# Patient Record
Sex: Male | Born: 1965 | Marital: Single | Smoking: Never smoker
Health system: Southern US, Community
[De-identification: ages and names within clinical notes are randomized; demographics above are authoritative.]

## PROBLEM LIST (undated history)

## (undated) DIAGNOSIS — I10 Essential (primary) hypertension: Secondary | ICD-10-CM

## (undated) DIAGNOSIS — E119 Type 2 diabetes mellitus without complications: Secondary | ICD-10-CM

## (undated) DIAGNOSIS — E785 Hyperlipidemia, unspecified: Secondary | ICD-10-CM

## (undated) DIAGNOSIS — F79 Unspecified intellectual disabilities: Secondary | ICD-10-CM

---

## 2008-03-02 ENCOUNTER — Ambulatory Visit: Payer: Self-pay | Admitting: Family Medicine

## 2015-09-21 ENCOUNTER — Emergency Department: Payer: Medicaid Other

## 2015-09-21 ENCOUNTER — Inpatient Hospital Stay
Admission: EM | Admit: 2015-09-21 | Discharge: 2015-10-09 | DRG: 853 | Disposition: A | Discharge status: Home Health | Payer: Medicaid Other | Attending: Specialist | Admitting: Specialist

## 2015-09-21 ENCOUNTER — Encounter: Payer: Self-pay | Admitting: Emergency Medicine

## 2015-09-21 DIAGNOSIS — E872 Acidosis: Secondary | ICD-10-CM | POA: Diagnosis present

## 2015-09-21 DIAGNOSIS — E222 Syndrome of inappropriate secretion of antidiuretic hormone: Secondary | ICD-10-CM | POA: Diagnosis present

## 2015-09-21 DIAGNOSIS — Z794 Long term (current) use of insulin: Secondary | ICD-10-CM

## 2015-09-21 DIAGNOSIS — R739 Hyperglycemia, unspecified: Secondary | ICD-10-CM

## 2015-09-21 DIAGNOSIS — J90 Pleural effusion, not elsewhere classified: Secondary | ICD-10-CM

## 2015-09-21 DIAGNOSIS — Z09 Encounter for follow-up examination after completed treatment for conditions other than malignant neoplasm: Secondary | ICD-10-CM

## 2015-09-21 DIAGNOSIS — Z880 Allergy status to penicillin: Secondary | ICD-10-CM

## 2015-09-21 DIAGNOSIS — E860 Dehydration: Secondary | ICD-10-CM | POA: Diagnosis present

## 2015-09-21 DIAGNOSIS — Z8249 Family history of ischemic heart disease and other diseases of the circulatory system: Secondary | ICD-10-CM

## 2015-09-21 DIAGNOSIS — I10 Essential (primary) hypertension: Secondary | ICD-10-CM | POA: Diagnosis present

## 2015-09-21 DIAGNOSIS — E871 Hypo-osmolality and hyponatremia: Secondary | ICD-10-CM | POA: Insufficient documentation

## 2015-09-21 DIAGNOSIS — E1165 Type 2 diabetes mellitus with hyperglycemia: Secondary | ICD-10-CM | POA: Diagnosis present

## 2015-09-21 DIAGNOSIS — I248 Other forms of acute ischemic heart disease: Secondary | ICD-10-CM | POA: Diagnosis present

## 2015-09-21 DIAGNOSIS — Z79899 Other long term (current) drug therapy: Secondary | ICD-10-CM

## 2015-09-21 DIAGNOSIS — F79 Unspecified intellectual disabilities: Secondary | ICD-10-CM

## 2015-09-21 DIAGNOSIS — Z833 Family history of diabetes mellitus: Secondary | ICD-10-CM

## 2015-09-21 DIAGNOSIS — J918 Pleural effusion in other conditions classified elsewhere: Secondary | ICD-10-CM | POA: Diagnosis present

## 2015-09-21 DIAGNOSIS — Z9889 Other specified postprocedural states: Secondary | ICD-10-CM

## 2015-09-21 DIAGNOSIS — J189 Pneumonia, unspecified organism: Secondary | ICD-10-CM | POA: Insufficient documentation

## 2015-09-21 DIAGNOSIS — K5900 Constipation, unspecified: Secondary | ICD-10-CM | POA: Diagnosis not present

## 2015-09-21 DIAGNOSIS — J869 Pyothorax without fistula: Secondary | ICD-10-CM | POA: Insufficient documentation

## 2015-09-21 DIAGNOSIS — E876 Hypokalemia: Secondary | ICD-10-CM | POA: Diagnosis not present

## 2015-09-21 DIAGNOSIS — E11649 Type 2 diabetes mellitus with hypoglycemia without coma: Secondary | ICD-10-CM | POA: Diagnosis not present

## 2015-09-21 DIAGNOSIS — A419 Sepsis, unspecified organism: Secondary | ICD-10-CM | POA: Diagnosis present

## 2015-09-21 HISTORY — DX: Unspecified intellectual disabilities: F79

## 2015-09-21 HISTORY — DX: Type 2 diabetes mellitus without complications: E11.9

## 2015-09-21 HISTORY — DX: Essential (primary) hypertension: I10

## 2015-09-21 LAB — BASIC METABOLIC PANEL
Anion gap: 19 — ABNORMAL HIGH (ref 5–15)
BUN: 29 mg/dL — ABNORMAL HIGH (ref 6–20)
CHLORIDE: 90 mmol/L — AB (ref 101–111)
CO2: 18 mmol/L — ABNORMAL LOW (ref 22–32)
CREATININE: 1.13 mg/dL (ref 0.61–1.24)
Calcium: 9.1 mg/dL (ref 8.9–10.3)
Glucose, Bld: 340 mg/dL — ABNORMAL HIGH (ref 65–99)
POTASSIUM: 4.9 mmol/L (ref 3.5–5.1)
SODIUM: 127 mmol/L — AB (ref 135–145)

## 2015-09-21 LAB — URINALYSIS COMPLETE WITH MICROSCOPIC (ARMC ONLY)
Bilirubin Urine: NEGATIVE
Glucose, UA: >500 mg/dL — AB
LEUKOCYTES UA: NEGATIVE
NITRITE: NEGATIVE
PH: 6 (ref 5.0–8.0)
PROTEIN: 100 mg/dL — AB
SPECIFIC GRAVITY, URINE: 1.027 (ref 1.005–1.030)
Squamous Epithelial / LPF: NONE SEEN

## 2015-09-21 LAB — CBC
HEMATOCRIT: 40.3 % (ref 40.0–52.0)
Hemoglobin: 13.3 g/dL (ref 13.0–18.0)
MCH: 29.2 pg (ref 26.0–34.0)
MCHC: 32.9 g/dL (ref 32.0–36.0)
MCV: 88.8 fL (ref 80.0–100.0)
Platelets: 367 10*3/uL (ref 150–440)
RBC: 4.54 MIL/uL (ref 4.40–5.90)
RDW: 12.9 % (ref 11.5–14.5)
WBC: 14.9 10*3/uL — AB (ref 3.8–10.6)

## 2015-09-21 LAB — HEPATIC FUNCTION PANEL
ALBUMIN: 3.6 g/dL (ref 3.5–5.0)
ALT: 34 U/L (ref 17–63)
AST: 41 U/L (ref 15–41)
Alkaline Phosphatase: 69 U/L (ref 38–126)
BILIRUBIN DIRECT: 0.3 mg/dL (ref 0.1–0.5)
BILIRUBIN TOTAL: 1.1 mg/dL (ref 0.3–1.2)
Indirect Bilirubin: 0.8 mg/dL (ref 0.3–0.9)
Total Protein: 8.5 g/dL — ABNORMAL HIGH (ref 6.5–8.1)

## 2015-09-21 LAB — PROTIME-INR
INR: 1.18
Prothrombin Time: 15.2 seconds — ABNORMAL HIGH (ref 11.4–15.0)

## 2015-09-21 LAB — GLUCOSE, CAPILLARY: GLUCOSE-CAPILLARY: 321 mg/dL — AB (ref 65–99)

## 2015-09-21 LAB — LIPASE, BLOOD: Lipase: 12 U/L (ref 11–51)

## 2015-09-21 LAB — LACTIC ACID, PLASMA
Lactic Acid, Venous: 3.3 mmol/L (ref 0.5–2.0)
Lactic Acid, Venous: 2.6 mmol/L (ref 0.5–2.0)

## 2015-09-21 LAB — BRAIN NATRIURETIC PEPTIDE: B Natriuretic Peptide: 81 pg/mL (ref 0.0–100.0)

## 2015-09-21 LAB — RAPID INFLUENZA A&B ANTIGENS (ARMC ONLY): INFLUENZA A (ARMC): NEGATIVE

## 2015-09-21 LAB — RAPID INFLUENZA A&B ANTIGENS: Influenza B (ARMC): NEGATIVE

## 2015-09-21 LAB — TROPONIN I: Troponin I: 0.26 ng/mL — ABNORMAL HIGH (ref <0.031)

## 2015-09-21 MED ORDER — HYDROMORPHONE HCL 1 MG/ML IJ SOLN
0.5000 mg | Freq: Once | INTRAMUSCULAR | Status: AC
  Administered 2015-09-21: 0.5 mg via INTRAVENOUS

## 2015-09-21 MED ORDER — SODIUM CHLORIDE 0.9 % IV BOLUS (SEPSIS)
1000.0000 mL | Freq: Once | INTRAVENOUS | Status: AC
  Administered 2015-09-21: 1000 mL via INTRAVENOUS

## 2015-09-21 MED ORDER — ONDANSETRON HCL 4 MG/2ML IJ SOLN
4.0000 mg | Freq: Once | INTRAMUSCULAR | Status: AC
  Administered 2015-09-21: 4 mg via INTRAVENOUS
  Filled 2015-09-21: qty 2

## 2015-09-21 MED ORDER — ASPIRIN 81 MG PO CHEW
324.0000 mg | CHEWABLE_TABLET | Freq: Once | ORAL | Status: AC
  Administered 2015-09-21: 324 mg via ORAL
  Filled 2015-09-21: qty 4

## 2015-09-21 MED ORDER — MORPHINE SULFATE (PF) 4 MG/ML IV SOLN
4.0000 mg | Freq: Once | INTRAVENOUS | Status: AC
  Administered 2015-09-21: 4 mg via INTRAVENOUS
  Filled 2015-09-21: qty 1

## 2015-09-21 MED ORDER — LEVOFLOXACIN IN D5W 750 MG/150ML IV SOLN
750.0000 mg | Freq: Once | INTRAVENOUS | Status: AC
  Administered 2015-09-21: 750 mg via INTRAVENOUS
  Filled 2015-09-21: qty 150

## 2015-09-21 MED ORDER — ACETAMINOPHEN 500 MG PO TABS
500.0000 mg | ORAL_TABLET | Freq: Once | ORAL | Status: AC
  Administered 2015-09-21: 500 mg via ORAL
  Filled 2015-09-21: qty 1

## 2015-09-21 MED ORDER — IOPAMIDOL (ISOVUE-370) INJECTION 76%
100.0000 mL | Freq: Once | INTRAVENOUS | Status: AC | PRN
  Administered 2015-09-21: 100 mL via INTRAVENOUS

## 2015-09-21 MED ORDER — DIATRIZOATE MEGLUMINE & SODIUM 66-10 % PO SOLN
15.0000 mL | Freq: Once | ORAL | Status: AC
  Administered 2015-09-21: 15 mL via ORAL

## 2015-09-21 MED ORDER — HYDROMORPHONE HCL 1 MG/ML IJ SOLN
INTRAMUSCULAR | Status: AC
  Administered 2015-09-21: 0.5 mg via INTRAVENOUS
  Filled 2015-09-21: qty 1

## 2015-09-21 MED ORDER — VANCOMYCIN HCL IN DEXTROSE 1-5 GM/200ML-% IV SOLN
1000.0000 mg | Freq: Once | INTRAVENOUS | Status: AC
  Administered 2015-09-21: 1000 mg via INTRAVENOUS
  Filled 2015-09-21: qty 200

## 2015-09-21 NOTE — H&P (Addendum)
[facility] [place] [facility]   PATIENT NAME: Sean [name]    MR#:  [id number]  DATE OF BIRTH:  April 02, 1966  DATE OF ADMISSION:  09/21/2015  PRIMARY CARE PHYSICIAN: Emogene MorganAYCOCK, NGWE A, MD   REQUESTING/REFERRING PHYSICIAN: Governor Rooksebecca Lord, MD  CHIEF COMPLAINT:   Chief Complaint  Patient presents with  . Abdominal Pain    HISTORY OF PRESENT ILLNESS:  Sean [name]  is a 50 y.o. male with a known history of Hypertension and diabetes and mental retardation. The patient cannot provide any information. According to his brother, who was living with him, the patient got a flu from her sister 2 days ago. He started to have a fever, chills, cough with sputum, shortness of breath and chest pain. The patient is originally from [place], has been living here for 9 years. No recent history of traveling abroad. He was found fever with temperature 101-102, tachycardia at about 120 and leukocytosis 14.9. Chest x-ray show left sided pneumonia with cavity. He is treated with antibiotics in the ED. Since troponin is elevated at 0.26, he was treated with aspirin 1 dose in ED.  PAST MEDICAL HISTORY:   Past Medical History  Diagnosis Date  . Diabetes mellitus without complication (HCC)   . Hypertension   . Mentally disabled     PAST SURGICAL HISTORY:  History reviewed. No pertinent past surgical history. No surgical history.  SOCIAL HISTORY:   Social History  Substance Use Topics  . Smoking status: Never Smoker   . Smokeless tobacco: Not on file  . Alcohol Use: No    FAMILY HISTORY:   Family History  Problem Relation Age of Onset  . Hypertension Mother   . Diabetes Mother   . Hypertension Father   . Diabetes Father     DRUG ALLERGIES:   Allergies  Allergen Reactions  . Penicillins Other (See Comments)    Reaction:  Unknown     REVIEW OF SYSTEMS:  Unable to get ROS due to MR.  MEDICATIONS AT HOME:   Prior to Admission medications   Medication Sig Start  Date End Date Taking? Authorizing Provider  atorvastatin (LIPITOR) 20 MG tablet Take 20 mg by mouth every evening.   Yes Historical Provider, MD  insulin glargine (LANTUS) 100 UNIT/ML injection Inject 36 Units into the skin at bedtime.   Yes Historical Provider, MD  lisinopril (PRINIVIL,ZESTRIL) 10 MG tablet Take 10 mg by mouth daily.   Yes Historical Provider, MD  metFORMIN (GLUCOPHAGE) 1000 MG tablet Take 1,000 mg by mouth 2 (two) times daily with a meal.   Yes Historical Provider, MD      VITAL SIGNS:  Blood pressure 151/106, pulse 120, temperature 98.3 F (36.8 C), temperature source Oral, resp. rate 16, height 5\' 6"  (1.676 m), weight 57 kg (125 lb 10.6 oz), SpO2 91 %.  PHYSICAL EXAMINATION:  GENERAL:  50 y.o.-year-old patient lying in the bed with no acute distress. Lethargic and thin. EYES: Pupils equal, round, reactive to light and accommodation. No scleral icterus. Extraocular muscles intact.  HEENT: Head atraumatic, normocephalic. Oropharynx and nasopharynx clear. Dry oral mucosa. NECK:  Supple, no jugular venous distention. No thyroid enlargement, no tenderness.  LUNGS: Normal breath sounds bilaterally, no wheezing, but has bilateral crackles. No use of accessory muscles of respiration.  CARDIOVASCULAR: S1, S2 normal. No murmurs, rubs, or gallops.  ABDOMEN: Soft, nontender, nondistended. Bowel sounds present. No organomegaly or mass.  EXTREMITIES: No pedal edema, cyanosis, or clubbing.  NEUROLOGIC: Cranial nerves  II through XII are intact. Muscle strength 5/5 in all extremities. Sensation intact. Gait not checked.  PSYCHIATRIC: The patient is awake but has mental retardation.Marland Kitchen  SKIN: No obvious rash, lesion, or ulcer.   LABORATORY PANEL:   CBC  Recent Labs Lab 09/21/15 1319  WBC 14.9*  HGB 13.3  HCT 40.3  PLT 367   ------------------------------------------------------------------------------------------------------------------  Chemistries   Recent Labs Lab  09/21/15 1319  NA 127*  K 4.9  CL 90*  CO2 18*  GLUCOSE 340*  BUN 29*  CREATININE 1.13  CALCIUM 9.1  AST 41  ALT 34  ALKPHOS 69  BILITOT 1.1   ------------------------------------------------------------------------------------------------------------------  Cardiac Enzymes  Recent Labs Lab 09/21/15 1319  TROPONINI 0.26*   ------------------------------------------------------------------------------------------------------------------  RADIOLOGY:  Dg Chest 2 View  09/21/2015  CLINICAL DATA:  Left rib pain without reported injury. EXAM: CHEST  2 VIEW COMPARISON:  None. FINDINGS: The heart size and mediastinal contours are within normal limits. Right lung is clear. Mild to moderate left pleural effusion is noted with probable underlying atelectasis or infiltrate. The visualized skeletal structures are unremarkable. IMPRESSION: Mild to moderate left pleural effusion with probable underlying atelectasis or infiltrate. Electronically Signed   By: Lupita Raider, M.D.   On: 09/21/2015 15:03   Ct Angio Chest Pe W/cm &/or Wo Cm  09/21/2015  CLINICAL DATA:  Left-sided chest pain and shortness of breath. Left abdominal pain. EXAM: CT ANGIOGRAPHY CHEST CT ABDOMEN AND PELVIS WITH CONTRAST TECHNIQUE: Multidetector CT imaging of the chest was performed using the standard protocol during bolus administration of intravenous contrast. Multiplanar CT image reconstructions and MIPs were obtained to evaluate the vascular anatomy. Multidetector CT imaging of the abdomen and pelvis was performed using the standard protocol during bolus administration of intravenous contrast. CONTRAST:  100 cc Isovue 370 intravenous COMPARISON:  None. FINDINGS: CTA CHEST FINDINGS THORACIC INLET/BODY WALL: Subcutaneous reticulation in the left upper back is likely scarring. MEDIASTINUM: Normal heart size. No pericardial effusion. Atherosclerosis with notable proximal left subclavian artery stenosis due to low-density plaque  with positive remodeling. Stenosis measures 75% when narrowing as compared to downstream vessel lumen. 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 When accounting for intermittent motion and streak artifact there is no convincing pulmonary embolism. LUNG WINDOWS: Small left pleural effusion with loculation causing scalloping and fissural extension. There is opacification of the lingula and basilar left lower lobe with heterogeneous enhancement and the anterior basilar segment where there is air-filled cavities. No pleural nodularity or enhancement. OSSEOUS: No acute finding. CT ABDOMEN and PELVIS FINDINGS Abdominal wall:  No contributory findings. Hepatobiliary: No focal liver abnormality.No evidence of biliary obstruction or stone. Pancreas: Unremarkable. Spleen: Unremarkable. Adrenals/Urinary Tract: Prominent adrenal thickness enhancement without nodule. No hydronephrosis or stone. Distended bladder without wall thickening. Reproductive:Vas calcifications correlate with diabetes history. Stomach/Bowel:  No obstruction. No inflammation. Vascular/Lymphatic: Advanced for age aortic and iliac atherosclerosis. No acute vascular abnormality. No mass or adenopathy. Peritoneal: No ascites or pneumoperitoneum. Musculoskeletal: No acute abnormalities. Review of the MIP images confirms the above findings. IMPRESSION: 1. Cavitating left basilar pneumonia. Small to moderate parapneumonic effusion that is partially loculated. 2. Motion degraded CTA without evidence of pulmonary embolism. 3. Distended bladder, correlate for outlet obstruction. 4. Advanced for age atherosclerosis with high-grade proximal left subclavian artery stenosis. 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 Electronically Signed   By: Marnee Spring M.D.   On: 09/21/2015 16:58   Ct Abdomen Pelvis W Contrast  09/21/2015  CLINICAL DATA:  Left-sided chest  pain and shortness of breath. Left abdominal pain. EXAM: CT ANGIOGRAPHY CHEST CT ABDOMEN AND  PELVIS WITH CONTRAST TECHNIQUE: Multidetector CT imaging of the chest was performed using the standard protocol during bolus administration of intravenous contrast. Multiplanar CT image reconstructions and MIPs were obtained to evaluate the vascular anatomy. Multidetector CT imaging of the abdomen and pelvis was performed using the standard protocol during bolus administration of intravenous contrast. CONTRAST:  100 cc Isovue 370 intravenous COMPARISON:  None. FINDINGS: CTA CHEST FINDINGS THORACIC INLET/BODY WALL: Subcutaneous reticulation in the left upper back is likely scarring. MEDIASTINUM: Normal heart size. No pericardial effusion. Atherosclerosis with notable proximal left subclavian artery stenosis due to low-density plaque with positive remodeling. Stenosis measures 75% when narrowing as compared to downstream vessel lumen. 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 When accounting for intermittent motion and streak artifact there is no convincing pulmonary embolism. LUNG WINDOWS: Small left pleural effusion with loculation causing scalloping and fissural extension. There is opacification of the lingula and basilar left lower lobe with heterogeneous enhancement and the anterior basilar segment where there is air-filled cavities. No pleural nodularity or enhancement. OSSEOUS: No acute finding. CT ABDOMEN and PELVIS FINDINGS Abdominal wall:  No contributory findings. Hepatobiliary: No focal liver abnormality.No evidence of biliary obstruction or stone. Pancreas: Unremarkable. Spleen: Unremarkable. Adrenals/Urinary Tract: Prominent adrenal thickness enhancement without nodule. No hydronephrosis or stone. Distended bladder without wall thickening. Reproductive:Vas calcifications correlate with diabetes history. Stomach/Bowel:  No obstruction. No inflammation. Vascular/Lymphatic: Advanced for age aortic and iliac atherosclerosis. No acute vascular abnormality. No mass or adenopathy. Peritoneal: No ascites  or pneumoperitoneum. Musculoskeletal: No acute abnormalities. Review of the MIP images confirms the above findings. IMPRESSION: 1. Cavitating left basilar pneumonia. Small to moderate parapneumonic effusion that is partially loculated. 2. Motion degraded CTA without evidence of pulmonary embolism. 3. Distended bladder, correlate for outlet obstruction. 4. Advanced for age atherosclerosis with high-grade proximal left subclavian artery stenosis. 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 Electronically Signed   By: Marnee Spring M.D.   On: 09/21/2015 16:58    EKG:   Orders placed or performed during the hospital encounter of 09/21/15  . EKG 12-Lead  . EKG 12-Lead  . ED EKG within 10 minutes  . ED EKG within 10 minutes    IMPRESSION AND PLAN:   Sepsis with pneumonia (leukocytosis, tachycardia and fever) The patient will be admitted to medical floor. I will start sepsis protocol, continue vancomycin and Levaquin, pharmacy to dose. Follow-up blood culture and sputum culture, start nebulizer when necessary. Tinea oxygen and it cannula. Influenza PCR.  Lactic acidosis. Follow-up lactic acid level and supportive care. Elevated troponin. Possible due to demanding ischemia, follow-up troponin level, start aspirin and continue statin. Dehydration. Start normal saline IV and follow-up BMP. Hyponatremia. Continue normal saline IV and follow-up BMP. Diabetes. Start sliding scale and hold metformin. Hypertension. Continue lisinopril. Mental retardation.  All the records are reviewed and case discussed with ED provider. Management plans discussed with the patient's 2  brothers, next of kin, and he is in agreement. Patient has no POA.  CODE STATUS: Full code  TOTAL TIME TAKING CARE OF THIS PATIENT: 58 minutes.    Shaune Pollack M.D on 09/21/2015 at 5:58 PM  Between 7am to 6pm - Pager - [phone]  After 6pm go to [email or web address] - password [email or web address]  [facility] [place] [facility]  Office   [phone]  CC: Primary care physician; Emogene Morgan, MD

## 2015-09-21 NOTE — ED Notes (Signed)
Pt incontinent of urine. Bedding changed and pt cleaned

## 2015-09-21 NOTE — ED Provider Notes (Signed)
[facility] Emergency Department Provider Note   ____________________________________________  Time seen: Approximately 15 p.m. I have reviewed the triage vital signs and the triage nursing note.  HISTORY  Chief Complaint Abdominal Pain   Historian Limited history from the patient as he speaks URDI and has underlying mental retardation History is provided from the brother as well as a sister. Brother speaks excellent [place]. He states that he would be providing the history even if there was an interpreter, because the patient does not have the mental capacity to provide his own history.  HPI Sean [name] is a 50 y.o. male who takes insulin at home for hyperglycemia, who is been ill for approximately 3 days. The last 2 days he has been reportedly febrile to subjective touch and also temperature taken 101-102.  He has had a cough, but nonproductive. No significant chest pain or shortness of breath described. He's been complaining of left-sided chest/lower rib pain. He's had left-sided abdominal pain. No vomiting or diarrhea. Significant decreased by mouth intake for the past 2-3 days.  Brother took him to urgent care today and due to ketones in the urine was sent to the ED for further evaluation for possible ketoacidosis and workup for febrile illness.  Symptoms are moderate to severe.    Past Medical History  Diagnosis Date  . Diabetes mellitus without complication (HCC)   . Hypertension   . Mentally disabled     Patient Active Problem List   Diagnosis Date Noted  . Sepsis (HCC) 09/22/2015  . Pneumonia   . Hyponatremia     History reviewed. No pertinent past surgical history.  No current outpatient prescriptions on file. insulin   Allergies Penicillins Her brother, the patient had reportedly been penicillin allergic as a child. Family History  Problem Relation Age of Onset  . Hypertension Mother   . Diabetes Mother   . Hypertension Father   .  Diabetes Father     Social History Social History  Substance Use Topics  . Smoking status: Never Smoker   . Smokeless tobacco: None  . Alcohol Use: No    Review of Systems  Constitutional: Positive for fever. Eyes: Negative for visual changes. ENT: Negative for sore throat. Cardiovascular: Positive for chest pain, left rib.Marland Kitchen. Respiratory: Positive for shortness of breath. Gastrointestinal: Positive for abdominal pain, but negative for vomiting and diarrhea. Genitourinary: Negative for dysuria. Musculoskeletal: Positive for back pain in the left upper posterior area of the chest/back. Skin: Negative for rash. Neurological: Negative for headache. 10 point Review of Systems otherwise negative ____________________________________________   PHYSICAL EXAM:  VITAL SIGNS: ED Triage Vitals  Enc Vitals Group     BP 09/21/15 1306 173/62 mmHg     Pulse Rate 09/21/15 1306 109     Resp 09/21/15 1306 16     Temp 09/21/15 1306 98.3 F (36.8 C)     Temp Source 09/21/15 1306 Oral     SpO2 09/21/15 1306 98 %     Weight 09/21/15 1306 125 lb 10.6 oz (57 kg)     Height 09/21/15 1306 5\' 6"  (1.676 m)     Head Cir --      Peak Flow --      Pain Score 09/21/15 1309 8     Pain Loc --      Pain Edu? --      Excl. in GC? --      Constitutional: Alert and awake and verbal, but not speaking the language. He does  answer questions for his brother, and he can cooperate to follow commands. Somewhat yellow appearance, which may actually be more anemic than jaundice.  HEENT   Head: Microcephalic and atraumatic.      Eyes: Conjunctivae are normal. PERRL. Normal extraocular movements.      Ears:         Nose: No congestion/rhinnorhea.   Mouth/Throat: Mucous membranes are mildly dry.   Neck: No stridor. Cardiovascular/Chest: Tachycardic, regular rhythm.  No murmurs, rubs, or gallops. Respiratory: Normal respiratory effort without tachypnea nor retractions. Decreased breath sounds at  bilateral bases, mild rhonchi both bases. No wheezing. No rales.. Gastrointestinal: Soft. No distention, no guarding, no rebound. Mild to moderate tenderness left upper quadrant left-sided abdomen.  Genitourinary/rectal:Deferred Musculoskeletal: Nontender with normal range of motion in all extremities. No joint effusions.  No lower extremity tenderness.  No edema. Neurologic:  Alert and interactive. No gross or focal neurologic deficits are appreciated. Skin:  Skin is warm, dry and intact. No rash noted.   ____________________________________________   EKG I, Governor Rooks, MD, the attending physician have personally viewed and interpreted all ECGs.  110 bpm. Sinus tachycardia. Narrow QRS normal axis. Normal ST and T-wave ____________________________________________  LABS (pertinent positives/negatives)  Metabolic panel significant for sodium 127, chloride 90, CO2 18, glucose 348 BUN 29 with a creatinine of 1.13 White blood count 14.9, hemoglobin 13.3 and platelet count 367 Troponin 0.26   ____________________________________________  RADIOLOGY All Xrays were viewed by me. Imaging interpreted by Radiologist.  Chest 2 view:  IMPRESSION: Mild to moderate left pleural effusion with probable underlying atelectasis or infiltrate.  CT for PE and chest abdomen pelvis with contrast:  IMPRESSION: 1. Cavitating left basilar pneumonia. Small to moderate parapneumonic effusion that is partially loculated. 2. Motion degraded CTA without evidence of pulmonary embolism. 3. Distended bladder, correlate for outlet obstruction. 4. Advanced for age atherosclerosis with high-grade proximal left subclavian artery stenosis. 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 __________________________________________  PROCEDURES  Procedure(s) performed: None  Critical Care performed: CRITICAL CARE Performed by: Governor Rooks   Total critical care time: 30 minutes  Critical care time was  exclusive of separately billable procedures and treating other patients.  Critical care was necessary to treat or prevent imminent or life-threatening deterioration.  Critical care was time spent personally by me on the following activities: development of treatment plan with patient and/or surrogate as well as nursing, discussions with consultants, evaluation of patient's response to treatment, examination of patient, obtaining history from patient or surrogate, ordering and performing treatments and interventions, ordering and review of laboratory studies, ordering and review of radiographic studies, pulse oximetry and re-evaluation of patient's condition.   ____________________________________________   ED COURSE / ASSESSMENT AND PLAN  Pertinent labs & imaging results that were available during my care of the patient were reviewed by me and considered in my medical decision making (see chart for details).   Father provides a pretty excellent history as he is a caregiver for his mentally handicapped brother, the patient today.  Family he's had a febrile possibly pulmonary illness over the last several days, culminating with severe/10 out of 10 pain on his left side including the chest and abdomen.  They had problems with elevated blood sugars for the past 2 days as well as he takes insulin for diabetes.  He's not febrile here, but there has been reportedly fevers at home, and I am concerned about sepsis, especially with elevated white blood cell count and left pleural effusion versus  infiltrate on chest x-ray. Patient will be started on antibiotics for broad-spectrum coverage at this point in time.  I don't mentally antibiotics in waiting for additional workup including urine and chest/abdomen pelvis CT.  It's unclear to me at this point if his lungs really indicating DKA with his anion gap of 19, versus lactic acidosis. I am still awaiting lactate. His sodium is 127, but this could be  pseudohyponatremia with the elevated blood glucose.  I am to start with 2 L normal saline bolus.   His troponin came back 0.2, however I am most suspicious of demand ischemia, and until seeing the chest abdomen pelvis CT, I did hold off on aspirin just in case there is some sort of surgical emergency for which the patient does not need to be anticoagulated this point in time.  EKG does not show acute ischemic findings.  CT scan consistent with pneumonia, and patient given antibiotics for concern for sepsis.  CONSULTATIONS:   Hospitalist for admission   Patient / Family / Caregiver informed of clinical course, medical decision-making process, and agree with plan.      ___________________________________________   FINAL CLINICAL IMPRESSION(S) / ED DIAGNOSES   Final diagnoses:  Hyponatremia  Pneumonia involving left lung, unspecified part of lung  Hyperglycemia              Note: This dictation was prepared with Dragon dictation. Any transcriptional errors that result from this process are unintentional   Governor Rooks, MD 09/24/15 1816

## 2015-09-21 NOTE — ED Notes (Signed)
While helping Pt toilet, noted skin hot to toouch. Checked temp = 100.8

## 2015-09-21 NOTE — Consult Note (Signed)
Pharmacy Antibiotic Note  My Sean [name] is a 50 y.o. male admitted on 09/21/2015 with pneumonia.  Pharmacy has been consulted for levofloxacin and vancomycin dosing.  Plan:Pt received 1g of vancomycin in the ED at 1550 and 750mg  of levofloxacin in ED. Will start vancomycin 750mg  q 12 hours, starting 7 hours after initial dose for stacked dosing. Vancomycin 750 IV every 12 hours.  Goal trough 15-20 mcg/mL.  Height: 5\' 6"  (167.6 cm) Weight: 125 lb 10.6 oz (57 kg) IBW/kg (Calculated) : 63.8  Temp (24hrs), Avg:98.6 F (37 C), Min:98.3 F (36.8 C), Max:98.9 F (37.2 C)   Recent Labs Lab 09/21/15 1319 09/21/15 1534 09/21/15 1813  WBC 14.9*  --   --   CREATININE 1.13  --   --   LATICACIDVEN  --  3.3* 2.6*    Estimated Creatinine Clearance: 63.8 mL/min (by C-G formula based on Cr of 1.13).    Allergies  Allergen Reactions  . Penicillins Other (See Comments)    Reaction:  Unknown     Antimicrobials this admission: levofloxacin 4/4 >>  vancomycin 4/4 >>   Dose adjustments this admission:   Microbiology results:  4/4 UCx: pending  4/4 Sputum: needs to be collected Influenza PCR: pending PCT:   Thank you for allowing pharmacy to be a part of this patient's care.  Olene FlossMelissa D Alina Gilkey, Pharm.D Clinical Pharmacist   09/21/2015 7:06 PM

## 2015-09-21 NOTE — ED Notes (Signed)
Notified of elevated lactic acid 3.3. MD made aware

## 2015-09-21 NOTE — ED Notes (Signed)
After being brought back from CT, Oxygen was 79%. Placed on 4L

## 2015-09-21 NOTE — ED Notes (Signed)
[phone] (interpreter #)  Patient presents to the ED via EMS from [facility] with left rib pain, left shoulder pain and back pain.  Patient speaks Urdu but has some mental delays and patient gave family permission to interpret for him while I was on the line with the Urdu interpreter.  Family does not have gaurdianship of patient currently.  Patient had ketones in his urine at [facility].  Patient has a history of diabetes and hypertension.

## 2015-09-22 ENCOUNTER — Inpatient Hospital Stay: Payer: Medicaid Other

## 2015-09-22 DIAGNOSIS — E1165 Type 2 diabetes mellitus with hyperglycemia: Secondary | ICD-10-CM | POA: Diagnosis present

## 2015-09-22 DIAGNOSIS — J869 Pyothorax without fistula: Secondary | ICD-10-CM | POA: Diagnosis not present

## 2015-09-22 DIAGNOSIS — E876 Hypokalemia: Secondary | ICD-10-CM | POA: Diagnosis not present

## 2015-09-22 DIAGNOSIS — J90 Pleural effusion, not elsewhere classified: Secondary | ICD-10-CM | POA: Diagnosis not present

## 2015-09-22 DIAGNOSIS — I248 Other forms of acute ischemic heart disease: Secondary | ICD-10-CM | POA: Diagnosis present

## 2015-09-22 DIAGNOSIS — J189 Pneumonia, unspecified organism: Secondary | ICD-10-CM

## 2015-09-22 DIAGNOSIS — E871 Hypo-osmolality and hyponatremia: Secondary | ICD-10-CM | POA: Diagnosis present

## 2015-09-22 DIAGNOSIS — F79 Unspecified intellectual disabilities: Secondary | ICD-10-CM | POA: Diagnosis present

## 2015-09-22 DIAGNOSIS — Z833 Family history of diabetes mellitus: Secondary | ICD-10-CM | POA: Diagnosis not present

## 2015-09-22 DIAGNOSIS — Z79899 Other long term (current) drug therapy: Secondary | ICD-10-CM | POA: Diagnosis not present

## 2015-09-22 DIAGNOSIS — Z8249 Family history of ischemic heart disease and other diseases of the circulatory system: Secondary | ICD-10-CM | POA: Diagnosis not present

## 2015-09-22 DIAGNOSIS — A419 Sepsis, unspecified organism: Secondary | ICD-10-CM | POA: Diagnosis present

## 2015-09-22 DIAGNOSIS — E872 Acidosis: Secondary | ICD-10-CM | POA: Diagnosis present

## 2015-09-22 DIAGNOSIS — E860 Dehydration: Secondary | ICD-10-CM | POA: Diagnosis present

## 2015-09-22 DIAGNOSIS — E222 Syndrome of inappropriate secretion of antidiuretic hormone: Secondary | ICD-10-CM | POA: Diagnosis present

## 2015-09-22 DIAGNOSIS — E11649 Type 2 diabetes mellitus with hypoglycemia without coma: Secondary | ICD-10-CM | POA: Diagnosis not present

## 2015-09-22 DIAGNOSIS — J918 Pleural effusion in other conditions classified elsewhere: Secondary | ICD-10-CM | POA: Diagnosis present

## 2015-09-22 DIAGNOSIS — Z794 Long term (current) use of insulin: Secondary | ICD-10-CM | POA: Diagnosis not present

## 2015-09-22 DIAGNOSIS — Z880 Allergy status to penicillin: Secondary | ICD-10-CM | POA: Diagnosis not present

## 2015-09-22 DIAGNOSIS — K5900 Constipation, unspecified: Secondary | ICD-10-CM | POA: Diagnosis not present

## 2015-09-22 DIAGNOSIS — I10 Essential (primary) hypertension: Secondary | ICD-10-CM | POA: Diagnosis present

## 2015-09-22 LAB — INFLUENZA PANEL BY PCR (TYPE A & B)
H1N1 flu by pcr: NOT DETECTED
Influenza A By PCR: NEGATIVE
Influenza B By PCR: NEGATIVE

## 2015-09-22 LAB — BLOOD GAS, VENOUS
Acid-base deficit: 8.9 mmol/L — ABNORMAL HIGH (ref 0.0–2.0)
BICARBONATE: 16.8 meq/L — AB (ref 21.0–28.0)
FIO2: 0.21
PH VEN: 7.29 — AB (ref 7.320–7.430)
Patient temperature: 37
pCO2, Ven: 35 mmHg — ABNORMAL LOW (ref 44.0–60.0)

## 2015-09-22 LAB — CBC
HCT: 36 % — ABNORMAL LOW (ref 40.0–52.0)
HEMOGLOBIN: 12 g/dL — AB (ref 13.0–18.0)
MCH: 29.3 pg (ref 26.0–34.0)
MCHC: 33.3 g/dL (ref 32.0–36.0)
MCV: 87.8 fL (ref 80.0–100.0)
PLATELETS: 327 10*3/uL (ref 150–440)
RBC: 4.1 MIL/uL — AB (ref 4.40–5.90)
RDW: 12.9 % (ref 11.5–14.5)
WBC: 20.5 10*3/uL — ABNORMAL HIGH (ref 3.8–10.6)

## 2015-09-22 LAB — BASIC METABOLIC PANEL
Anion gap: 9 (ref 5–15)
BUN: 20 mg/dL (ref 6–20)
CHLORIDE: 99 mmol/L — AB (ref 101–111)
CO2: 19 mmol/L — AB (ref 22–32)
Calcium: 7.6 mg/dL — ABNORMAL LOW (ref 8.9–10.3)
Creatinine, Ser: 0.82 mg/dL (ref 0.61–1.24)
GFR calc Af Amer: >60 mL/min (ref >60)
GFR calc non Af Amer: >60 mL/min (ref >60)
GLUCOSE: 264 mg/dL — AB (ref 65–99)
POTASSIUM: 4.5 mmol/L (ref 3.5–5.1)
Sodium: 127 mmol/L — ABNORMAL LOW (ref 135–145)

## 2015-09-22 LAB — URINE CULTURE: CULTURE: NO GROWTH

## 2015-09-22 LAB — LACTIC ACID, PLASMA: Lactic Acid, Venous: 1.6 mmol/L (ref 0.5–2.0)

## 2015-09-22 LAB — GLUCOSE, CAPILLARY
GLUCOSE-CAPILLARY: 270 mg/dL — AB (ref 65–99)
GLUCOSE-CAPILLARY: 312 mg/dL — AB (ref 65–99)
GLUCOSE-CAPILLARY: 220 mg/dL — AB (ref 65–99)
GLUCOSE-CAPILLARY: 297 mg/dL — AB (ref 65–99)
Glucose-Capillary: 314 mg/dL — ABNORMAL HIGH (ref 65–99)
Glucose-Capillary: 349 mg/dL — ABNORMAL HIGH (ref 65–99)

## 2015-09-22 LAB — PROCALCITONIN: Procalcitonin: >154 ng/mL

## 2015-09-22 LAB — MRSA PCR SCREENING: MRSA BY PCR: NEGATIVE

## 2015-09-22 LAB — TROPONIN I: TROPONIN I: 0.08 ng/mL — AB (ref <0.031)

## 2015-09-22 MED ORDER — LEVOFLOXACIN IN D5W 750 MG/150ML IV SOLN
750.0000 mg | INTRAVENOUS | Status: AC
  Administered 2015-09-22 – 2015-10-04 (×13): 750 mg via INTRAVENOUS
  Filled 2015-09-22 (×13): qty 150

## 2015-09-22 MED ORDER — INSULIN ASPART 100 UNIT/ML ~~LOC~~ SOLN
0.0000 [IU] | Freq: Every day | SUBCUTANEOUS | Status: DC
  Administered 2015-09-22: 4 [IU] via SUBCUTANEOUS
  Administered 2015-09-23: 3 [IU] via SUBCUTANEOUS
  Administered 2015-09-26: 4 [IU] via SUBCUTANEOUS
  Administered 2015-09-28: 2 [IU] via SUBCUTANEOUS
  Administered 2015-09-29: 4 [IU] via SUBCUTANEOUS
  Administered 2015-09-30: 22:00:00 3 [IU] via SUBCUTANEOUS
  Administered 2015-10-01 – 2015-10-02 (×2): 2 [IU] via SUBCUTANEOUS
  Administered 2015-10-04: 23:00:00 3 [IU] via SUBCUTANEOUS
  Administered 2015-10-05: 2 [IU] via SUBCUTANEOUS
  Administered 2015-10-06: 23:00:00 3 [IU] via SUBCUTANEOUS
  Administered 2015-10-08: 4 [IU] via SUBCUTANEOUS
  Filled 2015-09-22: qty 2
  Filled 2015-09-22: qty 3
  Filled 2015-09-22: qty 2
  Filled 2015-09-22: qty 4
  Filled 2015-09-22: qty 3
  Filled 2015-09-22: qty 4
  Filled 2015-09-22: qty 3
  Filled 2015-09-22: qty 2
  Filled 2015-09-22: qty 3
  Filled 2015-09-22: qty 4
  Filled 2015-09-22: qty 2
  Filled 2015-09-22: qty 4
  Filled 2015-09-22: qty 2

## 2015-09-22 MED ORDER — ACETAMINOPHEN 500 MG PO TABS
1000.0000 mg | ORAL_TABLET | Freq: Four times a day (QID) | ORAL | Status: DC | PRN
  Administered 2015-09-22 – 2015-09-27 (×9): 1000 mg via ORAL
  Filled 2015-09-22 (×9): qty 2

## 2015-09-22 MED ORDER — INSULIN GLARGINE 100 UNIT/ML ~~LOC~~ SOLN
36.0000 [IU] | Freq: Every day | SUBCUTANEOUS | Status: DC
  Administered 2015-09-22: 36 [IU] via SUBCUTANEOUS
  Filled 2015-09-22 (×3): qty 0.36

## 2015-09-22 MED ORDER — METOPROLOL TARTRATE 1 MG/ML IV SOLN
5.0000 mg | Freq: Four times a day (QID) | INTRAVENOUS | Status: DC | PRN
  Administered 2015-09-22 – 2015-09-23 (×2): 5 mg via INTRAVENOUS
  Filled 2015-09-22 (×4): qty 5

## 2015-09-22 MED ORDER — OXYCODONE-ACETAMINOPHEN 5-325 MG PO TABS
1.0000 | ORAL_TABLET | Freq: Four times a day (QID) | ORAL | Status: DC | PRN
  Administered 2015-09-23 – 2015-10-07 (×23): 1 via ORAL
  Filled 2015-09-22 (×23): qty 1

## 2015-09-22 MED ORDER — LISINOPRIL 10 MG PO TABS
10.0000 mg | ORAL_TABLET | Freq: Every day | ORAL | Status: DC
  Administered 2015-09-22: 10 mg via ORAL
  Filled 2015-09-22: qty 1

## 2015-09-22 MED ORDER — ENOXAPARIN SODIUM 40 MG/0.4ML ~~LOC~~ SOLN
40.0000 mg | SUBCUTANEOUS | Status: DC
  Administered 2015-09-22: 40 mg via SUBCUTANEOUS
  Filled 2015-09-22 (×2): qty 0.4

## 2015-09-22 MED ORDER — INSULIN ASPART 100 UNIT/ML ~~LOC~~ SOLN
0.0000 [IU] | Freq: Three times a day (TID) | SUBCUTANEOUS | Status: DC
  Administered 2015-09-22: 8 [IU] via SUBCUTANEOUS
  Administered 2015-09-22: 11 [IU] via SUBCUTANEOUS
  Administered 2015-09-22: 5 [IU] via SUBCUTANEOUS
  Administered 2015-09-23: 3 [IU] via SUBCUTANEOUS
  Administered 2015-09-24: 2 [IU] via SUBCUTANEOUS
  Administered 2015-09-25 (×2): 5 [IU] via SUBCUTANEOUS
  Administered 2015-09-26 (×2): 3 [IU] via SUBCUTANEOUS
  Administered 2015-09-26: 8 [IU] via SUBCUTANEOUS
  Administered 2015-09-27: 11 [IU] via SUBCUTANEOUS
  Administered 2015-09-27: 5 [IU] via SUBCUTANEOUS
  Administered 2015-09-27 – 2015-09-29 (×4): 8 [IU] via SUBCUTANEOUS
  Administered 2015-09-30: 17:00:00 5 [IU] via SUBCUTANEOUS
  Administered 2015-09-30: 2 [IU] via SUBCUTANEOUS
  Administered 2015-09-30: 3 [IU] via SUBCUTANEOUS
  Administered 2015-10-01 (×2): 2 [IU] via SUBCUTANEOUS
  Administered 2015-10-01: 08:00:00 3 [IU] via SUBCUTANEOUS
  Administered 2015-10-02: 09:00:00 2 [IU] via SUBCUTANEOUS
  Administered 2015-10-02: 12:00:00 5 [IU] via SUBCUTANEOUS
  Administered 2015-10-02 – 2015-10-04 (×6): 3 [IU] via SUBCUTANEOUS
  Administered 2015-10-05: 11 [IU] via SUBCUTANEOUS
  Administered 2015-10-05: 12:00:00 3 [IU] via SUBCUTANEOUS
  Administered 2015-10-06: 5 [IU] via SUBCUTANEOUS
  Administered 2015-10-07: 18:00:00 2 [IU] via SUBCUTANEOUS
  Administered 2015-10-07: 12:00:00 5 [IU] via SUBCUTANEOUS
  Administered 2015-10-08 (×3): 3 [IU] via SUBCUTANEOUS
  Administered 2015-10-09: 12:00:00 2 [IU] via SUBCUTANEOUS
  Filled 2015-09-22: qty 2
  Filled 2015-09-22: qty 8
  Filled 2015-09-22: qty 3
  Filled 2015-09-22 (×2): qty 8
  Filled 2015-09-22: qty 3
  Filled 2015-09-22: qty 5
  Filled 2015-09-22: qty 11
  Filled 2015-09-22: qty 5
  Filled 2015-09-22 (×3): qty 3
  Filled 2015-09-22: qty 2
  Filled 2015-09-22: qty 3
  Filled 2015-09-22: qty 2
  Filled 2015-09-22: qty 11
  Filled 2015-09-22: qty 5
  Filled 2015-09-22: qty 2
  Filled 2015-09-22 (×2): qty 3
  Filled 2015-09-22: qty 5
  Filled 2015-09-22: qty 8
  Filled 2015-09-22: qty 11
  Filled 2015-09-22: qty 5
  Filled 2015-09-22 (×3): qty 3
  Filled 2015-09-22: qty 2
  Filled 2015-09-22: qty 8
  Filled 2015-09-22 (×2): qty 5
  Filled 2015-09-22: qty 3
  Filled 2015-09-22: qty 8
  Filled 2015-09-22: qty 2
  Filled 2015-09-22: qty 3
  Filled 2015-09-22: qty 8
  Filled 2015-09-22: qty 2
  Filled 2015-09-22 (×2): qty 3
  Filled 2015-09-22: qty 5

## 2015-09-22 MED ORDER — SODIUM CHLORIDE 0.9 % IV SOLN
INTRAVENOUS | Status: AC
  Administered 2015-09-22 (×2): via INTRAVENOUS

## 2015-09-22 MED ORDER — ATORVASTATIN CALCIUM 20 MG PO TABS
20.0000 mg | ORAL_TABLET | Freq: Every evening | ORAL | Status: DC
  Administered 2015-09-22 – 2015-09-27 (×6): 20 mg via ORAL
  Filled 2015-09-22 (×6): qty 1

## 2015-09-22 MED ORDER — VANCOMYCIN HCL IN DEXTROSE 750-5 MG/150ML-% IV SOLN
750.0000 mg | Freq: Two times a day (BID) | INTRAVENOUS | Status: DC
  Filled 2015-09-22 (×3): qty 150

## 2015-09-22 MED ORDER — METRONIDAZOLE IN NACL 5-0.79 MG/ML-% IV SOLN
500.0000 mg | Freq: Three times a day (TID) | INTRAVENOUS | Status: DC
  Administered 2015-09-22 – 2015-09-30 (×24): 500 mg via INTRAVENOUS
  Filled 2015-09-22 (×29): qty 100

## 2015-09-22 NOTE — Progress Notes (Signed)
  September 22, 2015  Patient: Sean [name]  Date of Birth: May 05, 1966  Date of Visit: 09/21/2015    To Whom It May Concern:  Lin Jamison was seen and treated in our emergency department on 09/21/2015 until present. Please excuse Boyce MediciSaif Weesner from work for 09/21/2015 until 09/23/2015.  Sincerely,  Audria NineAmanda Shawndell Schillaci RN

## 2015-09-22 NOTE — Progress Notes (Signed)
Notified Enedina FinnerSona Patel, MD of patient's HR. New order for metoprolol IV. Order placed for transfer to 2A, [facility]. Orderly called

## 2015-09-22 NOTE — Progress Notes (Signed)
Telemetry box removed and changed, called central spoke with stephanie with new box

## 2015-09-22 NOTE — Progress Notes (Signed)
To Whom It May Concern:  Savon Glahn was seen and treated in our emergency department on 09/20/2015 until present. Please excuse Boyce MediciSaif Chichester from work for 09/20/15 to 09/23/15.  Lucendia HerrlichJennifer Mayan Dolney, RN 09/22/15

## 2015-09-22 NOTE — Progress Notes (Signed)
Patient ID: Sean [name], male   DOB: [birth year], 50 y.o.   MRN: [id number]  Chief Complaint  Patient presents with  . Abdominal Pain    Referred By Dr. Tim Lair Reason for Referral Left pleural effusion  HPI Location, Quality, Duration, Severity, Timing, Context, Modifying Factors, Associated Signs and Symptoms.  Sean [name] is a 50 y.o. male.  The patient does not speak English and was unable to provide any history. He did appear to understand my questions but could not answer and in a manner I could interpret. Therefore this history is obtained from the chart. Apparently the patient was admitted to the emergency department yesterday after he presented with a history of chest pain fevers chills or shortness of breath. A chest x-ray showed the presence of a left lower lobe pneumonia with possible pleural effusion. A CT scan confirmed that and also showed some vascular disease as well. The patient was admitted to the hospital and placed on intravenous antibiotics. Dr. Jeralene Huff was asked to see the patient and he subsequently asked me to discuss my findings with him. I have independently reviewed the patient's CT scans and chest x-rays. Of also discussed these with Dr. Clydie Braun and Dr. Bethann Humble. I believe that the most appropriate course of therapy would be to pursue a percutaneous pigtail catheter drainage for both sampling and for intrapleural thrombolytics. According to the nursing staff the patient's mother is in the hospital with similar findings. There was no family members available for my interview.   Past Medical History  Diagnosis Date  . Diabetes mellitus without complication (HCC)   . Hypertension   . Mentally disabled     History reviewed. No pertinent past surgical history.  Family History  Problem Relation Age of Onset  . Hypertension Mother   . Diabetes Mother   . Hypertension Father   . Diabetes Father     Social History Social History  Substance Use Topics  .  Smoking status: Never Smoker   . Smokeless tobacco: None  . Alcohol Use: No    Allergies  Allergen Reactions  . Penicillins Other (See Comments)    Reaction:  Unknown     Current Facility-Administered Medications  Medication Dose Route Frequency Provider Last Rate Last Dose  . 0.9 %  sodium chloride infusion   Intravenous Continuous Enedina Finner, MD 125 mL/hr at 09/22/15 0948    . acetaminophen (TYLENOL) tablet 1,000 mg  1,000 mg Oral Q6H PRN Enedina Finner, MD   1,000 mg at 09/22/15 0832  . atorvastatin (LIPITOR) tablet 20 mg  20 mg Oral QPM Shaune Pollack, MD      . enoxaparin (LOVENOX) injection 40 mg  40 mg Subcutaneous Q24H Enedina Finner, MD   40 mg at 09/22/15 1145  . insulin aspart (novoLOG) injection 0-15 Units  0-15 Units Subcutaneous TID WC Shaune Pollack, MD   8 Units at 09/22/15 1146  . insulin aspart (novoLOG) injection 0-5 Units  0-5 Units Subcutaneous QHS Shaune Pollack, MD      . insulin glargine (LANTUS) injection 36 Units  36 Units Subcutaneous QHS Shaune Pollack, MD      . levofloxacin (LEVAQUIN) IVPB 750 mg  750 mg Intravenous Q24H Shaune Pollack, MD      . lisinopril (PRINIVIL,ZESTRIL) tablet 10 mg  10 mg Oral Daily Shaune Pollack, MD   10 mg at 09/22/15 4696  . metroNIDAZOLE (FLAGYL) IVPB 500 mg  500 mg Intravenous Q8H Enedina Finner, MD   500 mg at 09/22/15 1143  .  oxyCODONE-acetaminophen (PERCOCET/ROXICET) 5-325 MG per tablet 1 tablet  1 tablet Oral Q6H PRN Enedina FinnerSona Patel, MD          Review of Systems A complete review of systems was asked and was negative except for the following positive findingsCould not be obtained because the patient did not speak English  Blood pressure 93/53, pulse 110, temperature 98.6 F (37 C), temperature source Oral, resp. rate 20, height 5\' 6"  (1.676 m), weight 125 lb 10.6 oz (57 kg), SpO2 95 %.  Physical Exam CONSTITUTIONAL:  Pleasant, well-developed, well-nourished, and in no acute distress.  Appeared to sleep throughout most of the exam. However he was easily arousable  and did appear to understand my questions although he could not communicate with me. EYES: Pupils equal and reactive to light, Sclera non-icteric EARS, NOSE, MOUTH AND THROAT:  The oropharynx was clear.  Dentition is poor repair.  Oral mucosa pink and moist. LYMPH NODES:  Lymph nodes in the neck and axillae were normal RESPIRATORY:  Lungs were decreased throughout with poor inspiratory effort.  Normal respiratory effort without pathologic use of accessory muscles of respiration CARDIOVASCULAR: Heart was regular without murmurs.  There were no carotid bruits. GI: The abdomen was soft, nontender, and nondistended. There were no palpable masses. There was no hepatosplenomegaly. There were normal bowel sounds in all quadrants. GU:  Rectal deferred.   MUSCULOSKELETAL:  Normal muscle strength and tone.  No clubbing or cyanosis.   SKIN:  There were no pathologic skin lesions.  There were no nodules on palpation.   Data Reviewed   I have personally reviewed the patient's imaging, laboratory findings and medical records.    Assessment    I believe this patient has a community-acquired pneumonia with a left pleural effusion.    Plan    I would like to have a percutaneous pigtail catheter placed in interventional radiology. The pleural effusion should be sampled and sent for appropriate analysis. If this returns an empyema or an exudative effusion we can use intrapleural thrombolytics. It would be helpful to have a family meeting with an interpreter so that all who are  involved in his care can speak to the family through an interpreter at the same time.      Hulda Marinimothy Kyria Bumgardner, MD 09/22/2015, 2:22 PM

## 2015-09-22 NOTE — Consult Note (Signed)
[facility] [place] Pulmonary Medicine Consultation      Date: 09/22/2015,   MRN# [id number] Adonys Bartelson [birth year] Code Status:  Code Status History    This patient does not have a recorded code status. Please follow your organizational policy for patients in this situation.     Hosp day:@LENGTHOFSTAYDAYS @ Referring MD: @     PCP:      AdmissionWeight: 125 lb 10.6 oz (57 kg)                 CurrentWeight: 125 lb 10.6 oz (57 kg) Venson Couzens is a 50 y.o. old male seen in consultation for left sided pneumonia at the request of Dr. Allena Katz     CHIEF COMPLAINT:   Acute pneumonia   HISTORY OF PRESENT ILLNESS  50 yo [place] male with mental disabilityThe patient does not speak English and was unable to provide any history. He did appear to understand my questions but could not answer and in a manner I could interpret. history is obtained from the chart.  -Apparently the patient was admitted to the emergency department yesterday after he presented with a history of chest pain fevers chills or shortness of breath. Fever was 102.8 this AM,  -CXR x-ray showed left lower lobe pneumonia with possible pleural effusion -CT scan confirmed cxr findings. The patient was admitted to the hospital and placed on intravenous antibiotics.  -I have discussed case with Dr. Thelma Barge and plans for  percutaneous pigtail catheter drainage for both sampling and for intrapleural thrombolytics.     PAST MEDICAL HISTORY   Past Medical History  Diagnosis Date  . Diabetes mellitus without complication (HCC)   . Hypertension   . Mentally disabled      SURGICAL HISTORY   History reviewed. No pertinent past surgical history.   FAMILY HISTORY   Family History  Problem Relation Age of Onset  . Hypertension Mother   . Diabetes Mother   . Hypertension Father   . Diabetes Father      SOCIAL HISTORY   Social History  Substance Use Topics  . Smoking status: Never Smoker   . Smokeless tobacco: None   . Alcohol Use: No  mental retardation   MEDICATIONS    Home Medication:  No current outpatient prescriptions on file.  Current Medication:  Current facility-administered medications:  .  0.9 %  sodium chloride infusion, , Intravenous, Continuous, Enedina Finner, MD, Last Rate: 125 mL/hr at 09/22/15 0948 .  acetaminophen (TYLENOL) tablet 1,000 mg, 1,000 mg, Oral, Q6H PRN, Enedina Finner, MD, 1,000 mg at 09/22/15 0832 .  atorvastatin (LIPITOR) tablet 20 mg, 20 mg, Oral, QPM, Shaune Pollack, MD .  enoxaparin (LOVENOX) injection 40 mg, 40 mg, Subcutaneous, Q24H, Enedina Finner, MD, 40 mg at 09/22/15 1145 .  insulin aspart (novoLOG) injection 0-15 Units, 0-15 Units, Subcutaneous, TID WC, Shaune Pollack, MD, 8 Units at 09/22/15 1146 .  insulin aspart (novoLOG) injection 0-5 Units, 0-5 Units, Subcutaneous, QHS, Shaune Pollack, MD .  insulin glargine (LANTUS) injection 36 Units, 36 Units, Subcutaneous, QHS, Shaune Pollack, MD .  levofloxacin (LEVAQUIN) IVPB 750 mg, 750 mg, Intravenous, Q24H, Shaune Pollack, MD .  metroNIDAZOLE (FLAGYL) IVPB 500 mg, 500 mg, Intravenous, Q8H, Enedina Finner, MD, 500 mg at 09/22/15 1143 .  oxyCODONE-acetaminophen (PERCOCET/ROXICET) 5-325 MG per tablet 1 tablet, 1 tablet, Oral, Q6H PRN, Enedina Finner, MD    ALLERGIES   Penicillins     REVIEW OF SYSTEMS   Review of Systems  Unable to perform ROS: medical  condition     VS: BP 93/53 mmHg  Pulse 110  Temp(Src) 98.6 F (37 C) (Oral)  Resp 20  Ht  (1.676 m)  Wt 125 lb 10.6 oz (57 kg)  BMI 20.29 kg/m2  SpO2 95%     PHYSICAL EXAM  Physical Exam  Constitutional: He appears well-developed and well-nourished. No distress.  HENT:  Head: Normocephalic and atraumatic.  Mouth/Throat: No oropharyngeal exudate.  Eyes: EOM are normal. Pupils are equal, round, and reactive to light. No scleral icterus.  Neck: Normal range of motion. Neck supple.  Cardiovascular: Normal rate, regular rhythm and normal heart sounds.   No murmur  heard. Pulmonary/Chest: No stridor. No respiratory distress. He has no wheezes. He has rales.  Abdominal: Soft. Bowel sounds are normal.  Musculoskeletal: Normal range of motion. He exhibits no edema.  Neurological: He is alert.  Skin: Skin is warm. He is diaphoretic.  Psychiatric: He has a normal mood and affect.        LABS    Recent Labs     09/21/15  1319  09/22/15  0400  HGB  13.3  12.0*  HCT  40.3  36.0*  MCV  88.8  87.8  WBC  14.9*  20.5*  BUN  29*  20  CREATININE  1.13  0.82  GLUCOSE  340*  264*  CALCIUM  9.1  7.6*  INR  1.18   --   ,       CULTURE RESULTS   Recent Results (from the past 240 hour(s))  Urine culture     Status: None   Collection Time: 09/21/15  1:19 PM  Result Value Ref Range Status   Specimen Description URINE, RANDOM  Final   Special Requests NONE  Final   Culture NO GROWTH 1 DAY  Final   Report Status 09/22/2015 FINAL  Final  Blood Culture (routine x 2)     Status: None (Preliminary result)   Collection Time: 09/21/15  3:34 PM  Result Value Ref Range Status   Specimen Description BLOOD LEFT ASSIST CONTROL  Final   Special Requests BOTTLES DRAWN AEROBIC AND ANAEROBIC  1CC  Final   Culture NO GROWTH < 12 HOURS  Final   Report Status PENDING  Incomplete  Rapid Influenza A&B Antigens ([facility] only)     Status: None   Collection Time: 09/21/15  3:34 PM  Result Value Ref Range Status   Influenza A ([facility]) NEGATIVE NEGATIVE Final   Influenza B ([facility]) NEGATIVE NEGATIVE Final  Blood Culture (routine x 2)     Status: None (Preliminary result)   Collection Time: 09/21/15  3:40 PM  Result Value Ref Range Status   Specimen Description BLOOD RIGHT ASSIST CONTROL  Final   Special Requests BOTTLES DRAWN AEROBIC AND ANAEROBIC  1CC  Final   Culture NO GROWTH < 12 HOURS  Final   Report Status PENDING  Incomplete          IMAGING    Dg Chest 2 View  09/21/2015  CLINICAL DATA:  Left rib pain without reported injury. EXAM: CHEST  2 VIEW  COMPARISON:  None. FINDINGS: The heart size and mediastinal contours are within normal limits. Right lung is clear. Mild to moderate left pleural effusion is noted with probable underlying atelectasis or infiltrate. The visualized skeletal structures are unremarkable. IMPRESSION: Mild to moderate left pleural effusion with probable underlying atelectasis or infiltrate. Electronically Signed   By: Lupita Raider, M.D.   On: 09/21/2015 15:03  Ct Angio Chest Pe W/cm &/or Wo Cm  09/21/2015  CLINICAL DATA:  Left-sided chest pain and shortness of breath. Left abdominal pain. EXAM: CT ANGIOGRAPHY CHEST CT ABDOMEN AND PELVIS WITH CONTRAST TECHNIQUE: Multidetector CT imaging of the chest was performed using the standard protocol during bolus administration of intravenous contrast. Multiplanar CT image reconstructions and MIPs were obtained to evaluate the vascular anatomy. Multidetector CT imaging of the abdomen and pelvis was performed using the standard protocol during bolus administration of intravenous contrast. CONTRAST:  100 cc Isovue 370 intravenous COMPARISON:  None. FINDINGS: CTA CHEST FINDINGS THORACIC INLET/BODY WALL: Subcutaneous reticulation in the left upper back is likely scarring. MEDIASTINUM: Normal heart size. No pericardial effusion. Atherosclerosis with notable proximal left subclavian artery stenosis due to low-density plaque with positive remodeling. Stenosis measures 75% when narrowing as compared to downstream vessel lumen. 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 When accounting for intermittent motion and streak artifact there is no convincing pulmonary embolism. LUNG WINDOWS: Small left pleural effusion with loculation causing scalloping and fissural extension. There is opacification of the lingula and basilar left lower lobe with heterogeneous enhancement and the anterior basilar segment where there is air-filled cavities. No pleural nodularity or enhancement. OSSEOUS: No acute  finding. CT ABDOMEN and PELVIS FINDINGS Abdominal wall:  No contributory findings. Hepatobiliary: No focal liver abnormality.No evidence of biliary obstruction or stone. Pancreas: Unremarkable. Spleen: Unremarkable. Adrenals/Urinary Tract: Prominent adrenal thickness enhancement without nodule. No hydronephrosis or stone. Distended bladder without wall thickening. Reproductive:Vas calcifications correlate with diabetes history. Stomach/Bowel:  No obstruction. No inflammation. Vascular/Lymphatic: Advanced for age aortic and iliac atherosclerosis. No acute vascular abnormality. No mass or adenopathy. Peritoneal: No ascites or pneumoperitoneum. Musculoskeletal: No acute abnormalities. Review of the MIP images confirms the above findings. IMPRESSION: 1. Cavitating left basilar pneumonia. Small to moderate parapneumonic effusion that is partially loculated. 2. Motion degraded CTA without evidence of pulmonary embolism. 3. Distended bladder, correlate for outlet obstruction. 4. Advanced for age atherosclerosis with high-grade proximal left subclavian artery stenosis. 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 Electronically Signed   By: Marnee SpringJonathon  Watts M.D.   On: 09/21/2015 16:58   Ct Abdomen Pelvis W Contrast  09/21/2015  CLINICAL DATA:  Left-sided chest pain and shortness of breath. Left abdominal pain. EXAM: CT ANGIOGRAPHY CHEST CT ABDOMEN AND PELVIS WITH CONTRAST TECHNIQUE: Multidetector CT imaging of the chest was performed using the standard protocol during bolus administration of intravenous contrast. Multiplanar CT image reconstructions and MIPs were obtained to evaluate the vascular anatomy. Multidetector CT imaging of the abdomen and pelvis was performed using the standard protocol during bolus administration of intravenous contrast. CONTRAST:  100 cc Isovue 370 intravenous COMPARISON:  None. FINDINGS: CTA CHEST FINDINGS THORACIC INLET/BODY WALL: Subcutaneous reticulation in the left upper back is likely  scarring. MEDIASTINUM: Normal heart size. No pericardial effusion. Atherosclerosis with notable proximal left subclavian artery stenosis due to low-density plaque with positive remodeling. Stenosis measures 75% when narrowing as compared to downstream vessel lumen. 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 When accounting for intermittent motion and streak artifact there is no convincing pulmonary embolism. LUNG WINDOWS: Small left pleural effusion with loculation causing scalloping and fissural extension. There is opacification of the lingula and basilar left lower lobe with heterogeneous enhancement and the anterior basilar segment where there is air-filled cavities. No pleural nodularity or enhancement. OSSEOUS: No acute finding. CT ABDOMEN and PELVIS FINDINGS Abdominal wall:  No contributory findings. Hepatobiliary: No focal liver abnormality.No evidence of biliary obstruction  or stone. Pancreas: Unremarkable. Spleen: Unremarkable. Adrenals/Urinary Tract: Prominent adrenal thickness enhancement without nodule. No hydronephrosis or stone. Distended bladder without wall thickening. Reproductive:Vas calcifications correlate with diabetes history. Stomach/Bowel:  No obstruction. No inflammation. Vascular/Lymphatic: Advanced for age aortic and iliac atherosclerosis. No acute vascular abnormality. No mass or adenopathy. Peritoneal: No ascites or pneumoperitoneum. Musculoskeletal: No acute abnormalities. Review of the MIP images confirms the above findings. IMPRESSION: 1. Cavitating left basilar pneumonia. Small to moderate parapneumonic effusion that is partially loculated. 2. Motion degraded CTA without evidence of pulmonary embolism. 3. Distended bladder, correlate for outlet obstruction. 4. Advanced for age atherosclerosis with high-grade proximal left subclavian artery stenosis. 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 Electronically Signed   By: Marnee Spring M.D.   On: 09/21/2015 16:58   Dg  Chest Port 1 View  09/22/2015  CLINICAL DATA:  Sepsis. EXAM: PORTABLE CHEST 1 VIEW COMPARISON:  Radiographs and chest CT yesterday. FINDINGS: Increasing left pleural effusion with associated basilar consolidation. Effusion appears partially loculated laterally. Question of of pan subpulmonic right pleural effusion. Cardiomediastinal contours are unchanged. No pulmonary edema. IMPRESSION: Increasing partially loculated left pleural effusion with associated left basilar consolidation. Question developing small right pleural effusion. Electronically Signed   By: Rubye Oaks M.D.   On: 09/22/2015 02:49     cxr adn CT Images reveiwed 09/22/2015    ASSESSMENT/PLAN   50 yo [place] male with acute fevers and chills sepsis with acute left sided pneumonia with effusion, this may progress to necrotizing pneumonia  1.continue oxygen as needed 2.continue abx as prescribed  3.Dr. Virgel Paling recs     I have personally obtained a history, examined the patient, evaluated laboratory and independently reviewed imaging results, formulated the assessment and plan and placed orders.  The Patient requires high complexity decision making for assessment and support, frequent evaluation and titration of therapies, application of advanced monitoring technologies and extensive interpretation of multiple databases.    Lucie Leather, M.D.  [facility]  Medical Director [facility] Medical Director [facility] Cardio-Pulmonary Department

## 2015-09-22 NOTE — Progress Notes (Signed)
  September 22, 2015  Patient: Sean [name]  Date of Birth: [birth year]  Date of Visit: 09/21/2015    To Whom It May Concern:  Sean [name] was seen and treated in our emergency department on 09/21/2015 until present. Please excuse Jabier Muttonqbal Rullo from work for 09/21/2015 and 09/22/2015.   Sincerely,  Audria NineAmanda Deliah Strehlow RN

## 2015-09-22 NOTE — Progress Notes (Addendum)
Patient ID: Sean [name], male   DOB: [birth year], 50 y.o.   MRN: [id number] [facility] [place] [facility]   PATIENT NAME: Sean Pilotsif Mika    MR#:  [id number]  DATE OF BIRTH:  [birth year]  SUBJECTIVE:  Came in with fever, chills and found to have pneumonia. C/o left sided pleuritic pain  REVIEW OF SYSTEMS:   Review of Systems  Constitutional: Positive for fever. Negative for chills and weight loss.  HENT: Negative for ear discharge, ear pain and nosebleeds.   Eyes: Negative for blurred vision, pain and discharge.  Respiratory: Positive for cough and shortness of breath. Negative for sputum production, wheezing and stridor.   Cardiovascular: Negative for chest pain, palpitations, orthopnea and PND.  Gastrointestinal: Negative for nausea, vomiting, abdominal pain and diarrhea.  Genitourinary: Negative for urgency and frequency.  Musculoskeletal: Negative for back pain and joint pain.  Neurological: Positive for weakness. Negative for sensory change, speech change and focal weakness.  Psychiatric/Behavioral: Negative for depression and hallucinations. The patient is not nervous/anxious.   All other systems reviewed and are negative.  Tolerating Diet:yes Tolerating PT: not needed  DRUG ALLERGIES:   Allergies  Allergen Reactions  . Penicillins Other (See Comments)    Reaction:  Unknown     VITALS:  Blood pressure 155/73, pulse 125, temperature 98.7 F (37.1 C), temperature source Oral, resp. rate 18, height 5\' 6"  (1.676 m), weight 57 kg (125 lb 10.6 oz), SpO2 95 %.  PHYSICAL EXAMINATION:   Physical Exam  GENERAL:  50 y.o.-year-old patient lying in the bed with no acute distress.  EYES: Pupils equal, round, reactive to light and accommodation. No scleral icterus. Extraocular muscles intact.  HEENT: Head atraumatic, normocephalic. Oropharynx and nasopharynx clear.  NECK:  Supple, no jugular venous distention. No thyroid enlargement, no tenderness.   LUNGS: decreased breath sounds left LL, no wheezing, rales, rhonchi. No use of accessory muscles of respiration.  CARDIOVASCULAR: S1, S2 normal. No murmurs, rubs, or gallops.  ABDOMEN: Soft, nontender, nondistended. Bowel sounds present. No organomegaly or mass.  EXTREMITIES: No cyanosis, clubbing or edema b/l.    NEUROLOGIC: Cranial nerves II through XII are intact. No focal Motor or sensory deficits b/l.   PSYCHIATRIC:  patient is alert and oriented x 3.  SKIN: No obvious rash, lesion, or ulcer.   LABORATORY PANEL:  CBC  Recent Labs Lab 09/22/15 0400  WBC 20.5*  HGB 12.0*  HCT 36.0*  PLT 327    Chemistries   Recent Labs Lab 09/21/15 1319 09/22/15 0400  NA 127* 127*  K 4.9 4.5  CL 90* 99*  CO2 18* 19*  GLUCOSE 340* 264*  BUN 29* 20  CREATININE 1.13 0.82  CALCIUM 9.1 7.6*  AST 41  --   ALT 34  --   ALKPHOS 69  --   BILITOT 1.1  --    Cardiac Enzymes  Recent Labs Lab 09/21/15 2346  TROPONINI 0.08*   RADIOLOGY:  Dg Chest 2 View  09/21/2015  CLINICAL DATA:  Left rib pain without reported injury. EXAM: CHEST  2 VIEW COMPARISON:  None. FINDINGS: The heart size and mediastinal contours are within normal limits. Right lung is clear. Mild to moderate left pleural effusion is noted with probable underlying atelectasis or infiltrate. The visualized skeletal structures are unremarkable. IMPRESSION: Mild to moderate left pleural effusion with probable underlying atelectasis or infiltrate. Electronically Signed   By: Lupita RaiderJames  Green Jr, M.D.   On: 09/21/2015 15:03   Ct Angio Chest  Pe W/cm &/or Wo Cm  09/21/2015  CLINICAL DATA:  Left-sided chest pain and shortness of breath. Left abdominal pain. EXAM: CT ANGIOGRAPHY CHEST CT ABDOMEN AND PELVIS WITH CONTRAST TECHNIQUE: Multidetector CT imaging of the chest was performed using the standard protocol during bolus administration of intravenous contrast. Multiplanar CT image reconstructions and MIPs were obtained to evaluate the vascular  anatomy. Multidetector CT imaging of the abdomen and pelvis was performed using the standard protocol during bolus administration of intravenous contrast. CONTRAST:  100 cc Isovue 370 intravenous COMPARISON:  None. FINDINGS: CTA CHEST FINDINGS THORACIC INLET/BODY WALL: Subcutaneous reticulation in the left upper back is likely scarring. MEDIASTINUM: Normal heart size. No pericardial effusion. Atherosclerosis with notable proximal left subclavian artery stenosis due to low-density plaque with positive remodeling. Stenosis measures 75% when narrowing as compared to downstream vessel lumen. 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 When accounting for intermittent motion and streak artifact there is no convincing pulmonary embolism. LUNG WINDOWS: Small left pleural effusion with loculation causing scalloping and fissural extension. There is opacification of the lingula and basilar left lower lobe with heterogeneous enhancement and the anterior basilar segment where there is air-filled cavities. No pleural nodularity or enhancement. OSSEOUS: No acute finding. CT ABDOMEN and PELVIS FINDINGS Abdominal wall:  No contributory findings. Hepatobiliary: No focal liver abnormality.No evidence of biliary obstruction or stone. Pancreas: Unremarkable. Spleen: Unremarkable. Adrenals/Urinary Tract: Prominent adrenal thickness enhancement without nodule. No hydronephrosis or stone. Distended bladder without wall thickening. Reproductive:Vas calcifications correlate with diabetes history. Stomach/Bowel:  No obstruction. No inflammation. Vascular/Lymphatic: Advanced for age aortic and iliac atherosclerosis. No acute vascular abnormality. No mass or adenopathy. Peritoneal: No ascites or pneumoperitoneum. Musculoskeletal: No acute abnormalities. Review of the MIP images confirms the above findings. IMPRESSION: 1. Cavitating left basilar pneumonia. Small to moderate parapneumonic effusion that is partially loculated. 2. Motion  degraded CTA without evidence of pulmonary embolism. 3. Distended bladder, correlate for outlet obstruction. 4. Advanced for age atherosclerosis with high-grade proximal left subclavian artery stenosis. 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 Electronically Signed   By: Marnee Spring M.D.   On: 09/21/2015 16:58   Ct Abdomen Pelvis W Contrast  09/21/2015  CLINICAL DATA:  Left-sided chest pain and shortness of breath. Left abdominal pain. EXAM: CT ANGIOGRAPHY CHEST CT ABDOMEN AND PELVIS WITH CONTRAST TECHNIQUE: Multidetector CT imaging of the chest was performed using the standard protocol during bolus administration of intravenous contrast. Multiplanar CT image reconstructions and MIPs were obtained to evaluate the vascular anatomy. Multidetector CT imaging of the abdomen and pelvis was performed using the standard protocol during bolus administration of intravenous contrast. CONTRAST:  100 cc Isovue 370 intravenous COMPARISON:  None. FINDINGS: CTA CHEST FINDINGS THORACIC INLET/BODY WALL: Subcutaneous reticulation in the left upper back is likely scarring. MEDIASTINUM: Normal heart size. No pericardial effusion. Atherosclerosis with notable proximal left subclavian artery stenosis due to low-density plaque with positive remodeling. Stenosis measures 75% when narrowing as compared to downstream vessel lumen. 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 When accounting for intermittent motion and streak artifact there is no convincing pulmonary embolism. LUNG WINDOWS: Small left pleural effusion with loculation causing scalloping and fissural extension. There is opacification of the lingula and basilar left lower lobe with heterogeneous enhancement and the anterior basilar segment where there is air-filled cavities. No pleural nodularity or enhancement. OSSEOUS: No acute finding. CT ABDOMEN and PELVIS FINDINGS Abdominal wall:  No contributory findings. Hepatobiliary: No focal liver abnormality.No  evidence of biliary obstruction or stone.  Pancreas: Unremarkable. Spleen: Unremarkable. Adrenals/Urinary Tract: Prominent adrenal thickness enhancement without nodule. No hydronephrosis or stone. Distended bladder without wall thickening. Reproductive:Vas calcifications correlate with diabetes history. Stomach/Bowel:  No obstruction. No inflammation. Vascular/Lymphatic: Advanced for age aortic and iliac atherosclerosis. No acute vascular abnormality. No mass or adenopathy. Peritoneal: No ascites or pneumoperitoneum. Musculoskeletal: No acute abnormalities. Review of the MIP images confirms the above findings. IMPRESSION: 1. Cavitating left basilar pneumonia. Small to moderate parapneumonic effusion that is partially loculated. 2. Motion degraded CTA without evidence of pulmonary embolism. 3. Distended bladder, correlate for outlet obstruction. 4. Advanced for age atherosclerosis with high-grade proximal left subclavian artery stenosis. 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 Electronically Signed   By: Marnee Spring M.D.   On: 09/21/2015 16:58   Dg Chest Port 1 View  09/22/2015  CLINICAL DATA:  Sepsis. EXAM: PORTABLE CHEST 1 VIEW COMPARISON:  Radiographs and chest CT yesterday. FINDINGS: Increasing left pleural effusion with associated basilar consolidation. Effusion appears partially loculated laterally. Question of of pan subpulmonic right pleural effusion. Cardiomediastinal contours are unchanged. No pulmonary edema. IMPRESSION: Increasing partially loculated left pleural effusion with associated left basilar consolidation. Question developing small right pleural effusion. Electronically Signed   By: Rubye Oaks M.D.   On: 09/22/2015 02:49   ASSESSMENT AND PLAN:  Abdulai Stanco is a 50 y.o. male with a known history of Hypertension and diabetes and mental retardation. The patient cannot provide any information. According to his brother, who was living with him, the patient got a flu from her  sister 2 days ago. He started to have a fever, chills, cough with sputum, shortness of breath and chest pain  1.Sepsis with  left LL pneumonia and pleural effusion (leukocytosis, tachycardia and fever) -cont levaquin and flagyl -d/ced vanc -MRSA PCR pending -BC negative so far -CT chest shows loculated fluid collection -appreciated dr Thelma Barge input. Will place orders for IR to place pigtail catheter tomorrow for possible empyema and sent fluid for C/s  2.Lactic acidosis. Follow-up lactic acid level and supportive care. -trending down -supportive care -IVF  3.Elevated troponin. Possible due to demanding ischemia, follow-up troponin level, start aspirin and continue statin.  4.Hyponatremia. Continue normal saline IV and follow-up BMP.  5.Diabetes. Start sliding scale and hold metformin.continue LAntus  6. Relative hypotension in pt with H/o Hypertension. Hold lisinopril.     Case discussed with Care Management/Social Worker. Management plans discussed with the patient, family and they are in agreement.  CODE STATUS: full  DVT Prophylaxis: lovenox  TOTAL TIME TAKING CARE OF THIS PATIENT:35 minutes.  >50% time spent on counselling and coordination of care  POSSIBLE D/C IN 1-2 DAYS, DEPENDING ON CLINICAL CONDITION.  Note: This dictation was prepared with Dragon dictation along with smaller phrase technology. Any transcriptional errors that result from this process are unintentional.  Ethridge Sollenberger M.D on 09/22/2015 at 5:07 PM  Between 7am to 6pm - Pager - [phone]  After 6pm go to [email or web address] - password [email or web address] [facility]  [place] [facility]  Office  [phone]  CC: Primary care physician; Emogene Morgan, MD

## 2015-09-22 NOTE — Consult Note (Addendum)
[facility] Infectious Disease     Reason for Consult: Cavitary PNA    Referring Physician: Nicholes Mango Date of Admission:  09/21/2015   Active Problems:   Sepsis ([place])   Pneumonia   HPI: Sean [name] is a 50 y.o. male With a history of mental retardation admitted with a week of cough and fevers.  He is seen today with his brother.  His brother reports his sister was ill with flulike illness and so were most of the family members.  However Sean [name] does continue to be sick and developed high fevers.  Is also been having pleuritic chest pain.  Prior to this weeklong episode he was doing fine.  He emigrated here from [place] several years ago.  He has no history of TB or known TB contacts.  His brother is unsure if he has ever been worked up for TB before.  He was not having weight loss night sweats fevers or chills prior to this recent episode.  Since admission he has been found to have cavitary lower lobe pneumonia with pleural effusion.  Is been seen by pulmonary and by cardiothoracic surgeon.  Past Medical History  Diagnosis Date  . Diabetes mellitus without complication ([place])   . Hypertension   . Mentally disabled    History reviewed. No pertinent past surgical history. Social History  Substance Use Topics  . Smoking status: Never Smoker   . Smokeless tobacco: None  . Alcohol Use: No   Family History  Problem Relation Age of Onset  . Hypertension Mother   . Diabetes Mother   . Hypertension Father   . Diabetes Father     Allergies:  Allergies  Allergen Reactions  . Penicillins Other (See Comments)    Reaction:  Unknown     Current antibiotics: Antibiotics Given (last 72 hours)    Date/Time Action Medication Dose Rate   09/22/15 1143 Given   metroNIDAZOLE (FLAGYL) IVPB 500 mg 500 mg 100 mL/hr      MEDICATIONS: . atorvastatin  20 mg Oral QPM  . enoxaparin (LOVENOX) injection  40 mg Subcutaneous Q24H  . insulin aspart  0-15 Units Subcutaneous TID WC  . insulin  aspart  0-5 Units Subcutaneous QHS  . insulin glargine  36 Units Subcutaneous QHS  . levofloxacin (LEVAQUIN) IV  750 mg Intravenous Q24H  . lisinopril  10 mg Oral Daily  . metronidazole  500 mg Intravenous Q8H    Review of Systems -  Unable to obtain   OBJECTIVE: Temp:  [98.6 F (37 C)-102.8 F (39.3 C)] 98.6 F (37 C) (04/05 1104) Pulse Rate:  [107-131] 110 (04/05 1104) Resp:  [16-27] 20 (04/05 1104) BP: (93-180)/(53-139) 93/53 mmHg (04/05 1104) SpO2:  [77 %-99 %] 95 % (04/05 1104) Physical Exam  Constitutional: thin, nad HENT: perrla Mouth/Throat: Oropharynx is clear and moist. No oropharyngeal exudate.  Cardiovascular: Normal rate, regular rhythm and normal heart sounds. Exam reveals no gallop and no friction rub.  No murmur heard.  Pulmonary/Chest: bibasilar crackles and rhonchi Abdominal: Soft. Bowel sounds are normal. He exhibits no distension. There is no tenderness.  Lymphadenopathy: He has no cervical adenopathy.  Neurological: He is alert and interactive Skin: Skin is warm and dry. No rash noted. No erythema.  Psychiatric: He has a normal mood and affect. His behavior is normal.     LABS: Results for orders placed or performed during the hospital encounter of 09/21/15 (from the past 48 hour(s))  Glucose, capillary     Status:  Abnormal   Collection Time: 09/21/15  1:17 PM  Result Value Ref Range   Glucose-Capillary 321 (H) 65 - 99 mg/dL   Comment 1 Notify RN   Basic metabolic panel     Status: Abnormal   Collection Time: 09/21/15  1:19 PM  Result Value Ref Range   Sodium 127 (L) 135 - 145 mmol/L   Potassium 4.9 3.5 - 5.1 mmol/L   Chloride 90 (L) 101 - 111 mmol/L   CO2 18 (L) 22 - 32 mmol/L   Glucose, Bld 340 (H) 65 - 99 mg/dL   BUN 29 (H) 6 - 20 mg/dL   Creatinine, Ser 1.13 0.61 - 1.24 mg/dL   Calcium 9.1 8.9 - 10.3 mg/dL   GFR calc non Af Amer >60 >60 mL/min   GFR calc Af Amer >60 >60 mL/min    Comment: (NOTE) The eGFR has been calculated using the CKD  EPI equation. This calculation has not been validated in all clinical situations. eGFR's persistently <60 mL/min signify possible Chronic Kidney Disease.    Anion gap 19 (H) 5 - 15  CBC     Status: Abnormal   Collection Time: 09/21/15  1:19 PM  Result Value Ref Range   WBC 14.9 (H) 3.8 - 10.6 K/uL   RBC 4.54 4.40 - 5.90 MIL/uL   Hemoglobin 13.3 13.0 - 18.0 g/dL   HCT 40.3 40.0 - 52.0 %   MCV 88.8 80.0 - 100.0 fL   MCH 29.2 26.0 - 34.0 pg   MCHC 32.9 32.0 - 36.0 g/dL   RDW 12.9 11.5 - 14.5 %   Platelets 367 150 - 440 K/uL  Troponin I     Status: Abnormal   Collection Time: 09/21/15  1:19 PM  Result Value Ref Range   Troponin I 0.26 (H) <0.031 ng/mL    Comment: READ BACK AND VERIFIED WITH TINA  CARR AT 9450 09/21/15 SDR        PERSISTENTLY INCREASED TROPONIN VALUES IN THE RANGE OF 0.04-0.49 ng/mL CAN BE SEEN IN:       -UNSTABLE ANGINA       -CONGESTIVE HEART FAILURE       -MYOCARDITIS       -CHEST TRAUMA       -ARRYHTHMIAS       -LATE PRESENTING MYOCARDIAL INFARCTION       -COPD   CLINICAL FOLLOW-UP RECOMMENDED.   Urine culture     Status: None   Collection Time: 09/21/15  1:19 PM  Result Value Ref Range   Specimen Description URINE, RANDOM    Special Requests NONE    Culture NO GROWTH 1 DAY    Report Status 09/22/2015 FINAL   Urinalysis complete, with microscopic     Status: Abnormal   Collection Time: 09/21/15  1:19 PM  Result Value Ref Range   Color, Urine YELLOW (A) YELLOW   APPearance CLEAR (A) CLEAR   Glucose, UA >500 (A) NEGATIVE mg/dL   Bilirubin Urine NEGATIVE NEGATIVE   Ketones, ur 2+ (A) NEGATIVE mg/dL   Specific Gravity, Urine 1.027 1.005 - 1.030   Hgb urine dipstick 2+ (A) NEGATIVE   pH 6.0 5.0 - 8.0   Protein, ur 100 (A) NEGATIVE mg/dL   Nitrite NEGATIVE NEGATIVE   Leukocytes, UA NEGATIVE NEGATIVE   RBC / HPF 0-5 0 - 5 RBC/hpf   WBC, UA 0-5 0 - 5 WBC/hpf   Bacteria, UA RARE (A) NONE SEEN   Squamous Epithelial / LPF NONE SEEN NONE SEEN  Mucous  PRESENT    Amorphous Crystal PRESENT   Lipase, blood     Status: None   Collection Time: 09/21/15  1:19 PM  Result Value Ref Range   Lipase 12 11 - 51 U/L  Protime-INR     Status: Abnormal   Collection Time: 09/21/15  1:19 PM  Result Value Ref Range   Prothrombin Time 15.2 (H) 11.4 - 15.0 seconds   INR 1.18   Hepatic function panel     Status: Abnormal   Collection Time: 09/21/15  1:19 PM  Result Value Ref Range   Total Protein 8.5 (H) 6.5 - 8.1 g/dL   Albumin 3.6 3.5 - 5.0 g/dL   AST 41 15 - 41 U/L   ALT 34 17 - 63 U/L   Alkaline Phosphatase 69 38 - 126 U/L   Total Bilirubin 1.1 0.3 - 1.2 mg/dL   Bilirubin, Direct 0.3 0.1 - 0.5 mg/dL   Indirect Bilirubin 0.8 0.3 - 0.9 mg/dL  Brain natriuretic peptide     Status: None   Collection Time: 09/21/15  1:19 PM  Result Value Ref Range   B Natriuretic Peptide 81.0 0.0 - 100.0 pg/mL  Blood Culture (routine x 2)     Status: None (Preliminary result)   Collection Time: 09/21/15  3:34 PM  Result Value Ref Range   Specimen Description BLOOD LEFT ASSIST CONTROL    Special Requests BOTTLES DRAWN AEROBIC AND ANAEROBIC  1CC    Culture NO GROWTH < 12 HOURS    Report Status PENDING   Lactic acid, plasma     Status: Abnormal   Collection Time: 09/21/15  3:34 PM  Result Value Ref Range   Lactic Acid, Venous 3.3 (HH) 0.5 - 2.0 mmol/L    Comment: CRITICAL RESULT CALLED TO, READ BACK BY AND VERIFIED WITH  GRACE CINDRIC AT 1646 09/21/15 SDR   Blood gas, venous     Status: Abnormal   Collection Time: 09/21/15  3:34 PM  Result Value Ref Range   FIO2 0.21    pH, Ven 7.29 (L) 7.320 - 7.430   pCO2, Ven 35 (L) 44.0 - 60.0 mmHg   pO2, Ven <31.0 (L) 31.0 - 45.0 mmHg   Bicarbonate 16.8 (L) 21.0 - 28.0 mEq/L   Acid-base deficit 8.9 (H) 0.0 - 2.0 mmol/L   Patient temperature 37.0    Collection site VEIN    Sample type VENOUS   Rapid Influenza A&B Antigens ([facility] only)     Status: None   Collection Time: 09/21/15  3:34 PM  Result Value Ref Range    Influenza A ([facility]) NEGATIVE NEGATIVE   Influenza B ([facility]) NEGATIVE NEGATIVE  Blood Culture (routine x 2)     Status: None (Preliminary result)   Collection Time: 09/21/15  3:40 PM  Result Value Ref Range   Specimen Description BLOOD RIGHT ASSIST CONTROL    Special Requests BOTTLES DRAWN AEROBIC AND ANAEROBIC  1CC    Culture NO GROWTH < 12 HOURS    Report Status PENDING   Lactic acid, plasma     Status: Abnormal   Collection Time: 09/21/15  6:13 PM  Result Value Ref Range   Lactic Acid, Venous 2.6 (HH) 0.5 - 2.0 mmol/L    Comment: CRITICAL RESULT CALLED TO, READ BACK BY AND VERIFIED WITH  GRACE CINDRIC AT 1902 09/21/15 SDR   Troponin I     Status: Abnormal   Collection Time: 09/21/15 11:46 PM  Result Value Ref Range   Troponin I  0.08 (H) <0.031 ng/mL    Comment: PREVIOUS RESULT CALLED TO TINA CARR AT 5670 09/21/15 BY SDR. KLK        PERSISTENTLY INCREASED TROPONIN VALUES IN THE RANGE OF 0.04-0.49 ng/mL CAN BE SEEN IN:       -UNSTABLE ANGINA       -CONGESTIVE HEART FAILURE       -MYOCARDITIS       -CHEST TRAUMA       -ARRYHTHMIAS       -LATE PRESENTING MYOCARDIAL INFARCTION       -COPD   CLINICAL FOLLOW-UP RECOMMENDED.   Lactic acid, plasma     Status: None   Collection Time: 09/21/15 11:46 PM  Result Value Ref Range   Lactic Acid, Venous 1.6 0.5 - 2.0 mmol/L  Glucose, capillary     Status: Abnormal   Collection Time: 09/22/15  2:19 AM  Result Value Ref Range   Glucose-Capillary 312 (H) 65 - 99 mg/dL  Glucose, capillary     Status: Abnormal   Collection Time: 09/22/15  2:21 AM  Result Value Ref Range   Glucose-Capillary 270 (H) 65 - 99 mg/dL  Influenza panel by PCR (type A & B, H1N1)     Status: None   Collection Time: 09/22/15  3:16 AM  Result Value Ref Range   Influenza A By PCR NEGATIVE NEGATIVE   Influenza B By PCR NEGATIVE NEGATIVE   H1N1 flu by pcr NOT DETECTED NOT DETECTED    Comment:        The Xpert Flu assay (FDA approved for nasal aspirates or washes  and nasopharyngeal swab specimens), is intended as an aid in the diagnosis of influenza and should not be used as a sole basis for treatment.   Procalcitonin     Status: None   Collection Time: 09/22/15  4:00 AM  Result Value Ref Range   Procalcitonin >154.00 ng/mL    Comment:        Interpretation: PCT >= 10 ng/mL: Important systemic inflammatory response, almost exclusively due to severe bacterial sepsis or septic shock. (NOTE)         ICU PCT Algorithm               Non ICU PCT Algorithm    ----------------------------     ------------------------------         PCT < 0.25 ng/mL                 PCT < 0.1 ng/mL     Stopping of antibiotics            Stopping of antibiotics       strongly encouraged.               strongly encouraged.    ----------------------------     ------------------------------       PCT level decrease by               PCT < 0.25 ng/mL       >= 80% from peak PCT       OR PCT 0.25 - 0.5 ng/mL          Stopping of antibiotics                                             encouraged.     Stopping of antibiotics  encouraged.    ----------------------------     ------------------------------       PCT level decrease by              PCT >= 0.25 ng/mL       < 80% from peak PCT        AND PCT >= 0.5 ng/mL             Continuing antibiotics                                              encouraged.       Continuing antibiotics            encouraged.    ----------------------------     ------------------------------     PCT level increase compared          PCT > 0.5 ng/mL         with peak PCT AND          PCT >= 0.5 ng/mL             Escalation of antibiotics                                          strongly encouraged.      Escalation of antibiotics        strongly encouraged.   CBC     Status: Abnormal   Collection Time: 09/22/15  4:00 AM  Result Value Ref Range   WBC 20.5 (H) 3.8 - 10.6 K/uL   RBC 4.10 (L) 4.40 - 5.90 MIL/uL   Hemoglobin 12.0  (L) 13.0 - 18.0 g/dL   HCT 36.0 (L) 40.0 - 52.0 %   MCV 87.8 80.0 - 100.0 fL   MCH 29.3 26.0 - 34.0 pg   MCHC 33.3 32.0 - 36.0 g/dL   RDW 12.9 11.5 - 14.5 %   Platelets 327 150 - 440 K/uL  Basic metabolic panel     Status: Abnormal   Collection Time: 09/22/15  4:00 AM  Result Value Ref Range   Sodium 127 (L) 135 - 145 mmol/L   Potassium 4.5 3.5 - 5.1 mmol/L   Chloride 99 (L) 101 - 111 mmol/L   CO2 19 (L) 22 - 32 mmol/L   Glucose, Bld 264 (H) 65 - 99 mg/dL   BUN 20 6 - 20 mg/dL   Creatinine, Ser 0.82 0.61 - 1.24 mg/dL   Calcium 7.6 (L) 8.9 - 10.3 mg/dL   GFR calc non Af Amer >60 >60 mL/min   GFR calc Af Amer >60 >60 mL/min    Comment: (NOTE) The eGFR has been calculated using the CKD EPI equation. This calculation has not been validated in all clinical situations. eGFR's persistently <60 mL/min signify possible Chronic Kidney Disease.    Anion gap 9 5 - 15  Glucose, capillary     Status: Abnormal   Collection Time: 09/22/15  7:52 AM  Result Value Ref Range   Glucose-Capillary 220 (H) 65 - 99 mg/dL  Glucose, capillary     Status: Abnormal   Collection Time: 09/22/15 11:02 AM  Result Value Ref Range   Glucose-Capillary 297 (H) 65 - 99 mg/dL   No components found for: ESR, C REACTIVE PROTEIN MICRO: Recent Results (from  the past 720 hour(s))  Urine culture     Status: None   Collection Time: 09/21/15  1:19 PM  Result Value Ref Range Status   Specimen Description URINE, RANDOM  Final   Special Requests NONE  Final   Culture NO GROWTH 1 DAY  Final   Report Status 09/22/2015 FINAL  Final  Blood Culture (routine x 2)     Status: None (Preliminary result)   Collection Time: 09/21/15  3:34 PM  Result Value Ref Range Status   Specimen Description BLOOD LEFT ASSIST CONTROL  Final   Special Requests BOTTLES DRAWN AEROBIC AND ANAEROBIC  1CC  Final   Culture NO GROWTH < 12 HOURS  Final   Report Status PENDING  Incomplete  Rapid Influenza A&B Antigens ([place] only)     Status: None    Collection Time: 09/21/15  3:34 PM  Result Value Ref Range Status   Influenza A ([place]) NEGATIVE NEGATIVE Final   Influenza B ([facility]) NEGATIVE NEGATIVE Final  Blood Culture (routine x 2)     Status: None (Preliminary result)   Collection Time: 09/21/15  3:40 PM  Result Value Ref Range Status   Specimen Description BLOOD RIGHT ASSIST CONTROL  Final   Special Requests BOTTLES DRAWN AEROBIC AND ANAEROBIC  1CC  Final   Culture NO GROWTH < 12 HOURS  Final   Report Status PENDING  Incomplete    IMAGING: Dg Chest 2 View  09/21/2015  CLINICAL DATA:  Left rib pain without reported injury. EXAM: CHEST  2 VIEW COMPARISON:  None. FINDINGS: The heart size and mediastinal contours are within normal limits. Right lung is clear. Mild to moderate left pleural effusion is noted with probable underlying atelectasis or infiltrate. The visualized skeletal structures are unremarkable. IMPRESSION: Mild to moderate left pleural effusion with probable underlying atelectasis or infiltrate. Electronically Signed   By: Marijo Conception, M.D.   On: 09/21/2015 15:03   Ct Angio Chest Pe W/cm &/or Wo Cm  09/21/2015  CLINICAL DATA:  Left-sided chest pain and shortness of breath. Left abdominal pain. EXAM: CT ANGIOGRAPHY CHEST CT ABDOMEN AND PELVIS WITH CONTRAST TECHNIQUE: Multidetector CT imaging of the chest was performed using the standard protocol during bolus administration of intravenous contrast. Multiplanar CT image reconstructions and MIPs were obtained to evaluate the vascular anatomy. Multidetector CT imaging of the abdomen and pelvis was performed using the standard protocol during bolus administration of intravenous contrast. CONTRAST:  100 cc Isovue 370 intravenous COMPARISON:  None. FINDINGS: CTA CHEST FINDINGS THORACIC INLET/BODY WALL: Subcutaneous reticulation in the left upper back is likely scarring. MEDIASTINUM: Normal heart size. No pericardial effusion. Atherosclerosis with notable proximal left subclavian  artery stenosis due to low-density plaque with positive remodeling. Stenosis measures 75% when narrowing as compared to downstream vessel lumen. 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 When accounting for intermittent motion and streak artifact there is no convincing pulmonary embolism. LUNG WINDOWS: Small left pleural effusion with loculation causing scalloping and fissural extension. There is opacification of the lingula and basilar left lower lobe with heterogeneous enhancement and the anterior basilar segment where there is air-filled cavities. No pleural nodularity or enhancement. OSSEOUS: No acute finding. CT ABDOMEN and PELVIS FINDINGS Abdominal wall:  No contributory findings. Hepatobiliary: No focal liver abnormality.No evidence of biliary obstruction or stone. Pancreas: Unremarkable. Spleen: Unremarkable. Adrenals/Urinary Tract: Prominent adrenal thickness enhancement without nodule. No hydronephrosis or stone. Distended bladder without wall thickening. Reproductive:Vas calcifications correlate with diabetes history. Stomach/Bowel:  No obstruction. No  inflammation. Vascular/Lymphatic: Advanced for age aortic and iliac atherosclerosis. No acute vascular abnormality. No mass or adenopathy. Peritoneal: No ascites or pneumoperitoneum. Musculoskeletal: No acute abnormalities. Review of the MIP images confirms the above findings. IMPRESSION: 1. Cavitating left basilar pneumonia. Small to moderate parapneumonic effusion that is partially loculated. 2. Motion degraded CTA without evidence of pulmonary embolism. 3. Distended bladder, correlate for outlet obstruction. 4. Advanced for age atherosclerosis with high-grade proximal left subclavian artery stenosis. 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 Electronically Signed   By: Monte Fantasia M.D.   On: 09/21/2015 16:58   Ct Abdomen Pelvis W Contrast  09/21/2015  CLINICAL DATA:  Left-sided chest pain and shortness of breath. Left abdominal pain.  EXAM: CT ANGIOGRAPHY CHEST CT ABDOMEN AND PELVIS WITH CONTRAST TECHNIQUE: Multidetector CT imaging of the chest was performed using the standard protocol during bolus administration of intravenous contrast. Multiplanar CT image reconstructions and MIPs were obtained to evaluate the vascular anatomy. Multidetector CT imaging of the abdomen and pelvis was performed using the standard protocol during bolus administration of intravenous contrast. CONTRAST:  100 cc Isovue 370 intravenous COMPARISON:  None. FINDINGS: CTA CHEST FINDINGS THORACIC INLET/BODY WALL: Subcutaneous reticulation in the left upper back is likely scarring. MEDIASTINUM: Normal heart size. No pericardial effusion. Atherosclerosis with notable proximal left subclavian artery stenosis due to low-density plaque with positive remodeling. Stenosis measures 75% when narrowing as compared to downstream vessel lumen. 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 When accounting for intermittent motion and streak artifact there is no convincing pulmonary embolism. LUNG WINDOWS: Small left pleural effusion with loculation causing scalloping and fissural extension. There is opacification of the lingula and basilar left lower lobe with heterogeneous enhancement and the anterior basilar segment where there is air-filled cavities. No pleural nodularity or enhancement. OSSEOUS: No acute finding. CT ABDOMEN and PELVIS FINDINGS Abdominal wall:  No contributory findings. Hepatobiliary: No focal liver abnormality.No evidence of biliary obstruction or stone. Pancreas: Unremarkable. Spleen: Unremarkable. Adrenals/Urinary Tract: Prominent adrenal thickness enhancement without nodule. No hydronephrosis or stone. Distended bladder without wall thickening. Reproductive:Vas calcifications correlate with diabetes history. Stomach/Bowel:  No obstruction. No inflammation. Vascular/Lymphatic: Advanced for age aortic and iliac atherosclerosis. No acute vascular abnormality. No  mass or adenopathy. Peritoneal: No ascites or pneumoperitoneum. Musculoskeletal: No acute abnormalities. Review of the MIP images confirms the above findings. IMPRESSION: 1. Cavitating left basilar pneumonia. Small to moderate parapneumonic effusion that is partially loculated. 2. Motion degraded CTA without evidence of pulmonary embolism. 3. Distended bladder, correlate for outlet obstruction. 4. Advanced for age atherosclerosis with high-grade proximal left subclavian artery stenosis. 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 Electronically Signed   By: Monte Fantasia M.D.   On: 09/21/2015 16:58   Dg Chest Port 1 View  09/22/2015  CLINICAL DATA:  Sepsis. EXAM: PORTABLE CHEST 1 VIEW COMPARISON:  Radiographs and chest CT yesterday. FINDINGS: Increasing left pleural effusion with associated basilar consolidation. Effusion appears partially loculated laterally. Question of of pan subpulmonic right pleural effusion. Cardiomediastinal contours are unchanged. No pulmonary edema. IMPRESSION: Increasing partially loculated left pleural effusion with associated left basilar consolidation. Question developing small right pleural effusion. Electronically Signed   By: Jeb Levering M.D.   On: 09/22/2015 02:49    Assessment:   Duval Kaeser is a 50 y.o. male With a history of mental retardation admitted with a weeklong illness with fevers chills cough and pleuritic chest pain.  CT scan does show left lower lobe pneumonia with some effusion.  His white  count is elevated.  He has no known TB contacts.  No prior prodrome of illness.  Suspect this is a post influenza or post viral bacterial pneumonia.  He may have an empyema as well  Recommendations Check an MRSA PCR - If positive would start vancomycin Continue levofloxacin for now Attempt to obtain sputum culture Agree with attempted pleural drainage per Dr. Faith Rogue recommendation. Thank you very much for allowing me to participate in the care of this  patient. Please call with questions.   Cheral Marker. Ola Spurr, MD

## 2015-09-22 NOTE — ED Notes (Signed)
Attempted to call report, receiving nurse is busy at this time. Left name and number and awaiting call back to give report.

## 2015-09-23 ENCOUNTER — Encounter: Payer: Self-pay | Admitting: Radiology

## 2015-09-23 ENCOUNTER — Inpatient Hospital Stay: Payer: Medicaid Other

## 2015-09-23 LAB — BODY FLUID CELL COUNT WITH DIFFERENTIAL
Eos, Fluid: 0 %
LYMPHS FL: 5 %
MONOCYTE-MACROPHAGE-SEROUS FLUID: 17 %
NEUTROPHIL FLUID: 50 %
OTHER CELLS FL: 28 %
Total Nucleated Cell Count, Fluid: 64938 cu mm

## 2015-09-23 LAB — BASIC METABOLIC PANEL
ANION GAP: 8 (ref 5–15)
BUN: 24 mg/dL — ABNORMAL HIGH (ref 6–20)
CALCIUM: 7.4 mg/dL — AB (ref 8.9–10.3)
CO2: 19 mmol/L — AB (ref 22–32)
CREATININE: 0.75 mg/dL (ref 0.61–1.24)
Chloride: 105 mmol/L (ref 101–111)
Glucose, Bld: 186 mg/dL — ABNORMAL HIGH (ref 65–99)
Potassium: 4 mmol/L (ref 3.5–5.1)
SODIUM: 132 mmol/L — AB (ref 135–145)

## 2015-09-23 LAB — CBC
HEMATOCRIT: 37.2 % — AB (ref 40.0–52.0)
Hemoglobin: 12.5 g/dL — ABNORMAL LOW (ref 13.0–18.0)
MCH: 29 pg (ref 26.0–34.0)
MCHC: 33.5 g/dL (ref 32.0–36.0)
MCV: 86.4 fL (ref 80.0–100.0)
PLATELETS: 329 10*3/uL (ref 150–440)
RBC: 4.3 MIL/uL — ABNORMAL LOW (ref 4.40–5.90)
RDW: 13.5 % (ref 11.5–14.5)
WBC: 26.8 10*3/uL — AB (ref 3.8–10.6)

## 2015-09-23 LAB — GLUCOSE, CAPILLARY
GLUCOSE-CAPILLARY: 183 mg/dL — AB (ref 65–99)
GLUCOSE-CAPILLARY: 146 mg/dL — AB (ref 65–99)
GLUCOSE-CAPILLARY: 255 mg/dL — AB (ref 65–99)
Glucose-Capillary: 119 mg/dL — ABNORMAL HIGH (ref 65–99)

## 2015-09-23 LAB — RAPID HIV SCREEN (HIV 1/2 AB+AG)
HIV 1/2 ANTIBODIES: NONREACTIVE
HIV-1 P24 Antigen - HIV24: NONREACTIVE

## 2015-09-23 LAB — VANCOMYCIN, TROUGH: Vancomycin Tr: <4 ug/mL — ABNORMAL LOW (ref 10–20)

## 2015-09-23 LAB — PROTEIN, BODY FLUID: TOTAL PROTEIN, FLUID: 4.1 g/dL

## 2015-09-23 LAB — LACTATE DEHYDROGENASE, PLEURAL OR PERITONEAL FLUID: LD, Fluid: 4322 U/L — ABNORMAL HIGH (ref 3–23)

## 2015-09-23 LAB — GLUCOSE, SEROUS FLUID: Glucose, Fluid: <20 mg/dL

## 2015-09-23 MED ORDER — ENOXAPARIN SODIUM 40 MG/0.4ML ~~LOC~~ SOLN
40.0000 mg | SUBCUTANEOUS | Status: DC
  Administered 2015-09-23 – 2015-09-26 (×4): 40 mg via SUBCUTANEOUS
  Filled 2015-09-23 (×4): qty 0.4

## 2015-09-23 MED ORDER — SODIUM CHLORIDE 0.9 % IV SOLN
INTRAVENOUS | Status: DC
  Administered 2015-09-23: 14:00:00 via INTRAVENOUS

## 2015-09-23 MED ORDER — INSULIN ASPART 100 UNIT/ML ~~LOC~~ SOLN: 5.0000 [IU] | Freq: Three times a day (TID) | SUBCUTANEOUS | Status: DC

## 2015-09-23 MED ORDER — METFORMIN HCL 500 MG PO TABS
1000.0000 mg | ORAL_TABLET | Freq: Two times a day (BID) | ORAL | Status: DC
  Administered 2015-09-23 – 2015-09-24 (×2): 1000 mg via ORAL
  Filled 2015-09-23 (×3): qty 2

## 2015-09-23 MED ORDER — MIDAZOLAM HCL 5 MG/5ML IJ SOLN
INTRAMUSCULAR | Status: AC
  Filled 2015-09-23: qty 5

## 2015-09-23 MED ORDER — FENTANYL CITRATE (PF) 100 MCG/2ML IJ SOLN
INTRAMUSCULAR | Status: AC | PRN
  Administered 2015-09-23: 50 ug via INTRAVENOUS

## 2015-09-23 MED ORDER — METOPROLOL TARTRATE 25 MG PO TABS
25.0000 mg | ORAL_TABLET | Freq: Two times a day (BID) | ORAL | Status: DC
  Administered 2015-09-23 – 2015-09-30 (×14): 25 mg via ORAL
  Filled 2015-09-23 (×15): qty 1

## 2015-09-23 MED ORDER — GLUCERNA PO LIQD
237.0000 mL | Freq: Three times a day (TID) | ORAL | Status: DC
  Administered 2015-09-24 – 2015-09-27 (×8): 237 mL via ORAL

## 2015-09-23 MED ORDER — MIDAZOLAM HCL 5 MG/5ML IJ SOLN
INTRAMUSCULAR | Status: AC | PRN
  Administered 2015-09-23: 1 mg via INTRAVENOUS
  Administered 2015-09-23: 0.5 mg via INTRAVENOUS
  Administered 2015-09-23: 1 mg via INTRAVENOUS

## 2015-09-23 MED ORDER — INSULIN GLARGINE 100 UNIT/ML ~~LOC~~ SOLN
40.0000 [IU] | Freq: Every day | SUBCUTANEOUS | Status: DC
  Administered 2015-09-23 – 2015-09-24 (×2): 40 [IU] via SUBCUTANEOUS
  Filled 2015-09-23 (×3): qty 0.4

## 2015-09-23 MED ORDER — FENTANYL CITRATE (PF) 100 MCG/2ML IJ SOLN
INTRAMUSCULAR | Status: AC
  Filled 2015-09-23: qty 4

## 2015-09-23 NOTE — Progress Notes (Signed)
Inpatient Diabetes Program Recommendations  AACE/ADA: New Consensus Statement on Inpatient Glycemic Control (2015)  Target Ranges:  Prepandial:   less than 140 mg/dL      Peak postprandial:   less than 180 mg/dL (1-2 hours)      Critically ill patients:  140 - 180 mg/dL   Review of Glycemic Control  Results for Aileen PilotMUMTAZ, Garth (MRN [id number]) as of 09/23/2015 09:35  Ref. Range 09/22/2015 07:52 09/22/2015 11:02 09/22/2015 16:38 09/22/2015 22:09 09/23/2015 07:38  Glucose-Capillary Latest Ref Range: 65-99 mg/dL 829220 (H) 562297 (H) 130314 (H) 349 (H) 183 (H)    Diabetes history: Type 2 Outpatient Diabetes medications: Lantus 36 units qhs, Metformin 1000mg  bid Current orders for Inpatient glycemic control: Lantus 40 units qhs, Novolog 0-15 units tid, Novolog 0-5 units qhs, Metformin 1000mg  bid  Inpatient Diabetes Program Recommendations: Consider checking an A1C to determine pre- admission A1C.  Consider starting Novolog 5 units tid with meals (post prandial blood sugars remain elevated despite current dosing) and continue Novolog correction as ordered.   Susette RacerJulie Domino Holten, RN, BA, MHA, CDE Diabetes Coordinator Inpatient Diabetes Program  [phone] (Team Pager) [phone] [facility] Office) 09/23/2015 9:41 AM

## 2015-09-23 NOTE — Progress Notes (Signed)
Interpreter services offered to work with patient this admission, but due to his history of mental disabilities he is not able to fully comprehend. Bilingual brother is at bedside communicating with family and care team members. They do not desire interpreter services at this time.

## 2015-09-23 NOTE — Progress Notes (Signed)
Spoke with Dr. Allena KatzPatel regarding no order for code status, but her note stated full code. MD said she would update order.

## 2015-09-23 NOTE — Progress Notes (Signed)
Per Dr. Allena KatzPatel, ok to resume diet when patient returns from procedure.

## 2015-09-23 NOTE — Progress Notes (Signed)
Patient back to room from procedure. Chest tube suction set up by Specials RN. Patient sleeping, no signs of pain or discomfort. Per Specials RN, family went to take a break but will be back in a little while. Bed alarm on. Will continue to monitor.

## 2015-09-23 NOTE — Progress Notes (Signed)
Per Dr Allena KatzPatel, ok to give morning meds, but hold metformin until after procedure.

## 2015-09-23 NOTE — Consult Note (Signed)
[facility] [place] Pulmonary Medicine Consultation      Date: 09/23/2015,   MRN# [id number] Sean [name] December 19, 1965 Code Status:  Code Status History    This patient does not have a recorded code status. Please follow your organizational policy for patients in this situation.     Hosp day:@LENGTHOFSTAYDAYS @ Referring MD: @ATDPROV @     PCP:      AdmissionWeight: 125 lb 10.6 oz (57 kg)                 CurrentWeight: 125 lb 10.6 oz (57 kg) Sean [name] is a 50 y.o. old male seen in consultation for left sided pneumonia at the request of Dr. Allena Katz     CHIEF COMPLAINT:   Acute pneumonia   HISTORY OF PRESENT ILLNESS  Remains febrile, plan for US thoracentesis and plan  for  percutaneous pigtail catheter drainage for both sampling and for intrapleural thrombolytics.   WBC increased to 26   reports of family refusing interpreter  REVIEW OF SYSTEMS   Review of Systems  Unable to perform ROS: medical condition     VS: BP 128/65 mmHg  Pulse 103  Temp(Src) 99.8 F (37.7 C) (Oral)  Resp 18  Ht 5\' 6"  (1.676 m)  Wt 125 lb 10.6 oz (57 kg)  BMI 20.29 kg/m2  SpO2 95%     PHYSICAL EXAM  Physical Exam  Constitutional: He appears well-developed and well-nourished. No distress.  HENT:  Head: Normocephalic and atraumatic.  Mouth/Throat: No oropharyngeal exudate.  Eyes: EOM are normal. Pupils are equal, round, and reactive to light. No scleral icterus.  Neck: Normal range of motion. Neck supple.  Cardiovascular: Normal rate, regular rhythm and normal heart sounds.   No murmur heard. Pulmonary/Chest: No stridor. No respiratory distress. He has no wheezes. He has rales.  Abdominal: Soft. Bowel sounds are normal.  Musculoskeletal: Normal range of motion. He exhibits no edema.  Neurological: He is alert.  Skin: Skin is warm. He is diaphoretic.  Psychiatric: He has a normal mood and affect.        LABS    Recent Labs     09/21/15  1319  09/22/15  0400  09/23/15  0445    HGB  13.3  12.0*  12.5*  HCT  40.3  36.0*  37.2*  MCV  88.8  87.8  86.4  WBC  14.9*  20.5*  26.8*  BUN  29*  20  24*  CREATININE  1.13  0.82  0.75  GLUCOSE  340*  264*  186*  CALCIUM  9.1  7.6*  7.4*  INR  1.18   --    --   ,       CULTURE RESULTS   Recent Results (from the past 240 hour(s))  Urine culture     Status: None   Collection Time: 09/21/15  1:19 PM  Result Value Ref Range Status   Specimen Description URINE, RANDOM  Final   Special Requests NONE  Final   Culture NO GROWTH 1 DAY  Final   Report Status 09/22/2015 FINAL  Final  Blood Culture (routine x 2)     Status: None (Preliminary result)   Collection Time: 09/21/15  3:34 PM  Result Value Ref Range Status   Specimen Description BLOOD LEFT ASSIST CONTROL  Final   Special Requests BOTTLES DRAWN AEROBIC AND ANAEROBIC  1CC  Final   Culture NO GROWTH 2 DAYS  Final   Report Status PENDING  Incomplete  Rapid Influenza A&B Antigens (  [facility] only)     Status: None   Collection Time: 09/21/15  3:34 PM  Result Value Ref Range Status   Influenza A ([facility]) NEGATIVE NEGATIVE Final   Influenza B ([facility]) NEGATIVE NEGATIVE Final  Blood Culture (routine x 2)     Status: None (Preliminary result)   Collection Time: 09/21/15  3:40 PM  Result Value Ref Range Status   Specimen Description BLOOD RIGHT ASSIST CONTROL  Final   Special Requests BOTTLES DRAWN AEROBIC AND ANAEROBIC  1CC  Final   Culture NO GROWTH 2 DAYS  Final   Report Status PENDING  Incomplete  MRSA PCR Screening     Status: None   Collection Time: 09/22/15  3:42 PM  Result Value Ref Range Status   MRSA by PCR NEGATIVE NEGATIVE Final    Comment:        The GeneXpert MRSA Assay (FDA approved for NASAL specimens only), is one component of a comprehensive MRSA colonization surveillance program. It is not intended to diagnose MRSA infection nor to guide or monitor treatment for MRSA infections.           IMAGING    Dg Chest 2 View  09/21/2015  CLINICAL  DATA:  Left rib pain without reported injury. EXAM: CHEST  2 VIEW COMPARISON:  None. FINDINGS: The heart size and mediastinal contours are within normal limits. Right lung is clear. Mild to moderate left pleural effusion is noted with probable underlying atelectasis or infiltrate. The visualized skeletal structures are unremarkable. IMPRESSION: Mild to moderate left pleural effusion with probable underlying atelectasis or infiltrate. Electronically Signed   By: Lupita RaiderJames  Green Jr, M.D.   On: 09/21/2015 15:03   Ct Angio Chest Pe W/cm &/or Wo Cm  09/21/2015  CLINICAL DATA:  Left-sided chest pain and shortness of breath. Left abdominal pain. EXAM: CT ANGIOGRAPHY CHEST CT ABDOMEN AND PELVIS WITH CONTRAST TECHNIQUE: Multidetector CT imaging of the chest was performed using the standard protocol during bolus administration of intravenous contrast. Multiplanar CT image reconstructions and MIPs were obtained to evaluate the vascular anatomy. Multidetector CT imaging of the abdomen and pelvis was performed using the standard protocol during bolus administration of intravenous contrast. CONTRAST:  100 cc Isovue 370 intravenous COMPARISON:  None. FINDINGS: CTA CHEST FINDINGS THORACIC INLET/BODY WALL: Subcutaneous reticulation in the left upper back is likely scarring. MEDIASTINUM: Normal heart size. No pericardial effusion. Atherosclerosis with notable proximal left subclavian artery stenosis due to low-density plaque with positive remodeling. Stenosis measures 75% when narrowing as compared to downstream vessel lumen. 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 When accounting for intermittent motion and streak artifact there is no convincing pulmonary embolism. LUNG WINDOWS: Small left pleural effusion with loculation causing scalloping and fissural extension. There is opacification of the lingula and basilar left lower lobe with heterogeneous enhancement and the anterior basilar segment where there is air-filled  cavities. No pleural nodularity or enhancement. OSSEOUS: No acute finding. CT ABDOMEN and PELVIS FINDINGS Abdominal wall:  No contributory findings. Hepatobiliary: No focal liver abnormality.No evidence of biliary obstruction or stone. Pancreas: Unremarkable. Spleen: Unremarkable. Adrenals/Urinary Tract: Prominent adrenal thickness enhancement without nodule. No hydronephrosis or stone. Distended bladder without wall thickening. Reproductive:Vas calcifications correlate with diabetes history. Stomach/Bowel:  No obstruction. No inflammation. Vascular/Lymphatic: Advanced for age aortic and iliac atherosclerosis. No acute vascular abnormality. No mass or adenopathy. Peritoneal: No ascites or pneumoperitoneum. Musculoskeletal: No acute abnormalities. Review of the MIP images confirms the above findings. IMPRESSION: 1. Cavitating left basilar pneumonia. Small to  moderate parapneumonic effusion that is partially loculated. 2. Motion degraded CTA without evidence of pulmonary embolism. 3. Distended bladder, correlate for outlet obstruction. 4. Advanced for age atherosclerosis with high-grade proximal left subclavian artery stenosis. 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 Electronically Signed   By: Marnee Spring M.D.   On: 09/21/2015 16:58   Ct Abdomen Pelvis W Contrast  09/21/2015  CLINICAL DATA:  Left-sided chest pain and shortness of breath. Left abdominal pain. EXAM: CT ANGIOGRAPHY CHEST CT ABDOMEN AND PELVIS WITH CONTRAST TECHNIQUE: Multidetector CT imaging of the chest was performed using the standard protocol during bolus administration of intravenous contrast. Multiplanar CT image reconstructions and MIPs were obtained to evaluate the vascular anatomy. Multidetector CT imaging of the abdomen and pelvis was performed using the standard protocol during bolus administration of intravenous contrast. CONTRAST:  100 cc Isovue 370 intravenous COMPARISON:  None. FINDINGS: CTA CHEST FINDINGS THORACIC  INLET/BODY WALL: Subcutaneous reticulation in the left upper back is likely scarring. MEDIASTINUM: Normal heart size. No pericardial effusion. Atherosclerosis with notable proximal left subclavian artery stenosis due to low-density plaque with positive remodeling. Stenosis measures 75% when narrowing as compared to downstream vessel lumen. 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 When accounting for intermittent motion and streak artifact there is no convincing pulmonary embolism. LUNG WINDOWS: Small left pleural effusion with loculation causing scalloping and fissural extension. There is opacification of the lingula and basilar left lower lobe with heterogeneous enhancement and the anterior basilar segment where there is air-filled cavities. No pleural nodularity or enhancement. OSSEOUS: No acute finding. CT ABDOMEN and PELVIS FINDINGS Abdominal wall:  No contributory findings. Hepatobiliary: No focal liver abnormality.No evidence of biliary obstruction or stone. Pancreas: Unremarkable. Spleen: Unremarkable. Adrenals/Urinary Tract: Prominent adrenal thickness enhancement without nodule. No hydronephrosis or stone. Distended bladder without wall thickening. Reproductive:Vas calcifications correlate with diabetes history. Stomach/Bowel:  No obstruction. No inflammation. Vascular/Lymphatic: Advanced for age aortic and iliac atherosclerosis. No acute vascular abnormality. No mass or adenopathy. Peritoneal: No ascites or pneumoperitoneum. Musculoskeletal: No acute abnormalities. Review of the MIP images confirms the above findings. IMPRESSION: 1. Cavitating left basilar pneumonia. Small to moderate parapneumonic effusion that is partially loculated. 2. Motion degraded CTA without evidence of pulmonary embolism. 3. Distended bladder, correlate for outlet obstruction. 4. Advanced for age atherosclerosis with high-grade proximal left subclavian artery stenosis. 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  Electronically Signed   By: Marnee Spring M.D.   On: 09/21/2015 16:58   Dg Chest Port 1 View  09/22/2015  CLINICAL DATA:  Sepsis. EXAM: PORTABLE CHEST 1 VIEW COMPARISON:  Radiographs and chest CT yesterday. FINDINGS: Increasing left pleural effusion with associated basilar consolidation. Effusion appears partially loculated laterally. Question of of pan subpulmonic right pleural effusion. Cardiomediastinal contours are unchanged. No pulmonary edema. IMPRESSION: Increasing partially loculated left pleural effusion with associated left basilar consolidation. Question developing small right pleural effusion. Electronically Signed   By: Rubye Oaks M.D.   On: 09/22/2015 02:49        ASSESSMENT/PLAN   50 yo [place] male with acute fevers and chills sepsis with acute left sided pneumonia with effusion, this may progress to necrotizing pneumonia/empyema  1.continue oxygen as needed 2.continue abx as prescribed  3.thoracentesis/pg tail catheter drainage planned and pending     I have personally obtained a history, examined the patient, evaluated laboratory and independently reviewed imaging results, formulated the assessment and plan and placed orders.  The Patient requires high complexity decision making for assessment and support,  frequent evaluation and titration of therapies, application of advanced monitoring technologies and extensive interpretation of multiple databases.    Lucie Leather, M.D.  [facility]  Medical Director [facility] Medical Director [facility] Cardio-Pulmonary Department

## 2015-09-23 NOTE — Progress Notes (Signed)
[facility] INFECTIOUS DISEASE PROGRESS NOTE Date of Admission:  09/21/2015     ID: Sean [name] is a 50 y.o. male with Pna, pleural effusion   Active Problems:   Sepsis (HCC)   Pneumonia   Hyponatremia   Subjective: Febrile to 100.8, still with CP  ROS  Eleven systems are reviewed and negative except per hpi  Medications:  Antibiotics Given (last 72 hours)    Date/Time Action Medication Dose Rate   09/22/15 1143 Given   metroNIDAZOLE (FLAGYL) IVPB 500 mg 500 mg 100 mL/hr   09/22/15 1722 Given   levofloxacin (LEVAQUIN) IVPB 750 mg 750 mg 100 mL/hr   09/22/15 2124 Given   metroNIDAZOLE (FLAGYL) IVPB 500 mg 500 mg 100 mL/hr   09/23/15 0405 Given  [PC]   metroNIDAZOLE (FLAGYL) IVPB 500 mg 500 mg 100 mL/hr   09/23/15 1253 Given   metroNIDAZOLE (FLAGYL) IVPB 500 mg 500 mg 100 mL/hr     . atorvastatin  20 mg Oral QPM  . fentaNYL      . insulin aspart  0-15 Units Subcutaneous TID WC  . insulin aspart  0-5 Units Subcutaneous QHS  . insulin glargine  40 Units Subcutaneous QHS  . levofloxacin (LEVAQUIN) IV  750 mg Intravenous Q24H  . metFORMIN  1,000 mg Oral BID WC  . metoprolol tartrate  25 mg Oral BID  . metronidazole  500 mg Intravenous Q8H  . midazolam        Objective: Vital signs in last 24 hours: Temp:  [98.6 F (37 C)-100.8 F (38.2 C)] 99.8 F (37.7 C) (04/06 1118) Pulse Rate:  [95-131] 103 (04/06 1118) Resp:  [18] 18 (04/06 1118) BP: (96-170)/(60-73) 128/65 mmHg (04/06 1118) SpO2:  [91 %-95 %] 95 % (04/06 1118) Constitutional: thin, nad HENT: perrla Mouth/Throat: Oropharynx is clear and moist. No oropharyngeal exudate.  Cardiovascular: Normal rate, regular rhythm and normal heart sounds. Exam reveals no gallop and no friction rub.  No murmur heard.  Pulmonary/Chest: bibasilar crackles and rhonchi Abdominal: Soft. Bowel sounds are normal. He exhibits no distension. There is no tenderness.  Lymphadenopathy: He has no cervical adenopathy.  Neurological:  He is alert and interactive Skin: Skin is warm and dry. No rash noted. No erythema.  Psychiatric: He has a normal mood and affect. His behavior is normal.   Lab Results  Recent Labs  09/22/15 0400 09/23/15 0445  WBC 20.5* 26.8*  HGB 12.0* 12.5*  HCT 36.0* 37.2*  NA 127* 132*  K 4.5 4.0  CL 99* 105  CO2 19* 19*  BUN 20 24*  CREATININE 0.82 0.75    Microbiology: Results for orders placed or performed during the hospital encounter of 09/21/15  Urine culture     Status: None   Collection Time: 09/21/15  1:19 PM  Result Value Ref Range Status   Specimen Description URINE, RANDOM  Final   Special Requests NONE  Final   Culture NO GROWTH 1 DAY  Final   Report Status 09/22/2015 FINAL  Final  Blood Culture (routine x 2)     Status: None (Preliminary result)   Collection Time: 09/21/15  3:34 PM  Result Value Ref Range Status   Specimen Description BLOOD LEFT ASSIST CONTROL  Final   Special Requests BOTTLES DRAWN AEROBIC AND ANAEROBIC  1CC  Final   Culture NO GROWTH 2 DAYS  Final   Report Status PENDING  Incomplete  Rapid Influenza A&B Antigens ([facility] only)     Status: None   Collection Time:  09/21/15  3:34 PM  Result Value Ref Range Status   Influenza A ([facility]) NEGATIVE NEGATIVE Final   Influenza B ([facility]) NEGATIVE NEGATIVE Final  Blood Culture (routine x 2)     Status: None (Preliminary result)   Collection Time: 09/21/15  3:40 PM  Result Value Ref Range Status   Specimen Description BLOOD RIGHT ASSIST CONTROL  Final   Special Requests BOTTLES DRAWN AEROBIC AND ANAEROBIC  1CC  Final   Culture NO GROWTH 2 DAYS  Final   Report Status PENDING  Incomplete  MRSA PCR Screening     Status: None   Collection Time: 09/22/15  3:42 PM  Result Value Ref Range Status   MRSA by PCR NEGATIVE NEGATIVE Final    Comment:        The GeneXpert MRSA Assay (FDA approved for NASAL specimens only), is one component of a comprehensive MRSA colonization surveillance program. It is not intended  to diagnose MRSA infection nor to guide or monitor treatment for MRSA infections.     Studies/Results: Dg Chest 2 View  09/21/2015  CLINICAL DATA:  Left rib pain without reported injury. EXAM: CHEST  2 VIEW COMPARISON:  None. FINDINGS: The heart size and mediastinal contours are within normal limits. Right lung is clear. Mild to moderate left pleural effusion is noted with probable underlying atelectasis or infiltrate. The visualized skeletal structures are unremarkable. IMPRESSION: Mild to moderate left pleural effusion with probable underlying atelectasis or infiltrate. Electronically Signed   By: Lupita RaiderJames  Green Jr, M.D.   On: 09/21/2015 15:03   Ct Angio Chest Pe W/cm &/or Wo Cm  09/21/2015  CLINICAL DATA:  Left-sided chest pain and shortness of breath. Left abdominal pain. EXAM: CT ANGIOGRAPHY CHEST CT ABDOMEN AND PELVIS WITH CONTRAST TECHNIQUE: Multidetector CT imaging of the chest was performed using the standard protocol during bolus administration of intravenous contrast. Multiplanar CT image reconstructions and MIPs were obtained to evaluate the vascular anatomy. Multidetector CT imaging of the abdomen and pelvis was performed using the standard protocol during bolus administration of intravenous contrast. CONTRAST:  100 cc Isovue 370 intravenous COMPARISON:  None. FINDINGS: CTA CHEST FINDINGS THORACIC INLET/BODY WALL: Subcutaneous reticulation in the left upper back is likely scarring. MEDIASTINUM: Normal heart size. No pericardial effusion. Atherosclerosis with notable proximal left subclavian artery stenosis due to low-density plaque with positive remodeling. Stenosis measures 75% when narrowing as compared to downstream vessel lumen. 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 When accounting for intermittent motion and streak artifact there is no convincing pulmonary embolism. LUNG WINDOWS: Small left pleural effusion with loculation causing scalloping and fissural extension. There is  opacification of the lingula and basilar left lower lobe with heterogeneous enhancement and the anterior basilar segment where there is air-filled cavities. No pleural nodularity or enhancement. OSSEOUS: No acute finding. CT ABDOMEN and PELVIS FINDINGS Abdominal wall:  No contributory findings. Hepatobiliary: No focal liver abnormality.No evidence of biliary obstruction or stone. Pancreas: Unremarkable. Spleen: Unremarkable. Adrenals/Urinary Tract: Prominent adrenal thickness enhancement without nodule. No hydronephrosis or stone. Distended bladder without wall thickening. Reproductive:Vas calcifications correlate with diabetes history. Stomach/Bowel:  No obstruction. No inflammation. Vascular/Lymphatic: Advanced for age aortic and iliac atherosclerosis. No acute vascular abnormality. No mass or adenopathy. Peritoneal: No ascites or pneumoperitoneum. Musculoskeletal: No acute abnormalities. Review of the MIP images confirms the above findings. IMPRESSION: 1. Cavitating left basilar pneumonia. Small to moderate parapneumonic effusion that is partially loculated. 2. Motion degraded CTA without evidence of pulmonary embolism. 3. Distended bladder, correlate for  outlet obstruction. 4. Advanced for age atherosclerosis with high-grade proximal left subclavian artery stenosis. 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 Electronically Signed   By: Marnee Spring M.D.   On: 09/21/2015 16:58   Ct Abdomen Pelvis W Contrast  09/21/2015  CLINICAL DATA:  Left-sided chest pain and shortness of breath. Left abdominal pain. EXAM: CT ANGIOGRAPHY CHEST CT ABDOMEN AND PELVIS WITH CONTRAST TECHNIQUE: Multidetector CT imaging of the chest was performed using the standard protocol during bolus administration of intravenous contrast. Multiplanar CT image reconstructions and MIPs were obtained to evaluate the vascular anatomy. Multidetector CT imaging of the abdomen and pelvis was performed using the standard protocol during bolus  administration of intravenous contrast. CONTRAST:  100 cc Isovue 370 intravenous COMPARISON:  None. FINDINGS: CTA CHEST FINDINGS THORACIC INLET/BODY WALL: Subcutaneous reticulation in the left upper back is likely scarring. MEDIASTINUM: Normal heart size. No pericardial effusion. Atherosclerosis with notable proximal left subclavian artery stenosis due to low-density plaque with positive remodeling. Stenosis measures 75% when narrowing as compared to downstream vessel lumen. 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 When accounting for intermittent motion and streak artifact there is no convincing pulmonary embolism. LUNG WINDOWS: Small left pleural effusion with loculation causing scalloping and fissural extension. There is opacification of the lingula and basilar left lower lobe with heterogeneous enhancement and the anterior basilar segment where there is air-filled cavities. No pleural nodularity or enhancement. OSSEOUS: No acute finding. CT ABDOMEN and PELVIS FINDINGS Abdominal wall:  No contributory findings. Hepatobiliary: No focal liver abnormality.No evidence of biliary obstruction or stone. Pancreas: Unremarkable. Spleen: Unremarkable. Adrenals/Urinary Tract: Prominent adrenal thickness enhancement without nodule. No hydronephrosis or stone. Distended bladder without wall thickening. Reproductive:Vas calcifications correlate with diabetes history. Stomach/Bowel:  No obstruction. No inflammation. Vascular/Lymphatic: Advanced for age aortic and iliac atherosclerosis. No acute vascular abnormality. No mass or adenopathy. Peritoneal: No ascites or pneumoperitoneum. Musculoskeletal: No acute abnormalities. Review of the MIP images confirms the above findings. IMPRESSION: 1. Cavitating left basilar pneumonia. Small to moderate parapneumonic effusion that is partially loculated. 2. Motion degraded CTA without evidence of pulmonary embolism. 3. Distended bladder, correlate for outlet obstruction. 4.  Advanced for age atherosclerosis with high-grade proximal left subclavian artery stenosis. 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 Electronically Signed   By: Marnee Spring M.D.   On: 09/21/2015 16:58   Dg Chest Port 1 View  09/22/2015  CLINICAL DATA:  Sepsis. EXAM: PORTABLE CHEST 1 VIEW COMPARISON:  Radiographs and chest CT yesterday. FINDINGS: Increasing left pleural effusion with associated basilar consolidation. Effusion appears partially loculated laterally. Question of of pan subpulmonic right pleural effusion. Cardiomediastinal contours are unchanged. No pulmonary edema. IMPRESSION: Increasing partially loculated left pleural effusion with associated left basilar consolidation. Question developing small right pleural effusion. Electronically Signed   By: Rubye Oaks M.D.   On: 09/22/2015 02:49    Assessment/Plan: Sargon Carmean is a 50 y.o. male With a history of mental retardation admitted with a weeklong illness with fevers chills cough and pleuritic chest pain. CT scan does show left lower lobe pneumonia with some effusion. His white count is elevated. He has no known TB contacts. No prior prodrome of illness. Suspect this is a post influenza or post viral bacterial pneumonia. He may have an empyema as well MRSA PCR neg, BCX neg. No sputum yet.  Recommendations Continue levofloxacin and flagyl for now Attempt to obtain sputum culture Agree with attempted pleural drainage per Dr. Inez Catalina recommendation. Thank you very much for the  consult. Will follow with you.  FITZGERALD, DAVID P   09/23/2015, 1:43 PM

## 2015-09-23 NOTE — Procedures (Signed)
CT guided placement of 10 French drain in left posterior pleural fluid.  Peach colored cloudy fluid removed, suggestive for empyema.  Unable to place larger drain due to patient discomfort and difficulty advancing catheter into pleural space.  No immediate complication.  Minimal blood loss.

## 2015-09-23 NOTE — Care Management (Signed)
CM consult for discharge planning. Patient with mental retardation who lives with his brother. He is from [place] and has lived here for 9 years. His brother interprets for him. They have declined an interpreter. PCP is Dr. Letta PateAycock. He has medicaid.  LLL cavitory PNA. IR to place a pigtail cath today due to possible empyema. Nursing reports patient is to have a Thoracentesis today due to a pleural effusion. Remains febrile. HR up to the 130's. Following. No family at bedside. Will attempt to reach brother.

## 2015-09-23 NOTE — Progress Notes (Signed)
Subjective: Radiology has been consulted for placement of pigtail chest drain.  Patient has left lung pneumonia and loculated left pleural effusion.  Patient does not speak [place] well.  Procedure was discussed with patient's brother.  Objective: Vital signs in last 24 hours: Temp:  [98.6 F (37 C)-100.8 F (38.2 C)] 99.8 F (37.7 C) (04/06 1118) Pulse Rate:  [95-131] 121 (04/06 1534) Resp:  [18-39] 28 (04/06 1535) BP: (96-170)/(60-77) 149/77 mmHg (04/06 1520) SpO2:  [88 %-100 %] 90 % (04/06 1534) Last BM Date: 09/22/15  Intake/Output from previous day: 04/05 0701 - 04/06 0700 In: 150 [P.O.:150] Out: 200 [Urine:200] Intake/Output this shift:   Heart: RRR Lungs:  Clear on right side.  Decreased breath sounds on left  Lab Results:   Recent Labs  09/22/15 0400 09/23/15 0445  WBC 20.5* 26.8*  HGB 12.0* 12.5*  HCT 36.0* 37.2*  PLT 327 329   BMET  Recent Labs  09/22/15 0400 09/23/15 0445  NA 127* 132*  K 4.5 4.0  CL 99* 105  CO2 19* 19*  GLUCOSE 264* 186*  BUN 20 24*  CREATININE 0.82 0.75  CALCIUM 7.6* 7.4*   PT/INR  Recent Labs  09/21/15 1319  LABPROT 15.2*  INR 1.18   ABG  Recent Labs  09/21/15 1534  HCO3 16.8*    Studies/Results: Ct Angio Chest Pe W/cm &/or Wo Cm  09/21/2015  CLINICAL DATA:  Left-sided chest pain and shortness of breath. Left abdominal pain. EXAM: CT ANGIOGRAPHY CHEST CT ABDOMEN AND PELVIS WITH CONTRAST TECHNIQUE: Multidetector CT imaging of the chest was performed using the standard protocol during bolus administration of intravenous contrast. Multiplanar CT image reconstructions and MIPs were obtained to evaluate the vascular anatomy. Multidetector CT imaging of the abdomen and pelvis was performed using the standard protocol during bolus administration of intravenous contrast. CONTRAST:  100 cc Isovue 370 intravenous COMPARISON:  None. FINDINGS: CTA CHEST FINDINGS THORACIC INLET/BODY WALL: Subcutaneous reticulation in the left  upper back is likely scarring. MEDIASTINUM: Normal heart size. No pericardial effusion. Atherosclerosis with notable proximal left subclavian artery stenosis due to low-density plaque with positive remodeling. Stenosis measures 75% when narrowing as compared to downstream vessel lumen. 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 When accounting for intermittent motion and streak artifact there is no convincing pulmonary embolism. LUNG WINDOWS: Small left pleural effusion with loculation causing scalloping and fissural extension. There is opacification of the lingula and basilar left lower lobe with heterogeneous enhancement and the anterior basilar segment where there is air-filled cavities. No pleural nodularity or enhancement. OSSEOUS: No acute finding. CT ABDOMEN and PELVIS FINDINGS Abdominal wall:  No contributory findings. Hepatobiliary: No focal liver abnormality.No evidence of biliary obstruction or stone. Pancreas: Unremarkable. Spleen: Unremarkable. Adrenals/Urinary Tract: Prominent adrenal thickness enhancement without nodule. No hydronephrosis or stone. Distended bladder without wall thickening. Reproductive:Vas calcifications correlate with diabetes history. Stomach/Bowel:  No obstruction. No inflammation. Vascular/Lymphatic: Advanced for age aortic and iliac atherosclerosis. No acute vascular abnormality. No mass or adenopathy. Peritoneal: No ascites or pneumoperitoneum. Musculoskeletal: No acute abnormalities. Review of the MIP images confirms the above findings. IMPRESSION: 1. Cavitating left basilar pneumonia. Small to moderate parapneumonic effusion that is partially loculated. 2. Motion degraded CTA without evidence of pulmonary embolism. 3. Distended bladder, correlate for outlet obstruction. 4. Advanced for age atherosclerosis with high-grade proximal left subclavian artery stenosis. 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 Electronically Signed   By: Marnee Spring M.D.   On:  09/21/2015 16:58   Ct Abdomen  Pelvis W Contrast  09/21/2015  CLINICAL DATA:  Left-sided chest pain and shortness of breath. Left abdominal pain. EXAM: CT ANGIOGRAPHY CHEST CT ABDOMEN AND PELVIS WITH CONTRAST TECHNIQUE: Multidetector CT imaging of the chest was performed using the standard protocol during bolus administration of intravenous contrast. Multiplanar CT image reconstructions and MIPs were obtained to evaluate the vascular anatomy. Multidetector CT imaging of the abdomen and pelvis was performed using the standard protocol during bolus administration of intravenous contrast. CONTRAST:  100 cc Isovue 370 intravenous COMPARISON:  None. FINDINGS: CTA CHEST FINDINGS THORACIC INLET/BODY WALL: Subcutaneous reticulation in the left upper back is likely scarring. MEDIASTINUM: Normal heart size. No pericardial effusion. Atherosclerosis with notable proximal left subclavian artery stenosis due to low-density plaque with positive remodeling. Stenosis measures 75% when narrowing as compared to downstream vessel lumen. 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 When accounting for intermittent motion and streak artifact there is no convincing pulmonary embolism. LUNG WINDOWS: Small left pleural effusion with loculation causing scalloping and fissural extension. There is opacification of the lingula and basilar left lower lobe with heterogeneous enhancement and the anterior basilar segment where there is air-filled cavities. No pleural nodularity or enhancement. OSSEOUS: No acute finding. CT ABDOMEN and PELVIS FINDINGS Abdominal wall:  No contributory findings. Hepatobiliary: No focal liver abnormality.No evidence of biliary obstruction or stone. Pancreas: Unremarkable. Spleen: Unremarkable. Adrenals/Urinary Tract: Prominent adrenal thickness enhancement without nodule. No hydronephrosis or stone. Distended bladder without wall thickening. Reproductive:Vas calcifications correlate with diabetes history.  Stomach/Bowel:  No obstruction. No inflammation. Vascular/Lymphatic: Advanced for age aortic and iliac atherosclerosis. No acute vascular abnormality. No mass or adenopathy. Peritoneal: No ascites or pneumoperitoneum. Musculoskeletal: No acute abnormalities. Review of the MIP images confirms the above findings. IMPRESSION: 1. Cavitating left basilar pneumonia. Small to moderate parapneumonic effusion that is partially loculated. 2. Motion degraded CTA without evidence of pulmonary embolism. 3. Distended bladder, correlate for outlet obstruction. 4. Advanced for age atherosclerosis with high-grade proximal left subclavian artery stenosis. 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 Electronically Signed   By: Marnee Spring M.D.   On: 09/21/2015 16:58   Dg Chest Port 1 View  09/22/2015  CLINICAL DATA:  Sepsis. EXAM: PORTABLE CHEST 1 VIEW COMPARISON:  Radiographs and chest CT yesterday. FINDINGS: Increasing left pleural effusion with associated basilar consolidation. Effusion appears partially loculated laterally. Question of of pan subpulmonic right pleural effusion. Cardiomediastinal contours are unchanged. No pulmonary edema. IMPRESSION: Increasing partially loculated left pleural effusion with associated left basilar consolidation. Question developing small right pleural effusion. Electronically Signed   By: Rubye Oaks M.D.   On: 09/22/2015 02:49    Anti-infectives: Anti-infectives    Start     Dose/Rate Route Frequency Ordered Stop   09/22/15 1800  levofloxacin (LEVAQUIN) IVPB 750 mg     750 mg 100 mL/hr over 90 Minutes Intravenous Every 24 hours 09/22/15 0203     09/22/15 1100  metroNIDAZOLE (FLAGYL) IVPB 500 mg     500 mg 100 mL/hr over 60 Minutes Intravenous Every 8 hours 09/22/15 1012     09/22/15 0203  vancomycin (VANCOCIN) IVPB 750 mg/150 ml premix  Status:  Discontinued     750 mg 150 mL/hr over 60 Minutes Intravenous Every 12 hours 09/22/15 0203 09/22/15 1012   09/21/15 1545   levofloxacin (LEVAQUIN) IVPB 750 mg     750 mg 100 mL/hr over 90 Minutes Intravenous  Once 09/21/15 1543 09/21/15 1720   09/21/15 1545  vancomycin (VANCOCIN) IVPB 1000 mg/200 mL  premix     1,000 mg 200 mL/hr over 60 Minutes Intravenous  Once 09/21/15 1543 09/21/15 1747      Assessment/Plan: 50 yo with left lung pneumonia and probable empyema.  Admission H & P has been reviewed.  Plan for CT guided left chest tube placement.  CT surgery is following patient and may want to infuse thrombolytics into pleural space after drainage stops.  Procedure discussed with brother and informed consent obtained.  Plan to use moderate sedation.      Gaelyn Tukes Alycia RossettiYAN 09/23/2015

## 2015-09-23 NOTE — Progress Notes (Signed)
Patient ID: Sean [name], male   DOB: 1966/02/07, 50 y.o.   MRN: [id number] [facility] [place] [facility]   PATIENT NAME: Sean [name]    MR#:  [id number]  DATE OF BIRTH:  [birth year]  SUBJECTIVE:  Came in with fever, chills and found to have pneumonia. C/o left sided pleuritic pain, low grade fever  REVIEW OF SYSTEMS:   Review of Systems  Constitutional: Positive for fever. Negative for chills and weight loss.  HENT: Negative for ear discharge, ear pain and nosebleeds.   Eyes: Negative for blurred vision, pain and discharge.  Respiratory: Positive for cough and shortness of breath. Negative for sputum production, wheezing and stridor.   Cardiovascular: Negative for chest pain, palpitations, orthopnea and PND.  Gastrointestinal: Negative for nausea, vomiting, abdominal pain and diarrhea.  Genitourinary: Negative for urgency and frequency.  Musculoskeletal: Negative for back pain and joint pain.  Neurological: Positive for weakness. Negative for sensory change, speech change and focal weakness.  Psychiatric/Behavioral: Negative for depression and hallucinations. The patient is not nervous/anxious.   All other systems reviewed and are negative.  Tolerating Diet:yes Tolerating PT: not needed  DRUG ALLERGIES:   Allergies  Allergen Reactions  . Penicillins Other (See Comments)    Reaction:  Unknown     VITALS:  Blood pressure 154/62, pulse 103, temperature 98.6 F (37 C), temperature source Oral, resp. rate 18, height  (1.676 m), weight 57 kg (125 lb 10.6 oz), SpO2 91 %.  PHYSICAL EXAMINATION:   Physical Exam  GENERAL:  51 y.o.-year-old patient lying in the bed with no acute distress.  EYES: Pupils equal, round, reactive to light and accommodation. No scleral icterus. Extraocular muscles intact.  HEENT: Head atraumatic, normocephalic. Oropharynx and nasopharynx clear.  NECK:  Supple, no jugular venous distention. No thyroid enlargement, no  tenderness.  LUNGS: decreased breath sounds left LL, no wheezing, rales, rhonchi. No use of accessory muscles of respiration.  CARDIOVASCULAR: S1, S2 normal. No murmurs, rubs, or gallops.  ABDOMEN: Soft, nontender, nondistended. Bowel sounds present. No organomegaly or mass.  EXTREMITIES: No cyanosis, clubbing or edema b/l.    NEUROLOGIC: Cranial nerves II through XII are intact. No focal Motor or sensory deficits b/l.   PSYCHIATRIC:  patient is alert and oriented x 3.  SKIN: No obvious rash, lesion, or ulcer.   LABORATORY PANEL:  CBC  Recent Labs Lab 09/23/15 0445  WBC 26.8*  HGB 12.5*  HCT 37.2*  PLT 329    Chemistries   Recent Labs Lab 09/21/15 1319  09/23/15 0445  NA 127*  < > 132*  K 4.9  < > 4.0  CL 90*  < > 105  CO2 18*  < > 19*  GLUCOSE 340*  < > 186*  BUN 29*  < > 24*  CREATININE 1.13  < > 0.75  CALCIUM 9.1  < > 7.4*  AST 41  --   --   ALT 34  --   --   ALKPHOS 69  --   --   BILITOT 1.1  --   --   < > = values in this interval not displayed. Cardiac Enzymes  Recent Labs Lab 09/21/15 2346  TROPONINI 0.08*   RADIOLOGY:  Dg Chest 2 View  09/21/2015  CLINICAL DATA:  Left rib pain without reported injury. EXAM: CHEST  2 VIEW COMPARISON:  None. FINDINGS: The heart size and mediastinal contours are within normal lAileen [place] lung is clear. Mild to moderate left pleural  effusion is noted with probable underlying atelectasis or infiltrate. The visualized skeletal structures are unremarkable. IMPRESSION: Mild to moderate left pleural effusion with probable underlying atelectasis or infiltrate. Electronically Signed   By: Lupita RaiderJames  Green Jr, M.D.   On: 09/21/2015 15:03   Ct Angio Chest Pe W/cm &/or Wo Cm  09/21/2015  CLINICAL DATA:  Left-sided chest pain and shortness of breath. Left abdominal pain. EXAM: CT ANGIOGRAPHY CHEST CT ABDOMEN AND PELVIS WITH CONTRAST TECHNIQUE: Multidetector CT imaging of the chest was performed using the standard protocol during bolus  administration of intravenous contrast. Multiplanar CT image reconstructions and MIPs were obtained to evaluate the vascular anatomy. Multidetector CT imaging of the abdomen and pelvis was performed using the standard protocol during bolus administration of intravenous contrast. CONTRAST:  100 cc Isovue 370 intravenous COMPARISON:  None. FINDINGS: CTA CHEST FINDINGS THORACIC INLET/BODY WALL: Subcutaneous reticulation in the left upper back is likely scarring. MEDIASTINUM: Normal heart size. No pericardial effusion. Atherosclerosis with notable proximal left subclavian artery stenosis due to low-density plaque with positive remodeling. Stenosis measures 75% when narrowing as compared to downstream vessel lumen. 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 When accounting for intermittent motion and streak artifact there is no convincing pulmonary embolism. LUNG WINDOWS: Small left pleural effusion with loculation causing scalloping and fissural extension. There is opacification of the lingula and basilar left lower lobe with heterogeneous enhancement and the anterior basilar segment where there is air-filled cavities. No pleural nodularity or enhancement. OSSEOUS: No acute finding. CT ABDOMEN and PELVIS FINDINGS Abdominal wall:  No contributory findings. Hepatobiliary: No focal liver abnormality.No evidence of biliary obstruction or stone. Pancreas: Unremarkable. Spleen: Unremarkable. Adrenals/Urinary Tract: Prominent adrenal thickness enhancement without nodule. No hydronephrosis or stone. Distended bladder without wall thickening. Reproductive:Vas calcifications correlate with diabetes history. Stomach/Bowel:  No obstruction. No inflammation. Vascular/Lymphatic: Advanced for age aortic and iliac atherosclerosis. No acute vascular abnormality. No mass or adenopathy. Peritoneal: No ascites or pneumoperitoneum. Musculoskeletal: No acute abnormalities. Review of the MIP images confirms the above findings.  IMPRESSION: 1. Cavitating left basilar pneumonia. Small to moderate parapneumonic effusion that is partially loculated. 2. Motion degraded CTA without evidence of pulmonary embolism. 3. Distended bladder, correlate for outlet obstruction. 4. Advanced for age atherosclerosis with high-grade proximal left subclavian artery stenosis. 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 Electronically Signed   By: Marnee SpringJonathon  Watts M.D.   On: 09/21/2015 16:58   Ct Abdomen Pelvis W Contrast  09/21/2015  CLINICAL DATA:  Left-sided chest pain and shortness of breath. Left abdominal pain. EXAM: CT ANGIOGRAPHY CHEST CT ABDOMEN AND PELVIS WITH CONTRAST TECHNIQUE: Multidetector CT imaging of the chest was performed using the standard protocol during bolus administration of intravenous contrast. Multiplanar CT image reconstructions and MIPs were obtained to evaluate the vascular anatomy. Multidetector CT imaging of the abdomen and pelvis was performed using the standard protocol during bolus administration of intravenous contrast. CONTRAST:  100 cc Isovue 370 intravenous COMPARISON:  None. FINDINGS: CTA CHEST FINDINGS THORACIC INLET/BODY WALL: Subcutaneous reticulation in the left upper back is likely scarring. MEDIASTINUM: Normal heart size. No pericardial effusion. Atherosclerosis with notable proximal left subclavian artery stenosis due to low-density plaque with positive remodeling. Stenosis measures 75% when narrowing as compared to downstream vessel lumen. 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 When accounting for intermittent motion and streak artifact there is no convincing pulmonary embolism. LUNG WINDOWS: Small left pleural effusion with loculation causing scalloping and fissural extension. There is opacification of the lingula and basilar  left lower lobe with heterogeneous enhancement and the anterior basilar segment where there is air-filled cavities. No pleural nodularity or enhancement. OSSEOUS: No acute  finding. CT ABDOMEN and PELVIS FINDINGS Abdominal wall:  No contributory findings. Hepatobiliary: No focal liver abnormality.No evidence of biliary obstruction or stone. Pancreas: Unremarkable. Spleen: Unremarkable. Adrenals/Urinary Tract: Prominent adrenal thickness enhancement without nodule. No hydronephrosis or stone. Distended bladder without wall thickening. Reproductive:Vas calcifications correlate with diabetes history. Stomach/Bowel:  No obstruction. No inflammation. Vascular/Lymphatic: Advanced for age aortic and iliac atherosclerosis. No acute vascular abnormality. No mass or adenopathy. Peritoneal: No ascites or pneumoperitoneum. Musculoskeletal: No acute abnormalities. Review of the MIP images confirms the above findings. IMPRESSION: 1. Cavitating left basilar pneumonia. Small to moderate parapneumonic effusion that is partially loculated. 2. Motion degraded CTA without evidence of pulmonary embolism. 3. Distended bladder, correlate for outlet obstruction. 4. Advanced for age atherosclerosis with high-grade proximal left subclavian artery stenosis. 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 Electronically Signed   By: Marnee Spring M.D.   On: 09/21/2015 16:58   Dg Chest Port 1 View  09/22/2015  CLINICAL DATA:  Sepsis. EXAM: PORTABLE CHEST 1 VIEW COMPARISON:  Radiographs and chest CT yesterday. FINDINGS: Increasing left pleural effusion with associated basilar consolidation. Effusion appears partially loculated laterally. Question of of pan subpulmonic right pleural effusion. Cardiomediastinal contours are unchanged. No pulmonary edema. IMPRESSION: Increasing partially loculated left pleural effusion with associated left basilar consolidation. Question developing small right pleural effusion. Electronically Signed   By: Rubye Oaks M.D.   On: 09/22/2015 02:49   ASSESSMENT AND PLAN:  Sean [name] is a 50 y.o. male with a known history of Hypertension and diabetes and mental retardation.  The patient cannot provide any information. According to his brother, who was living with him, the patient got a flu from her sister 2 days ago. He started to have a fever, chills, cough with sputum, shortness of breath and chest pain  1.Sepsis with  left LL pneumonia and pleural effusion/loculated empyema (leukocytosis, tachycardia and fever) -cont levaquin and flagyl -d/ced vanc -MRSA PCR negative -BC negative so far -CT chest shows loculated fluid collection -appreciated dr Thelma Barge input.  - IR to place pigtail catheter today for possible empyema and sent fluid for C/s -wbc 20K--14K--26K  2.Lactic acidosis. Follow-up lactic acid level and supportive care. -trending down -supportive care -IVF  3.Elevated troponin. Possible due to demanding ischemia, follow-up troponin level, start aspirin and continue statin.  4.Hyponatremia. Continue normal saline IV and follow-up BMP.  5.Diabetes. Start sliding scale and resume metformin.continue Lantus increase dose to 40 units  6. Relative hypotension in pt with H/o Hypertension.  -resolved. -metoprolol bid for tachycardia -add lisinopril if bp allows  Case discussed with Care Management/Social Worker. Management plans discussed with the patient, family and they are in agreement.  CODE STATUS: full  DVT Prophylaxis: lovenox  TOTAL TIME TAKING CARE OF THIS PATIENT:30 minutes.  >50% time spent on counselling and coordination of care with pt's brother iqbal  POSSIBLE D/C IN next few days DAYS, DEPENDING ON CLINICAL CONDITION.  Note: This dictation was prepared with Dragon dictation along with smaller phrase technology. Any transcriptional errors that result from this process are unintentional.  Jacion Dismore M.D on 09/23/2015 at 7:34 AM  Between 7am to 6pm - Pager - [phone]  After 6pm go to [email or web address] - password [email or web address] [facility]  [facility]  Office  [phone]  CC: Primary care physician; Emogene Morgan, MD

## 2015-09-24 ENCOUNTER — Inpatient Hospital Stay: Payer: Medicaid Other

## 2015-09-24 ENCOUNTER — Encounter: Payer: Self-pay | Admitting: Radiology

## 2015-09-24 DIAGNOSIS — J869 Pyothorax without fistula: Secondary | ICD-10-CM

## 2015-09-24 LAB — GLUCOSE, CAPILLARY
GLUCOSE-CAPILLARY: 92 mg/dL (ref 65–99)
Glucose-Capillary: 124 mg/dL — ABNORMAL HIGH (ref 65–99)
Glucose-Capillary: 102 mg/dL — ABNORMAL HIGH (ref 65–99)
Glucose-Capillary: 152 mg/dL — ABNORMAL HIGH (ref 65–99)

## 2015-09-24 LAB — CBC
HEMATOCRIT: 32.4 % — AB (ref 40.0–52.0)
HEMOGLOBIN: 10.8 g/dL — AB (ref 13.0–18.0)
MCH: 29 pg (ref 26.0–34.0)
MCHC: 33.2 g/dL (ref 32.0–36.0)
MCV: 87.2 fL (ref 80.0–100.0)
Platelets: 340 10*3/uL (ref 150–440)
RBC: 3.71 MIL/uL — AB (ref 4.40–5.90)
RDW: 14.1 % (ref 11.5–14.5)
WBC: 24.9 10*3/uL — ABNORMAL HIGH (ref 3.8–10.6)

## 2015-09-24 MED ORDER — FENTANYL CITRATE (PF) 100 MCG/2ML IJ SOLN
INTRAMUSCULAR | Status: AC | PRN
  Administered 2015-09-24: 25 ug via INTRAVENOUS
  Administered 2015-09-24: 50 ug via INTRAVENOUS

## 2015-09-24 MED ORDER — MIDAZOLAM HCL 5 MG/5ML IJ SOLN
INTRAMUSCULAR | Status: AC | PRN
  Administered 2015-09-24 (×3): 1 mg via INTRAVENOUS

## 2015-09-24 MED ORDER — MIDAZOLAM HCL 5 MG/5ML IJ SOLN
INTRAMUSCULAR | Status: AC
  Filled 2015-09-24: qty 5

## 2015-09-24 MED ORDER — SODIUM CHLORIDE 0.9 % IJ SOLN
Freq: Once | INTRAMUSCULAR | Status: AC
  Administered 2015-09-24: 10:00:00 via INTRAPLEURAL
  Filled 2015-09-24: qty 10

## 2015-09-24 MED ORDER — FENTANYL CITRATE (PF) 100 MCG/2ML IJ SOLN
INTRAMUSCULAR | Status: AC
  Filled 2015-09-24: qty 4

## 2015-09-24 MED ORDER — SODIUM CHLORIDE 0.9 % IV SOLN
INTRAVENOUS | Status: DC
  Administered 2015-09-24: 12:00:00 via INTRAVENOUS

## 2015-09-24 NOTE — Consult Note (Signed)
Pharmacy Antibiotic Note  Sean [name] is a 50 y.o. male admitted on 09/21/2015 with pneumonia and pleural effusion.  Pharmacy has been consulted for levofloxacin  Plan: Will continue levofloxacin 750mg  IV Q24H. Patient also on metronidazole 500mg  IV Q8H. ID following  Height: 5\' 6"  (167.6 cm) Weight: 125 lb 10.6 oz (57 kg) IBW/kg (Calculated) : 63.8  Temp (24hrs), Avg:98.9 F (37.2 C), Min:97.6 F (36.4 C), Max:99.9 F (37.7 C)   Recent Labs Lab 09/21/15 1319 09/21/15 1534 09/21/15 1813 09/21/15 2346 09/22/15 0400 09/23/15 0445 09/23/15 1038 09/24/15 0618  WBC 14.9*  --   --   --  20.5* 26.8*  --  24.9*  CREATININE 1.13  --   --   --  0.82 0.75  --   --   LATICACIDVEN  --  3.3* 2.6* 1.6  --   --   --   --   VANCOTROUGH  --   --   --   --   --   --  <4*  --     Estimated Creatinine Clearance: 90.1 mL/min (by C-G formula based on Cr of 0.75).    Allergies  Allergen Reactions  . Penicillins Other (See Comments)    Reaction:  Unknown     Antimicrobials this admission: vancomycin 4/4 >>4/4  levofloxacin 4/4 >>  Metronidazole 4/5   Microbiology results: 4/4 Blood x 2 - NGTD 4/4 UCx: NG, final 4/6 Pleural fluid  MRSA PCR negative   Thank you for allowing pharmacy to be a part of this patient's care.  Olene FlossMelissa D Maccia, Pharm.D Clinical Pharmacist   09/24/2015 11:45 AM

## 2015-09-24 NOTE — Progress Notes (Signed)
Inpatient Diabetes Program Recommendations  AACE/ADA: New Consensus Statement on Inpatient Glycemic Control (2015)  Target Ranges:  Prepandial:   less than 140 mg/dL      Peak postprandial:   less than 180 mg/dL (1-2 hours)      Critically ill patients:  140 - 180 mg/dL   Review of Glycemic Control Results for [name], Jonta (MRN [id number]) as of 09/24/2015 10:42  Ref. Range 09/23/2015 07:38 09/23/2015 11:42 09/23/2015 17:06 09/23/2015 22:01 09/24/2015 07:20  Glucose-Capillary Latest Ref Range: 65-99 mg/dL 409183 (H) 811146 (H) 914119 (H) 255 (H) 124 (H)      Diabetes history: Type 2 Outpatient Diabetes medications: Lantus 36 units qhs, Metformin 1000mg  bid Current orders for Inpatient glycemic control: Lantus 40 units qhs, Novolog 0-15 units tid, Novolog 0-5 units qhs, Metformin 1000mg  bid  Inpatient Diabetes Program Recommendations: Consider checking an A1C to determine pre- admission A1C. Consider starting Novolog 2 units tid with meals (post prandial blood sugars remain elevated despite current dosing) and continue Novolog correction as ordered.   Susette RacerJulie Avril Busser, RN, BA, MHA, CDE Diabetes Coordinator Inpatient Diabetes Program  [phone] (Team Pager) [phone] [facility] Office) 09/24/2015 10:43 AM

## 2015-09-24 NOTE — Plan of Care (Signed)
Problem: Safety: Goal: Ability to remain free from injury will improve Outcome: Progressing Patient remains high falls, bed alarm set to sensitive as patient can be impulsive at times when family not at bedside. Frequent rounding for safety and assess toileting.   Problem: Respiratory: Goal: Respiratory status will improve Outcome: Progressing Remains on 4L O2, Pleuravac to left chest chest in place, draining thin, serousanguineous fluid. Insertion site WNL.  Goal: Pain level will decrease Outcome: Progressing No evidence of pain.

## 2015-09-24 NOTE — Progress Notes (Signed)
Sean [name] Inpatient Post-Op Note  Patient ID: Sean [name], male   DOB: [birth year], 50 y.o.   MRN: [id number]  HISTORY: The patient tolerated his percutaneous drainage catheter well. The patient's brother was present at the bedside this morning and according to the brother the patient continues to have some discomfort in his left hemithorax. He also complains of some shortness of breath.   Filed Vitals:   09/24/15 0831 09/24/15 0834  BP: 117/68 117/68  Pulse: 101 101  Temp: 98.4 F (36.9 C)   Resp: 19      EXAM: Resp: Lungs are diminished bilaterally.  No respiratory distress, normal effort. Heart:  Regular without murmurs Skin: Skin is warm and dry. No rash noted. No diaphoretic. No erythema. No pallor.     ASSESSMENT: There is no air leak from the Pleur-evac. The drainage is serous now but there is some purulent appearing material in the Pleur-evac itself. The skin site is clean and dry. The chest x-ray today shows perhaps a slight improvement but there appears to be some pleural fluid remaining. I discussed with the patient's brother the option of placing intrapleural thrombolytics and he is agreeable to that.   PLAN:   I will place intrapleural thrombolytics today. We will leave this to well for 6 hours prior to unclamping the tube. We will follow serial chest x-rays and physical exam. If the intrapleural thrombolytics do not work then he will require surgical intervention.    Hulda Marinimothy Dealie Koelzer, MD

## 2015-09-24 NOTE — Progress Notes (Signed)
Unable to infuse TPA through tube.  Appears that tube is nonfuctional.  Discussed with IR and they will try to exchange tube for a new one.  [employer] Kaoru Benda.

## 2015-09-24 NOTE — Progress Notes (Signed)
Patient ID: Sean [name], male   DOB: 03-19-1966, 50 y.o.   MRN: [id number] [facility] [place] [facility]   PATIENT NAME: Sean [name]    MR#:  [id number]  DATE OF BIRTH:  [birth year]  SUBJECTIVE:  Came in with fever, chills and found to have pneumonia. C/o left sided pleuritic pain, low grade fever  REVIEW OF SYSTEMS:   Review of Systems  Constitutional: Positive for fever. Negative for chills and weight loss.  HENT: Negative for ear discharge, ear pain and nosebleeds.   Eyes: Negative for blurred vision, pain and discharge.  Respiratory: Positive for cough and shortness of breath. Negative for sputum production, wheezing and stridor.   Cardiovascular: Negative for chest pain, palpitations, orthopnea and PND.  Gastrointestinal: Negative for nausea, vomiting, abdominal pain and diarrhea.  Genitourinary: Negative for urgency and frequency.  Musculoskeletal: Negative for back pain and joint pain.  Neurological: Positive for weakness. Negative for sensory change, speech change and focal weakness.  Psychiatric/Behavioral: Negative for depression and hallucinations. The patient is not nervous/anxious.   All other systems reviewed and are negative.  Tolerating Diet:yes Tolerating PT pending  DRUG ALLERGIES:   Allergies  Allergen Reactions  . Penicillins Other (See Comments)    Reaction:  Unknown     VITALS:  Blood pressure 117/68, pulse 101, temperature 98.4 F (36.9 C), temperature source Oral, resp. rate 19, height  (1.676 m), weight 57 kg (125 lb 10.6 oz), SpO2 91 %.  PHYSICAL EXAMINATION:   Physical Exam  GENERAL:  50 y.o.-year-old patient lying in the bed with no acute distress.  EYES: Pupils equal, round, reactive to light and accommodation. No scleral icterus. Extraocular muscles intact.  HEENT: Head atraumatic, normocephalic. Oropharynx and nasopharynx clear.  NECK:  Supple, no jugular venous distention. No thyroid enlargement, no  tenderness.  LUNGS: decreased breath sounds left LL, no wheezing, rales, rhonchi. No use of accessory muscles of respiration. Left side chest tube placed CARDIOVASCULAR: S1, S2 normal. No murmurs, rubs, or gallops.  ABDOMEN: Soft, nontender, nondistended. Bowel sounds present. No organomegaly or mass.  EXTREMITIES: No cyanosis, clubbing or edema b/l.    NEUROLOGIC: Cranial nerves II through XII are intact. No focal Motor or sensory deficits b/l.   PSYCHIATRIC:  patient is alert and oriented x 3.  SKIN: No obvious rash, lesion, or ulcer.   LABORATORY PANEL:  CBC  Recent Labs Lab 09/24/15 0618  WBC 24.9*  HGB 10.8*  HCT 32.4*  PLT 340    Chemistries   Recent Labs Lab 09/21/15 1319  09/23/15 0445  NA 127*  < > 132*  K 4.9  < > 4.0  CL 90*  < > 105  CO2 18*  < > 19*  GLUCOSE 340*  < > 186*  BUN 29*  < > 24*  CREATININE 1.13  < > 0.75  CALCIUM 9.1  < > 7.4*  AST 41  --   --   ALT 34  --   --   ALKPHOS 69  --   --   BILITOT 1.1  --   --   < > = values in this interval not displayed. Cardiac Enzymes  Recent Labs Lab 09/21/15 2346  TROPONINI 0.08*   RADIOLOGY:  Dg Chest 2 View  09/24/2015  CLINICAL DATA:  Left-sided chest tube. EXAM: CHEST  2 VIEW COMPARISON:  September 22, 2015. FINDINGS: Stable cardiomediastinal silhouette. No pneumothorax is noted. Interval placement of left-sided pleural drainage catheter. Right lung is  clear. Left basilar opacity is noted concerning for pneumonia or atelectasis with associated pleural effusion which is slightly improved compared to prior exam. Bony thorax is unremarkable. IMPRESSION: Interval placement of left-sided pleural drainage catheter. Slightly improved left basilar opacity as described above. Electronically Signed   By: Lupita Raider, M.D.   On: 09/24/2015 08:21   Ct Image Guided Drainage By Percutaneous Catheter  09/23/2015  INDICATION: 50 year old with left lung pneumonia and probably empyema. Request for placement of a pigtail  pleural drainage catheter. EXAM: CT-GUIDED PLACEMENT OF LEFT PLEURAL CATHETER MEDICATIONS: The patient is currently admitted to the hospital and receiving intravenous antibiotics. The antibiotics were administered within an appropriate time frame prior to the initiation of the procedure. ANESTHESIA/SEDATION: 3.0 mg IV Versed 75 mcg IV Fentanyl Moderate Sedation Time:  35 minutes The patient was continuously monitored during the procedure by the interventional radiology nurse under my direct supervision. COMPLICATIONS: None immediate. TECHNIQUE: Informed written consent was obtained from the patient's brother after a thorough discussion of the procedural risks, benefits and alternatives. All questions were addressed. Maximal Sterile Barrier Technique was utilized including mask, sterile gowns, sterile gloves, sterile drape, hand hygiene and skin antiseptic. A timeout was performed prior to the initiation of the procedure. PROCEDURE: Patient was placed prone on the CT scanner. Images through the chest were obtained. The left posterior chest was prepped with chlorhexidine and sterile field was created. Skin and soft tissues were anesthetized with 1% lidocaine. A 19 gauge Yueh catheter was directed into the pleural space while aspirating. Peach colored fluid was aspirated. A stiff Amplatz wire was advanced into the pleural space. The tract was dilated and attempted to advance a 12 [place] drain over the wire. This drain would not advance easily and it was very uncomfortable for the patient. As a result, 10 [place] drain was advanced over the wire with some difficulty. Catheter placement was confirmed within the pleural space with CT imaging. Catheter was attached to a PleurEvac and additional peach colored cloud fluid was removed. Catheter was sutured to the skin and a dressing was placed. FINDINGS: Loculated left pleural fluid. Pleural catheter was placed in the largest posterior component. Cloudy peach colored fluid was  removed. Findings are concerning for an empyema. IMPRESSION: Successful placement of a left pleural catheter with CT guidance. Cloudy fluid was removed and concerning for an empyema. Fluid was sent for culture and cytology. Electronically Signed   By: Richarda Overlie M.D.   On: 09/23/2015 17:33   ASSESSMENT AND PLAN:  Linden Garraway is a 50 y.o. male with a known history of Hypertension and diabetes and mental retardation. The patient cannot provide any information. According to his brother, who was living with him, the patient got a flu from her sister 2 days ago. He started to have a fever, chills, cough with sputum, shortness of breath and chest pain  1.Sepsis with  left LL pneumonia and pleural effusion/loculated empyema (leukocytosis, tachycardia and fever) -cont levaquin and flagyl -d/ced vanc -MRSA PCR negative -BC negative so far -CT chest shows loculated fluid collection -appreciated dr Thelma Barge input.  - s/p pigtail catheter placement for empyema. Pt may need surgical intervention if it does not drain well -wbc 20K--14K--26K--24K  2.Lactic acidosis. Follow-up lactic acid level and supportive care. -trending down -supportive care -IVF  3.Elevated troponin. Possible due to demanding ischemia, follow-up troponin level, start aspirin and continue statin.  4.Hyponatremia. Continue normal saline IV and follow-up BMP.  5.Diabetes. Start sliding scale and resume metformin.continue  Lantus increase dose to 40 units  6. Relative hypotension in pt with H/o Hypertension.  -resolved. -metoprolol bid for tachycardia -add lisinopril if bp allows  Case discussed with Care Management/Social Worker. Management plans discussed with the patient, family and they are in agreement.  CODE STATUS: full  DVT Prophylaxis: lovenox  TOTAL TIME TAKING CARE OF THIS PATIENT:30 minutes.  >50% time spent on counselling and coordination of care with pt's brother iqbal  POSSIBLE D/C IN next few days DAYS,  DEPENDING ON CLINICAL CONDITION.  Note: This dictation was prepared with Dragon dictation along with smaller phrase technology. Any transcriptional errors that result from this process are unintentional.  Yarah Fuente M.D on 09/24/2015 at 10:51 AM  Between 7am to 6pm - Pager - [phone]  After 6pm go to [email or web address] - password [email or web address] [facility]  [facility]  Office  [phone]  CC: Primary care physician; Emogene MorganAYCOCK, NGWE A, MD

## 2015-09-24 NOTE — Procedures (Signed)
Removal of the retracted left posterior chest tube CT placement of a new left 6010fr Chest tube more inferiorly at the lung base  No comp Stable Full report in PACS KEEP TO lws AT 20CM h2o

## 2015-09-25 DIAGNOSIS — J869 Pyothorax without fistula: Secondary | ICD-10-CM | POA: Insufficient documentation

## 2015-09-25 LAB — GLUCOSE, CAPILLARY
GLUCOSE-CAPILLARY: 56 mg/dL — AB (ref 65–99)
GLUCOSE-CAPILLARY: 69 mg/dL (ref 65–99)
Glucose-Capillary: 30 mg/dL — CL (ref 65–99)
Glucose-Capillary: 148 mg/dL — ABNORMAL HIGH (ref 65–99)
Glucose-Capillary: 239 mg/dL — ABNORMAL HIGH (ref 65–99)
Glucose-Capillary: 233 mg/dL — ABNORMAL HIGH (ref 65–99)
Glucose-Capillary: 142 mg/dL — ABNORMAL HIGH (ref 65–99)

## 2015-09-25 MED ORDER — NYSTATIN 100000 UNIT/ML MT SUSP
5.0000 mL | Freq: Four times a day (QID) | OROMUCOSAL | Status: DC
  Administered 2015-09-25 – 2015-10-09 (×47): 500000 [IU] via ORAL
  Filled 2015-09-25 (×51): qty 5

## 2015-09-25 MED ORDER — SODIUM CHLORIDE 0.9 % IJ SOLN
Freq: Once | INTRAMUSCULAR | Status: AC
  Administered 2015-09-25: 10:00:00 via INTRAPLEURAL
  Filled 2015-09-25: qty 10

## 2015-09-25 MED ORDER — GUAIFENESIN 100 MG/5ML PO SOLN
5.0000 mL | ORAL | Status: DC | PRN
  Administered 2015-09-25: 100 mg via ORAL
  Filled 2015-09-25: qty 10

## 2015-09-25 MED ORDER — BENZONATATE 100 MG PO CAPS
100.0000 mg | ORAL_CAPSULE | Freq: Three times a day (TID) | ORAL | Status: DC
  Administered 2015-09-25 – 2015-09-27 (×9): 100 mg via ORAL
  Filled 2015-09-25 (×9): qty 1

## 2015-09-25 NOTE — Progress Notes (Signed)
Patient has now allowed telemetry to be reapplied and is resting much more comfortably after pain medication given.

## 2015-09-25 NOTE — Progress Notes (Signed)
Patient's CBG this AM was 30. Patient awake and alert and talking to staff. Given orange juice. CBG came up to 56. Given more orange juice, but patient was not willing to drink a lot. Next CBG was 69. Attempted to get patient to eat breakfast and he refused. Will try again. No signs and symptoms of hypoglycemia and patient seems to feel fine.

## 2015-09-25 NOTE — Progress Notes (Signed)
Patient has gotten very agitated and is taking his clothes off. Has attempted to pull his IV out. He has taken his heart monitor all the way off and will not put it back on. Patient's brother has been called and he is taking a shower and coming back. Patient is repeatedly pointing to his side and complaining of it hurting. Given pain medication, but he will not do anything else. CCMD notified that patient's monitor is currently off. Will attempt again when brother returns.  Chest tube has been draining serous since reconnected to suction after tPA was administered. First hour there was over 100 output and has now slowed down some. Patient is a little short of breath and has some crackles in the bases of his lungs now. Oxygen sat is good with 4L of oxygen on currently. Will continue to closely monitor.

## 2015-09-25 NOTE — Progress Notes (Signed)
Patient ID: Sean [name], male   DOB: 11-Aug-1965, 50 y.o.   MRN: [id number] [facility] [place] [facility]   PATIENT NAME: Sean [name]    MR#:  [id number]  DATE OF BIRTH:  1966-06-08  SUBJECTIVE:  Patient had replacement of the Pleurx catheter yesterday. Currently complains of cough and shortness of breath. Denies any chest pains  REVIEW OF SYSTEMS:   Review of Systems  Constitutional: No fever. Negative for chills and weight loss.  HENT: Negative for ear discharge, ear pain and nosebleeds.   Eyes: Negative for blurred vision, pain and discharge.  Respiratory: Positive for cough and shortness of breath. Negative for sputum production, wheezing and stridor.   Cardiovascular: Negative for chest pain, palpitations, orthopnea and PND.  Gastrointestinal: Negative for nausea, vomiting, abdominal pain and diarrhea.  Genitourinary: Negative for urgency and frequency.  Musculoskeletal: Negative for back pain and joint pain.  Neurological: Persistent weakness. Negative for sensory change, speech change and focal weakness.  Psychiatric/Behavioral: Negative for depression and hallucinations. The patient is not nervous/anxious.   All other systems reviewed and are negative.  Tolerating Diet:yes Tolerating PT pending  DRUG ALLERGIES:   Allergies  Allergen Reactions  . Penicillins Other (See Comments)    Reaction:  Unknown     VITALS:  Blood pressure 144/61, pulse 97, temperature 98.3 F (36.8 C), temperature source Oral, resp. rate 16, height  (1.676 m), weight 57 kg (125 lb 10.6 oz), SpO2 91 %.  PHYSICAL EXAMINATION:   Physical Exam  GENERAL:  50 y.o.-year-old patient lying in the bed with no acute distress.  EYES: Pupils equal, round, reactive to light and accommodation. No scleral icterus. Extraocular muscles intact.  HEENT: Head atraumatic, normocephalic. Oropharynx and nasopharynx clear.  NECK:  Supple, no jugular venous distention. No thyroid  enlargement, no tenderness.  LUNGS: decreased breath sounds left LL, no wheezing, rales, rhonchi. No use of accessory muscles of respiration. Left side chest tube placed CARDIOVASCULAR: S1, S2 normal. No murmurs, rubs, or gallops.  ABDOMEN: Soft, nontender, nondistended. Bowel sounds present. No organomegaly or mass.  EXTREMITIES: No cyanosis, clubbing or edema b/l.    NEUROLOGIC: Cranial nerves II through XII are intact. No focal Motor or sensory deficits b/l.   PSYCHIATRIC:  patient is alert and oriented x 3.  SKIN: No obvious rash, lesion, or ulcer.   LABORATORY PANEL:  CBC  Recent Labs Lab 09/24/15 0618  WBC 24.9*  HGB 10.8*  HCT 32.4*  PLT 340    Chemistries   Recent Labs Lab 09/21/15 1319  09/23/15 0445  NA 127*  < > 132*  K 4.9  < > 4.0  CL 90*  < > 105  CO2 18*  < > 19*  GLUCOSE 340*  < > 186*  BUN 29*  < > 24*  CREATININE 1.13  < > 0.75  CALCIUM 9.1  < > 7.4*  AST 41  --   --   ALT 34  --   --   ALKPHOS 69  --   --   BILITOT 1.1  --   --   < > = values in this interval not displayed. Cardiac Enzymes  Recent Labs Lab 09/21/15 2346  TROPONINI 0.08*   RADIOLOGY:  Dg Chest 2 View  09/24/2015  CLINICAL DATA:  Left-sided chest tube. EXAM: CHEST  2 VIEW COMPARISON:  September 22, 2015. FINDINGS: Stable cardiomediastinal silhouette. No pneumothorax is noted. Interval placement of left-sided pleural drainage catheter. Right lung is  clear. Left basilar opacity is noted concerning for pneumonia or atelectasis with associated pleural effusion which is slightly improved compared to prior exam. Bony thorax is unremarkable. IMPRESSION: Interval placement of left-sided pleural drainage catheter. Slightly improved left basilar opacity as described above. Electronically Signed   By: Lupita Raider, M.D.   On: 09/24/2015 08:21   Ct Image Guided Drainage By Percutaneous Catheter  09/24/2015  INDICATION: LEFT EMPYEMA, STATUS POST CT CHEST DRAIN yesterday, malpositioned, decreased  output EXAM: CT IMAGE GUIDED DRAINAGE BY PERCUTANEOUS CATHETER MEDICATIONS: The patient is currently admitted to the hospital and receiving intravenous antibiotics. The antibiotics were administered within an appropriate time frame prior to the initiation of the procedure. ANESTHESIA/SEDATION: Fentanyl 100 mcg IV; Versed  3 mg IV Moderate Sedation Time:  32 The patient was continuously monitored during the procedure by the interventional radiology nurse under my direct supervision. COMPLICATIONS: None immediate. PROCEDURE: Informed written consent was obtained from the the patient's brother after a thorough discussion of the procedural risks, benefits and alternatives. All questions were addressed. Maximal Sterile Barrier Technique was utilized including caps, mask, sterile gowns, sterile gloves, sterile drape, hand hygiene and skin antiseptic. A timeout was performed prior to the initiation of the procedure. Patient was positioned prone. Noncontrast localization CT performed. The previously inserted posterior left mid thoracic chest drain has retracted and is partially coiled in the soft tissues. The posterior loculated pleural fluid the catheter was originally inserted and has resolved. Therefore this catheter was removed over a guidewire. Imaging performed of the chest more inferiorly. Residual pleural fluid localized more inferiorly. Under sterile conditions and local anesthesia from a left inferior lateral approach, an 18 gauge needle was advanced into the residual pleural air and fluid more inferiorly. There was return of serosanguineous yellow fluid. Guidewire inserted followed by tract dilatation to insert a 10 [place] drain. Drain catheter position confirmed with CT. Syringe aspiration yielded blood-tinged pleural fluid. Catheter secured with a Prolene suture and connected to low wall suction through a pleura vac at 20 cm water. IMPRESSION: Uncomplicated removal of the retracted malpositioned left mid  thoracic posterior chest drain. Successful CT-guided insertion of a left inferior lateral new chest tube within the residual empyema. These results were called by telephone at the time of interpretation on 09/24/2015 at 4:13 pm to Dr. Hulda Marin , who verbally acknowledged these results. Electronically Signed   By: Judie Petit.  Shick M.D.   On: 09/24/2015 16:16   Ct Image Guided Drainage By Percutaneous Catheter  09/23/2015  INDICATION: 50 year old with left lung pneumonia and probably empyema. Request for placement of a pigtail pleural drainage catheter. EXAM: CT-GUIDED PLACEMENT OF LEFT PLEURAL CATHETER MEDICATIONS: The patient is currently admitted to the hospital and receiving intravenous antibiotics. The antibiotics were administered within an appropriate time frame prior to the initiation of the procedure. ANESTHESIA/SEDATION: 3.0 mg IV Versed 75 mcg IV Fentanyl Moderate Sedation Time:  35 minutes The patient was continuously monitored during the procedure by the interventional radiology nurse under my direct supervision. COMPLICATIONS: None immediate. TECHNIQUE: Informed written consent was obtained from the patient's brother after a thorough discussion of the procedural risks, benefits and alternatives. All questions were addressed. Maximal Sterile Barrier Technique was utilized including mask, sterile gowns, sterile gloves, sterile drape, hand hygiene and skin antiseptic. A timeout was performed prior to the initiation of the procedure. PROCEDURE: Patient was placed prone on the CT scanner. Images through the chest were obtained. The left posterior chest was prepped with chlorhexidine and  sterile field was created. Skin and soft tissues were anesthetized with 1% lidocaine. A 19 gauge Yueh catheter was directed into the pleural space while aspirating. Peach colored fluid was aspirated. A stiff Amplatz wire was advanced into the pleural space. The tract was dilated and attempted to advance a 12 [place] drain over the  wire. This drain would not advance easily and it was very uncomfortable for the patient. As a result, 10 [place] drain was advanced over the wire with some difficulty. Catheter placement was confirmed within the pleural space with CT imaging. Catheter was attached to a PleurEvac and additional peach colored cloud fluid was removed. Catheter was sutured to the skin and a dressing was placed. FINDINGS: Loculated left pleural fluid. Pleural catheter was placed in the largest posterior component. Cloudy peach colored fluid was removed. Findings are concerning for an empyema. IMPRESSION: Successful placement of a left pleural catheter with CT guidance. Cloudy fluid was removed and concerning for an empyema. Fluid was sent for culture and cytology. Electronically Signed   By: Richarda OverlieAdam  Henn M.D.   On: 09/23/2015 17:33   ASSESSMENT AND PLAN:  Rose [name] is a 50 y.o. male with a known history of Hypertension and diabetes and mental retardation. With complicated pneumonia and loculated empyema  1.Sepsis with  left LL pneumonia and pleural effusion/loculated empyema (leukocytosis, tachycardia and fever) Cultures growing cocci in chains- continue Levaquin and Flagyl IV WBC continues to be elevated. We will continue to follow repeated chest x-rays If there is no improvement patient may need intraoperative therapy   2.Lactic acidosis. Due to sepsis with left lower lobe pneumonia -supportive care -IVF  3.Elevated troponin.  Due to demand ischemia no chest pain, based on CT scan of the chest possible arthrosclerotic disease willing outpatient follow-up for that  4.Hyponatremia. Slow to improve continue normal saline  5.Diabetes. With hypoglycemia last night, I will stop his Lantus and metformin  6. Hypertension.  Continue metoprolol bid for tachycardia  Case discussed with Care Management/Social Worker. Management plans discussed with the patient, family and they are in agreement.  CODE STATUS:  full  DVT Prophylaxis: lovenox  TOTAL TIME TAKING CARE OF THIS PATIENT:3935minutes.  >50% time spent on counselling and coordination of care with pt's brother iqbal    Note: This dictation was prepared with Dragon dictation along with smaller phrase technology. Any transcriptional errors that result from this process are unintentional.  Auburn BilberryPATEL, Syanne Looney M.D on 09/25/2015 at 8:54 AM  Between 7am to 6pm - Pager - [phone]  After 6pm go to [email or web address] - password [email or web address] [email or web address]  [facility]  Office  [phone]  CC: Primary care physician; Emogene MorganAYCOCK, NGWE A, MD

## 2015-09-25 NOTE — Progress Notes (Signed)
Covering for Dr. Thelma BargeAks Empyema s/p small thoracostomy tube replaced by IR yesterday 25 cc ct NO AIRLEAK WBC still high Afebrile  PE NAD, alert Chest: decrease BS on the left base, NSR. Left tube in place Abd: soft NT Ext Well perfused  Dorsal technique 10 mg of tPA instilled in 50 cc was injected through the thoracostomy tube and I left it for 6 hours and came back and get back to the Pleur-evac and this to suction. Patient tolerated this procedure well and there were no immediate complications right after looking back to the Pleur-evac there was some significant serous fluid on the Pleur-evac  A/P empyema in a patient that is debilitated Continue antibiotic therapy TPA daily we'll repeat that tomorrow Recheck chest x-ray in the morning. Discussed with the Brothers wire power of attorney about the situation in detail and also discussed with them about tPA and they are in agreement

## 2015-09-25 NOTE — Progress Notes (Signed)
Dr. Everlene FarrierPabon came by this AM to administer TPA for chest tube. Spoke with MD and he stated okay to leave chest tube alone. Currently not hooked up to canister or suction. MD will be back in about 6 hours to check on patient. Dressing is still clean dry and intact to left back.

## 2015-09-25 NOTE — Progress Notes (Signed)
Brother has arrived and has ordered a meal tray that the patient will like. Patient's brother is making the patient eat his breakfast. Will recheck CBG once patient has eaten some food and continue to monitor.

## 2015-09-26 ENCOUNTER — Inpatient Hospital Stay: Payer: Medicaid Other

## 2015-09-26 LAB — BASIC METABOLIC PANEL
Anion gap: 6 (ref 5–15)
BUN: 18 mg/dL (ref 6–20)
CALCIUM: 7.3 mg/dL — AB (ref 8.9–10.3)
CO2: 24 mmol/L (ref 22–32)
CREATININE: 0.68 mg/dL (ref 0.61–1.24)
Chloride: 97 mmol/L — ABNORMAL LOW (ref 101–111)
GFR calc Af Amer: >60 mL/min (ref >60)
Glucose, Bld: 212 mg/dL — ABNORMAL HIGH (ref 65–99)
POTASSIUM: 4.7 mmol/L (ref 3.5–5.1)
SODIUM: 127 mmol/L — AB (ref 135–145)

## 2015-09-26 LAB — CBC
HCT: 34.6 % — ABNORMAL LOW (ref 40.0–52.0)
Hemoglobin: 11.5 g/dL — ABNORMAL LOW (ref 13.0–18.0)
MCH: 28.7 pg (ref 26.0–34.0)
MCHC: 33.3 g/dL (ref 32.0–36.0)
MCV: 86.3 fL (ref 80.0–100.0)
Platelets: 394 K/uL (ref 150–440)
RBC: 4.01 MIL/uL — ABNORMAL LOW (ref 4.40–5.90)
RDW: 13.9 % (ref 11.5–14.5)
WBC: 26.3 K/uL — ABNORMAL HIGH (ref 3.8–10.6)

## 2015-09-26 LAB — CULTURE, BLOOD (ROUTINE X 2)
CULTURE: NO GROWTH
CULTURE: NO GROWTH

## 2015-09-26 LAB — GLUCOSE, CAPILLARY
GLUCOSE-CAPILLARY: 190 mg/dL — AB (ref 65–99)
GLUCOSE-CAPILLARY: 285 mg/dL — AB (ref 65–99)
GLUCOSE-CAPILLARY: 324 mg/dL — AB (ref 65–99)
Glucose-Capillary: 166 mg/dL — ABNORMAL HIGH (ref 65–99)

## 2015-09-26 MED ORDER — INSULIN GLARGINE 100 UNIT/ML ~~LOC~~ SOLN
10.0000 [IU] | Freq: Every day | SUBCUTANEOUS | Status: DC
  Administered 2015-09-26 – 2015-09-27 (×2): 10 [IU] via SUBCUTANEOUS
  Filled 2015-09-26 (×3): qty 0.1

## 2015-09-26 MED ORDER — SODIUM CHLORIDE 0.9 % IJ SOLN
Freq: Once | INTRAMUSCULAR | Status: AC
  Administered 2015-09-26: 09:00:00 via INTRAPLEURAL
  Filled 2015-09-26: qty 10

## 2015-09-26 NOTE — Progress Notes (Signed)
Covering for Dr. Thelma BargeAks Empyema s/p small thoracostomy tube replaced by IR, TPA yesterday 900ct NO AIRLEAK WBC still high 26 Afebrile CXR unchanged, persistent lateral collection  PE NAD, alert Chest: decrease BS on the left base, NSR. Left tube in place Serosanguinous fluid on pleurovac Abd: soft NT Ext Well perfused, no edema  Under Sterile technique 10 mg of tPA instilled in 50 cc was injected through the thoracostomy tube and I left it for 6 hours and came back and re attached the CT to the Pleur-evac and place it onsuction. Patient tolerated this procedure well and there were no immediate complications  A/P empyema in a patient that is debilitated Continue antibiotic therapy, will benefit from Vanc since there were gram +. May need a second tube to drain lateral collection Recheck chest x-ray in the morning.

## 2015-09-26 NOTE — Progress Notes (Signed)
Patient ID: Sean [name], male   DOB: June 19, 1966, 50 y.o.   MRN: [id number] [facility]   PATIENT NAME: Sean [name]    MR#:  [id number]  DATE OF BIRTH:  June 19, 1966  SUBJECTIVE:   Continues to have shortness of breath and cough   REVIEW OF SYSTEMS:   Review of Systems  Constitutional: No fever. Negative for chills and weight loss.  HENT: Negative for ear discharge, ear pain and nosebleeds.   Eyes: Negative for blurred vision, pain and discharge.  Respiratory: Positive for cough and shortness of breath. Negative for sputum production, wheezing and stridor.   Cardiovascular: Negative for chest pain, palpitations, orthopnea and PND.  Gastrointestinal: Negative for nausea, vomiting, abdominal pain and diarrhea.  Genitourinary: Negative for urgency and frequency.  Musculoskeletal: Negative for back pain and joint pain.  Neurological: Persistent weakness. Negative for sensory change, speech change and focal weakness.  Psychiatric/Behavioral: Negative for depression and hallucinations. The patient is not nervous/anxious.   All other systems reviewed and are negative.  Tolerating Diet:yes Tolerating PT pending  DRUG ALLERGIES:   Allergies  Allergen Reactions  . Penicillins Other (See Comments)    Reaction:  Unknown     VITALS:  Blood pressure 152/70, pulse 110, temperature 99.4 F (37.4 C), temperature source Oral, resp. rate 18, height 5\' 6"  (1.676 m), weight 57 kg (125 lb 10.6 oz), SpO2 93 %.  PHYSICAL EXAMINATION:   Physical Exam  GENERAL:  50 y.o.-year-old patient lying in the bed with no acute distress.  EYES: Pupils equal, round, reactive to light and accommodation. No scleral icterus. Extraocular muscles intact.  HEENT: Head atraumatic, normocephalic. Oropharynx and nasopharynx clear.  NECK:  Supple, no jugular venous distention. No thyroid enlargement, no tenderness.  LUNGS: decreased breath sounds left LL, no wheezing,  rales, rhonchi. No use of accessory muscles of respiration. CARDIOVASCULAR: S1, S2 normal. No murmurs, rubs, or gallops.  ABDOMEN: Soft, nontender, nondistended. Bowel sounds present. No organomegaly or mass.  EXTREMITIES: No cyanosis, clubbing or edema b/l.    NEUROLOGIC: Cranial nerves II through XII are intact. No focal Motor or sensory deficits b/l.   PSYCHIATRIC:  patient is alert and oriented x 3.  SKIN: No obvious rash, lesion, or ulcer.   LABORATORY PANEL:  CBC  Recent Labs Lab 09/26/15 0445  WBC 26.3*  HGB 11.5*  HCT 34.6*  PLT 394    Chemistries   Recent Labs Lab 09/21/15 1319  09/26/15 0445  NA 127*  < > 127*  K 4.9  < > 4.7  CL 90*  < > 97*  CO2 18*  < > 24  GLUCOSE 340*  < > 212*  BUN 29*  < > 18  CREATININE 1.13  < > 0.68  CALCIUM 9.1  < > 7.3*  AST 41  --   --   ALT 34  --   --   ALKPHOS 69  --   --   BILITOT 1.1  --   --   < > = values in this interval not displayed. Cardiac Enzymes  Recent Labs Lab 09/21/15 2346  TROPONINI 0.08*   RADIOLOGY:  Dg Chest 1 View  09/26/2015  CLINICAL DATA:  50 year old with cavitary left lower lobe pneumonia and left parapneumonic effusion/empyema of with indwelling drainage catheter. EXAM: Portable CHEST 1 VIEW COMPARISON:  09/24/2015 and earlier. FINDINGS: Interval repositioning of the pleural drainage catheter so stenosis now at the base of the left hemithorax. Persistent loculated  left pleural effusion in the upper lateral hemithorax, axillary region, unchanged. Dense left lower lobe consolidation, unchanged. Suboptimal inspiration with atelectasis in the right lower lobe, unchanged. No new pulmonary parenchymal abnormalities. Cardiac silhouette upper normal in size for technique and degree of inspiration. IMPRESSION: 1. Stable dense left lower lobe pneumonia. 2. Stable loculated effusion in the upper lateral left hemithorax. 3. Stable right lower lobe atelectasis related to suboptimal inspiration. 4. No new  abnormalities. Electronically Signed   By: Hulan Saas M.D.   On: 09/26/2015 08:05   Ct Image Guided Drainage By Percutaneous Catheter  09/24/2015  INDICATION: LEFT EMPYEMA, STATUS POST CT CHEST DRAIN yesterday, malpositioned, decreased output EXAM: CT IMAGE GUIDED DRAINAGE BY PERCUTANEOUS CATHETER MEDICATIONS: The patient is currently admitted to the hospital and receiving intravenous antibiotics. The antibiotics were administered within an appropriate time frame prior to the initiation of the procedure. ANESTHESIA/SEDATION: Fentanyl 100 mcg IV; Versed  3 mg IV Moderate Sedation Time:  32 The patient was continuously monitored during the procedure by the interventional radiology nurse under my direct supervision. COMPLICATIONS: None immediate. PROCEDURE: Informed written consent was obtained from the the patient's brother after a thorough discussion of the procedural risks, benefits and alternatives. All questions were addressed. Maximal Sterile Barrier Technique was utilized including caps, mask, sterile gowns, sterile gloves, sterile drape, hand hygiene and skin antiseptic. A timeout was performed prior to the initiation of the procedure. Patient was positioned prone. Noncontrast localization CT performed. The previously inserted posterior left mid thoracic chest drain has retracted and is partially coiled in the soft tissues. The posterior loculated pleural fluid the catheter was originally inserted and has resolved. Therefore this catheter was removed over a guidewire. Imaging performed of the chest more inferiorly. Residual pleural fluid localized more inferiorly. Under sterile conditions and local anesthesia from a left inferior lateral approach, an 18 gauge needle was advanced into the residual pleural air and fluid more inferiorly. There was return of serosanguineous yellow fluid. Guidewire inserted followed by tract dilatation to insert a 10 [place] drain. Drain catheter position confirmed with CT.  Syringe aspiration yielded blood-tinged pleural fluid. Catheter secured with a Prolene suture and connected to low wall suction through a pleura vac at 20 cm water. IMPRESSION: Uncomplicated removal of the retracted malpositioned left mid thoracic posterior chest drain. Successful CT-guided insertion of a left inferior lateral new chest tube within the residual empyema. These results were called by telephone at the time of interpretation on 09/24/2015 at 4:13 pm to Dr. Hulda Marin , who verbally acknowledged these results. Electronically Signed   By: Judie Petit.  Shick M.D.   On: 09/24/2015 16:16   ASSESSMENT AND PLAN:  Sean [name] is a 50 y.o. male with a known history of Hypertension and diabetes and mental retardation. With complicated pneumonia and loculated empyema  1.Sepsis with  left LL pneumonia and pleural effusion/loculated empyema (leukocytosis, tachycardia and fever), no significant improvement in patient's clinical condition Cultures growing cocci in chains- continue Levaquin and Flagyl IV WBC continues to be elevated. I will ask infectious disease to out with the patient tomorrow If there is no improvement patient may need intraoperative therapy with decortication   2.Lactic acidosis. Due to sepsis with left lower lobe pneumonia -supportive care -IVF  3.Elevated troponin.  Due to demand ischemia no chest pain, based on CT scan of the chest possible arthrosclerotic disease patient will need outpatient cardiology evaluation  4.Hyponatremia. 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 Blood sugars now elevated I will give him one third dose of his normal Lantus  Follow blood sugars closely 6. Hypertension.  Continue metoprolol bid for tachycardia  Case discussed with Care Management/Social Worker. Management plans discussed with the patient, family and they are in  agreement.  CODE STATUS: full  DVT Prophylaxis: lovenox  TOTAL TIME TAKING CARE OF THIS PATIENT:80minutes.      Note: This dictation was prepared with Dragon dictation along with smaller phrase technology. Any transcriptional errors that result from this process are unintentional.  Auburn Bilberry M.D on 09/26/2015 at 9:12 AM  Between 7am to 6pm - Pager - [phone]  After 6pm go to [email or web address] - password [email or web address] [facility]  [facility]  Office  [phone]  CC: Primary care physician; Emogene Morgan, MD

## 2015-09-27 ENCOUNTER — Inpatient Hospital Stay: Payer: Medicaid Other

## 2015-09-27 LAB — BASIC METABOLIC PANEL
Anion gap: 4 — ABNORMAL LOW (ref 5–15)
BUN: 22 mg/dL — ABNORMAL HIGH (ref 6–20)
CHLORIDE: 99 mmol/L — AB (ref 101–111)
CO2: 26 mmol/L (ref 22–32)
CREATININE: 0.7 mg/dL (ref 0.61–1.24)
Calcium: 7.3 mg/dL — ABNORMAL LOW (ref 8.9–10.3)
GFR calc non Af Amer: >60 mL/min (ref >60)
Glucose, Bld: 252 mg/dL — ABNORMAL HIGH (ref 65–99)
POTASSIUM: 4.5 mmol/L (ref 3.5–5.1)
SODIUM: 129 mmol/L — AB (ref 135–145)

## 2015-09-27 LAB — CBC
HEMATOCRIT: 32.6 % — AB (ref 40.0–52.0)
HEMOGLOBIN: 11 g/dL — AB (ref 13.0–18.0)
MCH: 29.6 pg (ref 26.0–34.0)
MCHC: 33.8 g/dL (ref 32.0–36.0)
MCV: 87.5 fL (ref 80.0–100.0)
Platelets: 457 10*3/uL — ABNORMAL HIGH (ref 150–440)
RBC: 3.73 MIL/uL — AB (ref 4.40–5.90)
RDW: 13.8 % (ref 11.5–14.5)
WBC: 25.5 10*3/uL — ABNORMAL HIGH (ref 3.8–10.6)

## 2015-09-27 LAB — GLUCOSE, CAPILLARY
GLUCOSE-CAPILLARY: 219 mg/dL — AB (ref 65–99)
GLUCOSE-CAPILLARY: 330 mg/dL — AB (ref 65–99)
GLUCOSE-CAPILLARY: 349 mg/dL — AB (ref 65–99)
Glucose-Capillary: 280 mg/dL — ABNORMAL HIGH (ref 65–99)
Glucose-Capillary: 188 mg/dL — ABNORMAL HIGH (ref 65–99)

## 2015-09-27 LAB — ABO/RH: ABO/RH(D): AB POS

## 2015-09-27 LAB — CYTOLOGY - NON PAP

## 2015-09-27 LAB — PROTIME-INR
INR: 1.08
Prothrombin Time: 14.2 seconds (ref 11.4–15.0)

## 2015-09-27 LAB — PH, BODY FLUID: pH, Body Fluid: 6.8

## 2015-09-27 LAB — APTT: aPTT: 35 seconds (ref 24–36)

## 2015-09-27 NOTE — Consult Note (Signed)
Pharmacy Antibiotic Note  Sean [name] is a 50 y.o. male admitted on 09/21/2015 with pneumonia and pleural effusion.  Pharmacy has been consulted for levofloxacin  Plan: Will continue levofloxacin 750mg  IV Q24H. Patient also on metronidazole 500mg  IV Q8H. ID following  Height: 5\' 6"  (167.6 cm) Weight: 125 lb 10.6 oz (57 kg) IBW/kg (Calculated) : 63.8  Temp (24hrs), Avg:98.4 F (36.9 C), Min:97.9 F (36.6 C), Max:99.4 F (37.4 C)   Recent Labs Lab 09/21/15 1319 09/21/15 1534 09/21/15 1813 09/21/15 2346 09/22/15 0400 09/23/15 0445 09/23/15 1038 09/24/15 0618 09/26/15 0445 09/27/15 0456  WBC 14.9*  --   --   --  20.5* 26.8*  --  24.9* 26.3* 25.5*  CREATININE 1.13  --   --   --  0.82 0.75  --   --  0.68 0.70  LATICACIDVEN  --  3.3* 2.6* 1.6  --   --   --   --   --   --   VANCOTROUGH  --   --   --   --   --   --  <4*  --   --   --     Estimated Creatinine Clearance: 90.1 mL/min (by C-G formula based on Cr of 0.7).    Allergies  Allergen Reactions  . Penicillins Other (See Comments)    Reaction:  Unknown     Antimicrobials this admission: vancomycin 4/4 >>4/4  levofloxacin 4/4 >>  Metronidazole 4/5   Microbiology results: 4/4 Blood x 2 - NGTD 4/4 UCx: NG, final 4/6 Pleural fluid  MRSA PCR negative   Thank you for allowing pharmacy to be a part of this patient's care.  Demetrius Charityeldrin D. Royden Bulman, PharmD    09/27/2015 11:52 AM

## 2015-09-27 NOTE — Progress Notes (Signed)
Inpatient Diabetes Program Recommendations  AACE/ADA: New Consensus Statement on Inpatient Glycemic Control (2015)  Target Ranges:  Prepandial:   less than 140 mg/dL      Peak postprandial:   less than 180 mg/dL (1-2 hours)      Critically ill patients:  140 - 180 mg/dL  Results for Tipps, Trini (MRN [id number]) as of 09/27/2015 09:15  Ref. Range 09/26/2015 07:23 09/26/2015 11:04 09/26/2015 16:26 09/26/2015 21:07 09/27/2015 07:41  Glucose-Capillary Latest Ref Range: 65-99 mg/dL 409190 (H) 811166 (H) 914285 (H) 324 (H) 219 (H)   Review of Glycemic Control   Current orders for Inpatient glycemic control: Lantus 10 units QHS, Novolog 0-15 units TID with meals, Novolog 0-5 units QHS  Inpatient Diabetes Program Recommendations: Insulin - Basal: Please consider increasing Lantus to 15 units QHS. Insulin - Meal Coverage: Please consider ordering Novolog 4 units TID with meals for meal coverage if patient eats at least 50% of meals. HgbA1C: Please consider ordering an A1C to evaluate glycemic control over the past 2-3 months.  Thanks, Orlando PennerMarie Necha Harries, RN, MSN, CDE Diabetes Coordinator Inpatient Diabetes Program [phone] (Team Pager from 8am to 5pm) [phone] (AP office) [phone] [facility] office) [phone] [facility] office)

## 2015-09-27 NOTE — Anesthesia Preprocedure Evaluation (Addendum)
Anesthesia Evaluation  Patient identified by MRN, date of birth, ID band Patient awake    Reviewed: Allergy & Precautions, NPO status , Patient's Chart, lab work & pertinent test results  Airway Mallampati: II  TM Distance: >3 FB Neck ROM: Full    Dental no notable dental hx. (+) Edentulous Upper, Edentulous Lower   Pulmonary neg pulmonary ROS, pneumonia, unresolved,    Pulmonary exam normal breath sounds clear to auscultation       Cardiovascular hypertension, negative cardio ROS Normal cardiovascular exam Rhythm:Regular Rate:Normal     Neuro/Psych PSYCHIATRIC DISORDERS negative neurological ROS  negative psych ROS   GI/Hepatic negative GI ROS, Neg liver ROS,   Endo/Other  negative endocrine ROSdiabetes, Well Controlled, Type 2  Renal/GU negative Renal ROS  negative genitourinary   Musculoskeletal negative musculoskeletal ROS (+)   Abdominal   Peds negative pediatric ROS (+)  Hematology negative hematology ROS (+)   Anesthesia Other Findings Empyema with chest tube, left  Reproductive/Obstetrics negative OB ROS                           Anesthesia Physical Anesthesia Plan  ASA: III  Anesthesia Plan: General   Post-op Pain Management: GA combined w/ Regional for post-op pain   Induction: Intravenous  Airway Management Planned: Oral ETT and Double Lumen EBT  Additional Equipment:   Intra-op Plan:   Post-operative Plan:   Informed Consent: I have reviewed the patients History and Physical, chart, labs and discussed the procedure including the risks, benefits and alternatives for the proposed anesthesia with the patient or authorized representative who has indicated his/her understanding and acceptance.     Plan Discussed with:   Anesthesia Plan Comments:         Anesthesia Quick Evaluation

## 2015-09-27 NOTE — Progress Notes (Signed)
Plans for decortication tomorrow noted. There is little further for PCCM to add. Please call if we can be of further assistance  Billy Fischeravid Simonds, MD PCCM service Mobile [phone] Pager [phone] 09/27/2015

## 2015-09-27 NOTE — Progress Notes (Signed)
He continues to have elevated WBC though Tmax is less than 100.  Via his brother, patient states he still has some pain and shortness of breath  His lungs are equal anteriorly  Heart regular CTsite is dry.  No erythema.  I have reviewed today's CXRay with the brother and discussed the options of thoracotomy and decortication.  There remains some undrained fluid and with his WBC persistently elevated, I think he would benefit from decortication.  Will hold Lovenox.  Will check labs.  Discussed surgery with the brother and reviewed options.  Risks discussed as well.  He is aware that the patient may be discharged with chest tubes in place.  Will plan for surgery tomorrow.  [employer] Sean [name].

## 2015-09-27 NOTE — Progress Notes (Signed)
Patient ID: Sean [name], male   DOB: 02-04-1966, 50 y.o.   MRN: [id number] [facility] [place] [facility]   PATIENT NAME: Sean [name]    MR#:  [id number]  DATE OF BIRTH:  02-04-1966  SUBJECTIVE:   Persistent cough and shortness of breath no significant improvement in condition, WBC continues to be elevated   REVIEW OF SYSTEMS:   Review of Systems  Constitutional: No fever. Negative for chills and weight loss.  HENT: Negative for ear discharge, ear pain and nosebleeds.   Eyes: Negative for blurred vision, pain and discharge.  Respiratory: Positive for cough and shortness of breath. Negative for sputum production, wheezing and stridor.   Cardiovascular: Negative for chest pain, palpitations, orthopnea and PND.  Gastrointestinal: Negative for nausea, vomiting, abdominal pain and diarrhea.  Genitourinary: Negative for urgency and frequency.  Musculoskeletal: Negative for back pain and joint pain.  Neurological: Persistent weakness. Negative for sensory change, speech change and focal weakness.  Psychiatric/Behavioral: Negative for depression and hallucinations. The patient is not nervous/anxious.   All other systems reviewed and are negative.  Tolerating Diet:yes Tolerating PT pending  DRUG ALLERGIES:   Allergies  Allergen Reactions  . Penicillins Other (See Comments)    Reaction:  Unknown     VITALS:  Blood pressure 125/60, pulse 90, temperature 98 F (36.7 C), temperature source Oral, resp. rate 22, height 5\' 6"  (1.676 m), weight 57 kg (125 lb 10.6 oz), SpO2 95 %.  PHYSICAL EXAMINATION:   Physical Exam  GENERAL:  50 y.o.-year-old patient lying in the bed with no acute distress.  EYES: Pupils equal, round, reactive to light and accommodation. No scleral icterus. Extraocular muscles intact.  HEENT: Head atraumatic, normocephalic. Oropharynx and nasopharynx clear.  NECK:  Supple, no jugular venous distention. No thyroid enlargement, no  tenderness.  LUNGS: decreased breath sounds left LL, no wheezing, no rales, no rhonchi. No use of accessory muscles of respiration. CARDIOVASCULAR: S1, S2 normal. No murmurs, rubs, or gallops.  ABDOMEN: Soft, nontender, nondistended. Bowel sounds present. No organomegaly or mass.  EXTREMITIES: No cyanosis, clubbing or edema b/l.    NEUROLOGIC: Cranial nerves II through XII are intact. No focal Motor or sensory deficits b/l.   PSYCHIATRIC:  patient is alert and oriented x 3.  SKIN: No obvious rash, lesion, or ulcer.   LABORATORY PANEL:  CBC  Recent Labs Lab 09/27/15 0456  WBC 25.5*  HGB 11.0*  HCT 32.6*  PLT 457*    Chemistries   Recent Labs Lab 09/21/15 1319  09/27/15 0456  NA 127*  < > 129*  K 4.9  < > 4.5  CL 90*  < > 99*  CO2 18*  < > 26  GLUCOSE 340*  < > 252*  BUN 29*  < > 22*  CREATININE 1.13  < > 0.70  CALCIUM 9.1  < > 7.3*  AST 41  --   --   ALT 34  --   --   ALKPHOS 69  --   --   BILITOT 1.1  --   --   < > = values in this interval not displayed. Cardiac Enzymes  Recent Labs Lab 09/21/15 2346  TROPONINI 0.08*   RADIOLOGY:  Dg Chest 1 View  09/27/2015  CLINICAL DATA:  Pleural effusion. EXAM: CHEST 1 VIEW COMPARISON:  09/26/2015. FINDINGS: Left chest drainage catheter in stable position. Mediastinum and hilar structures are normal. Persistent left lower lobe infiltrate left pleural effusion again noted. Persistent pleural-based density  noted over the left upper chest most likely loculated pleural effusion. No interim change. Low lung volumes. No pneumothorax. IMPRESSION: 1. Left chest drainage catheter in stable position. Persistent small left pleural effusion. Persistent pleural-based density left upper chest consistent with small loculated effusion. 2.  Persistent left lower lobe infiltrate. Electronically Signed   By: Maisie Fus  Register   On: 09/27/2015 07:25   Dg Chest 1 View  09/26/2015  CLINICAL DATA:  50 year old with cavitary left lower lobe pneumonia  and left parapneumonic effusion/empyema of with indwelling drainage catheter. EXAM: Portable CHEST 1 VIEW COMPARISON:  09/24/2015 and earlier. FINDINGS: Interval repositioning of the pleural drainage catheter so stenosis now at the base of the left hemithorax. Persistent loculated left pleural effusion in the upper lateral hemithorax, axillary region, unchanged. Dense left lower lobe consolidation, unchanged. Suboptimal inspiration with atelectasis in the right lower lobe, unchanged. No new pulmonary parenchymal abnormalities. Cardiac silhouette upper normal in size for technique and degree of inspiration. IMPRESSION: 1. Stable dense left lower lobe pneumonia. 2. Stable loculated effusion in the upper lateral left hemithorax. 3. Stable right lower lobe atelectasis related to suboptimal inspiration. 4. No new abnormalities. Electronically Signed   By: Hulan Saas M.D.   On: 09/26/2015 08:05   ASSESSMENT AND PLAN:  Sean [name] is a 50 y.o. male with a known history of Hypertension and diabetes and mental retardation. With complicated pneumonia and loculated empyema  1.Sepsis with  left LL pneumonia and pleural effusion/loculated empyema ,  no significant improvement in patient's clinical condition I have discussed with Dr. Thelma Barge. We'll be planning to take the patient to the operating room for decortication tomorrow Cultures growing cocci in chains- continue Levaquin and Flagyl IV WBC continues to be elevated.   2.Lactic acidosis. Due to sepsis with left lower lobe pneumonia -supportive care -IVF  3.Elevated troponin.  Due to demand ischemia no chest pain, based on CT scan of the chest possible arthrosclerotic disease patient will need outpatient cardiology evaluation  4.Hyponatremia. 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 Blood sugars now elevated I will give him one third  dose of his normal Lantus  Follow blood sugars closely  6. Hypertension.  Continue metoprolol bid for tachycardia  Case discussed with Care Management/Social Worker. Management plans discussed with the patient, family and they are in agreement.  CODE STATUS: full  DVT Prophylaxis: lovenox  TOTAL TIME TAKING CARE OF THIS PATIENT:33minutes.      Note: This dictation was prepared with Dragon dictation along with smaller phrase technology. Any transcriptional errors that result from this process are unintentional.  Auburn Bilberry M.D on 09/27/2015 at 10:00 AM  Between 7am to 6pm - Pager - [phone]  After 6pm go to [email or web address] - password [email or web address] [facility]  [facility] [facility]  Office  [phone]  CC: Primary care physician; Emogene Morgan, MD

## 2015-09-27 NOTE — Care Management (Signed)
Mentally ill patient that lives at home with his brother. Plan is thoracotomy and decortication. Patient may be discharged home with CT. Will follow up with his brother regarding a discharge plan.

## 2015-09-28 ENCOUNTER — Inpatient Hospital Stay: Payer: Medicaid Other | Admitting: Anesthesiology

## 2015-09-28 ENCOUNTER — Encounter
Admission: EM | Disposition: A | Discharge status: Home Health | Payer: Self-pay | Source: Home / Self Care | Attending: Internal Medicine

## 2015-09-28 ENCOUNTER — Inpatient Hospital Stay: Payer: Medicaid Other

## 2015-09-28 ENCOUNTER — Encounter: Payer: Self-pay | Admitting: *Deleted

## 2015-09-28 DIAGNOSIS — J90 Pleural effusion, not elsewhere classified: Secondary | ICD-10-CM | POA: Diagnosis not present

## 2015-09-28 HISTORY — PX: VIDEO BRONCHOSCOPY: SHX5072

## 2015-09-28 HISTORY — PX: THORACOTOMY: SHX5074

## 2015-09-28 LAB — GLUCOSE, CAPILLARY
GLUCOSE-CAPILLARY: 145 mg/dL — AB (ref 65–99)
GLUCOSE-CAPILLARY: 156 mg/dL — AB (ref 65–99)
GLUCOSE-CAPILLARY: 205 mg/dL — AB (ref 65–99)
Glucose-Capillary: 152 mg/dL — ABNORMAL HIGH (ref 65–99)

## 2015-09-28 LAB — MRSA PCR SCREENING: MRSA by PCR: NEGATIVE

## 2015-09-28 LAB — BLOOD GAS, ARTERIAL
Acid-base deficit: 6 mmol/L — ABNORMAL HIGH (ref 0.0–2.0)
BICARBONATE: 17.3 meq/L — AB (ref 21.0–28.0)
FIO2: 0.28
O2 SAT: 90.5 %
PATIENT TEMPERATURE: 37
PO2 ART: 58 mmHg — AB (ref 83.0–108.0)
pCO2 arterial: 26 mmHg — ABNORMAL LOW (ref 32.0–48.0)
pH, Arterial: 7.43 (ref 7.350–7.450)

## 2015-09-28 LAB — BASIC METABOLIC PANEL
ANION GAP: 7 (ref 5–15)
BUN: 22 mg/dL — AB (ref 6–20)
CALCIUM: 6.7 mg/dL — AB (ref 8.9–10.3)
CO2: 20 mmol/L — ABNORMAL LOW (ref 22–32)
Chloride: 103 mmol/L (ref 101–111)
Creatinine, Ser: 0.65 mg/dL (ref 0.61–1.24)
GFR calc Af Amer: >60 mL/min (ref >60)
GLUCOSE: 201 mg/dL — AB (ref 65–99)
Potassium: 4.8 mmol/L (ref 3.5–5.1)
SODIUM: 130 mmol/L — AB (ref 135–145)

## 2015-09-28 LAB — CBC
HCT: 29.6 % — ABNORMAL LOW (ref 40.0–52.0)
Hemoglobin: 9.8 g/dL — ABNORMAL LOW (ref 13.0–18.0)
MCH: 29 pg (ref 26.0–34.0)
MCHC: 33.2 g/dL (ref 32.0–36.0)
MCV: 87.4 fL (ref 80.0–100.0)
Platelets: 519 10*3/uL — ABNORMAL HIGH (ref 150–440)
RBC: 3.39 MIL/uL — ABNORMAL LOW (ref 4.40–5.90)
RDW: 13.7 % (ref 11.5–14.5)
WBC: 23.8 10*3/uL — AB (ref 3.8–10.6)

## 2015-09-28 SURGERY — BRONCHOSCOPY, VIDEO-ASSISTED
Anesthesia: General | Wound class: Clean Contaminated

## 2015-09-28 MED ORDER — ALBUMIN HUMAN 25 % IV SOLN
12.5000 g | Freq: Once | INTRAVENOUS | Status: DC
  Filled 2015-09-28 (×2): qty 50

## 2015-09-28 MED ORDER — BUPIVACAINE HCL (PF) 0.25 % IJ SOLN
INTRAMUSCULAR | Status: AC
  Filled 2015-09-28: qty 30

## 2015-09-28 MED ORDER — SODIUM CHLORIDE 0.9 % IV SOLN
INTRAVENOUS | Status: DC | PRN
  Administered 2015-09-28: 30 mL

## 2015-09-28 MED ORDER — KETOROLAC TROMETHAMINE 30 MG/ML IJ SOLN: 30.0000 mg | Freq: Once | INTRAMUSCULAR | Status: DC | PRN

## 2015-09-28 MED ORDER — PHENYLEPHRINE 40 MCG/ML (10ML) SYRINGE FOR IV PUSH (FOR BLOOD PRESSURE SUPPORT)
80.0000 ug | PREFILLED_SYRINGE | INTRAVENOUS | Status: DC | PRN
  Filled 2015-09-28: qty 2

## 2015-09-28 MED ORDER — SODIUM CHLORIDE 0.9 % IV SOLN
10000.0000 ug | INTRAVENOUS | Status: DC | PRN
  Administered 2015-09-28: 20 ug/min via INTRAVENOUS

## 2015-09-28 MED ORDER — EPHEDRINE 5 MG/ML INJ
10.0000 mg | INTRAVENOUS | Status: DC | PRN
  Filled 2015-09-28: qty 2

## 2015-09-28 MED ORDER — SODIUM CHLORIDE 0.9 % IV SOLN
INTRAVENOUS | Status: DC
  Administered 2015-09-28: 100 mL/h via INTRAVENOUS
  Administered 2015-09-28 – 2015-10-01 (×5): via INTRAVENOUS

## 2015-09-28 MED ORDER — ALBUMIN HUMAN 25 % IV SOLN
12.5000 g | Freq: Once | INTRAVENOUS | Status: DC
  Filled 2015-09-28: qty 50

## 2015-09-28 MED ORDER — METOCLOPRAMIDE HCL 5 MG/ML IJ SOLN: 10.0000 mg | Freq: Once | INTRAMUSCULAR | Status: DC | PRN

## 2015-09-28 MED ORDER — PHENYLEPHRINE HCL 10 MG/ML IJ SOLN
INTRAMUSCULAR | Status: DC | PRN
  Administered 2015-09-28: 200 ug via INTRAVENOUS
  Administered 2015-09-28: 100 ug via INTRAVENOUS
  Administered 2015-09-28: 200 ug via INTRAVENOUS

## 2015-09-28 MED ORDER — BISACODYL 5 MG PO TBEC
10.0000 mg | DELAYED_RELEASE_TABLET | Freq: Every day | ORAL | Status: DC
  Administered 2015-09-28 – 2015-10-08 (×9): 10 mg via ORAL
  Filled 2015-09-28 (×9): qty 2

## 2015-09-28 MED ORDER — EPHEDRINE SULFATE 50 MG/ML IJ SOLN: INTRAMUSCULAR | Status: DC | PRN

## 2015-09-28 MED ORDER — LACTATED RINGERS IV SOLN
500.0000 mL | Freq: Once | INTRAVENOUS | Status: DC
Start: 2015-09-28 — End: 2015-09-28

## 2015-09-28 MED ORDER — ALBUMIN HUMAN 25 % IV SOLN
INTRAVENOUS | Status: DC | PRN
  Administered 2015-09-28: 17:00:00 via INTRAVENOUS

## 2015-09-28 MED ORDER — FENTANYL 2.5 MCG/ML W/ROPIVACAINE 0.2% IN NS 100 ML EPIDURAL INFUSION (ARMC-ANES)
7.0000 mL/h | EPIDURAL | Status: DC
  Administered 2015-09-28: 5 mL/h via EPIDURAL
  Administered 2015-09-29 – 2015-10-01 (×5): 7 mL/h via EPIDURAL
  Filled 2015-09-28 (×7): qty 100

## 2015-09-28 MED ORDER — BUPIVACAINE HCL (PF) 0.25 % IJ SOLN
INTRAMUSCULAR | Status: DC | PRN
  Administered 2015-09-28: 4 mL via EPIDURAL

## 2015-09-28 MED ORDER — FENTANYL CITRATE (PF) 100 MCG/2ML IJ SOLN: 25.0000 ug | INTRAMUSCULAR | Status: DC | PRN

## 2015-09-28 MED ORDER — ROCURONIUM BROMIDE 100 MG/10ML IV SOLN
INTRAVENOUS | Status: DC | PRN
  Administered 2015-09-28: 30 mg via INTRAVENOUS
  Administered 2015-09-28: 5 mg via INTRAVENOUS
  Administered 2015-09-28 (×2): 10 mg via INTRAVENOUS
  Administered 2015-09-28: 20 mg via INTRAVENOUS
  Administered 2015-09-28: 5 mg via INTRAVENOUS

## 2015-09-28 MED ORDER — FENTANYL CITRATE (PF) 100 MCG/2ML IJ SOLN
INTRAMUSCULAR | Status: DC | PRN
  Administered 2015-09-28: 50 ug via INTRAVENOUS
  Administered 2015-09-28: 100 ug via INTRAVENOUS
  Administered 2015-09-28 (×5): 25 ug via INTRAVENOUS

## 2015-09-28 MED ORDER — DIPHENHYDRAMINE HCL 50 MG/ML IJ SOLN: 12.5000 mg | INTRAMUSCULAR | Status: DC | PRN

## 2015-09-28 MED ORDER — ONDANSETRON HCL 4 MG/2ML IJ SOLN
INTRAMUSCULAR | Status: DC | PRN
  Administered 2015-09-28: 4 mg via INTRAVENOUS

## 2015-09-28 MED ORDER — GLYCOPYRROLATE 0.2 MG/ML IJ SOLN
INTRAMUSCULAR | Status: DC | PRN
  Administered 2015-09-28: 0.6 mg via INTRAVENOUS

## 2015-09-28 MED ORDER — DEXTROSE-NACL 5-0.45 % IV SOLN
INTRAVENOUS | Status: DC
  Administered 2015-09-28 – 2015-09-30 (×4): via INTRAVENOUS

## 2015-09-28 MED ORDER — LIDOCAINE HCL (CARDIAC) 20 MG/ML IV SOLN
INTRAVENOUS | Status: DC | PRN
  Administered 2015-09-28: 100 mg via INTRAVENOUS
  Administered 2015-09-28: 20 mg via INTRAVENOUS

## 2015-09-28 MED ORDER — PROPOFOL 10 MG/ML IV BOLUS
INTRAVENOUS | Status: DC | PRN
  Administered 2015-09-28: 90 mg via INTRAVENOUS

## 2015-09-28 MED ORDER — ONDANSETRON HCL 4 MG/2ML IJ SOLN
4.0000 mg | Freq: Four times a day (QID) | INTRAMUSCULAR | Status: DC | PRN
  Administered 2015-10-07: 4 mg via INTRAVENOUS

## 2015-09-28 MED ORDER — MIDAZOLAM HCL 2 MG/2ML IJ SOLN: 0.5000 mg | Freq: Once | INTRAMUSCULAR | Status: DC | PRN

## 2015-09-28 MED ORDER — SODIUM CHLORIDE 0.9 % IJ SOLN
INTRAMUSCULAR | Status: AC
  Filled 2015-09-28: qty 50

## 2015-09-28 MED ORDER — SODIUM CHLORIDE 0.9 % IV SOLN
INTRAVENOUS | Status: DC | PRN
  Administered 2015-09-28 (×2): via INTRAVENOUS

## 2015-09-28 MED ORDER — ALBUTEROL SULFATE (2.5 MG/3ML) 0.083% IN NEBU
2.5000 mg | INHALATION_SOLUTION | RESPIRATORY_TRACT | Status: DC
  Administered 2015-09-28 – 2015-09-29 (×3): 2.5 mg via RESPIRATORY_TRACT
  Filled 2015-09-28 (×3): qty 3

## 2015-09-28 MED ORDER — ALBUTEROL SULFATE (2.5 MG/3ML) 0.083% IN NEBU: 2.5000 mg | INHALATION_SOLUTION | RESPIRATORY_TRACT | Status: DC

## 2015-09-28 MED ORDER — MEPERIDINE HCL 25 MG/ML IJ SOLN: 6.2500 mg | INTRAMUSCULAR | Status: DC | PRN

## 2015-09-28 MED ORDER — BUPIVACAINE LIPOSOME 1.3 % IJ SUSP
INTRAMUSCULAR | Status: AC
  Filled 2015-09-28: qty 20

## 2015-09-28 MED ORDER — NEOSTIGMINE METHYLSULFATE 10 MG/10ML IV SOLN
INTRAVENOUS | Status: DC | PRN
  Administered 2015-09-28: 4 mg via INTRAVENOUS

## 2015-09-28 MED ORDER — SODIUM CHLORIDE FLUSH 0.9 % IV SOLN
INTRAVENOUS | Status: AC
  Filled 2015-09-28: qty 10

## 2015-09-28 SURGICAL SUPPLY — 67 items
BENZOIN TINCTURE PRP APPL 2/3 (GAUZE/BANDAGES/DRESSINGS) ×3 IMPLANT
BNDG COHESIVE 4X5 TAN STRL (GAUZE/BANDAGES/DRESSINGS) IMPLANT
BRONCHOSCOPE PED SLIM DISP (MISCELLANEOUS) ×3 IMPLANT
BULB RESERV EVAC DRAIN JP 100C (MISCELLANEOUS) ×3 IMPLANT
CANISTER SUCT 1200ML W/VALVE (MISCELLANEOUS) ×3 IMPLANT
CATH THOR STR 28F  SOFT WA (CATHETERS) ×1
CATH THOR STR 28F SOFT WA (CATHETERS) ×2 IMPLANT
CATH TRAY 16F METER LATEX (MISCELLANEOUS) ×3 IMPLANT
CATH URET ROBINSON 16FR STRL (CATHETERS) ×3 IMPLANT
CHLORAPREP W/TINT 26ML (MISCELLANEOUS) ×3 IMPLANT
CNTNR SPEC 2.5X3XGRAD LEK (MISCELLANEOUS) ×6
CONN REDUCER 1/4X3/8 STR (CONNECTOR) ×3
CONN REDUCER 3/8X3/8X3/8Y (CONNECTOR) ×3
CONNECTOR REDUCER 1/4X3/8 STR (CONNECTOR) ×2 IMPLANT
CONNECTOR REDUCER 3/8X3/8X3/8Y (CONNECTOR) ×2 IMPLANT
CONT SPEC 4OZ STER OR WHT (MISCELLANEOUS) ×3
CONTAINER SPEC 2.5X3XGRAD LEK (MISCELLANEOUS) ×6 IMPLANT
CUTTER ECHEON FLEX ENDO 45 340 (ENDOMECHANICALS) IMPLANT
DEFOGGER SCOPE WARMER CLEARIFY (MISCELLANEOUS) ×3 IMPLANT
DRAIN CHANNEL 28F RND 3/8 FF (WOUND CARE) ×3 IMPLANT
DRAIN CHANNEL 32F RND 10.7 FF (WOUND CARE) ×6 IMPLANT
DRAIN CHANNEL JP 19F (MISCELLANEOUS) ×3 IMPLANT
DRAIN CHEST DRY SUCT SGL (MISCELLANEOUS) ×3 IMPLANT
DRAPE C-SECTION (MISCELLANEOUS) ×3 IMPLANT
DRAPE MAG INST 16X20 L/F (DRAPES) ×3 IMPLANT
DRSG OPSITE POSTOP 4X6 (GAUZE/BANDAGES/DRESSINGS) IMPLANT
DRSG OPSITE POSTOP 4X8 (GAUZE/BANDAGES/DRESSINGS) IMPLANT
DRSG TELFA 3X8 NADH (GAUZE/BANDAGES/DRESSINGS) ×3 IMPLANT
ELECT BLADE 6.5 EXT (BLADE) ×3 IMPLANT
ELECT CAUTERY BLADE TIP 2.5 (TIP) ×3
ELECT REM PT RETURN 9FT ADLT (ELECTROSURGICAL) ×3
ELECTRODE CAUTERY BLDE TIP 2.5 (TIP) ×2 IMPLANT
ELECTRODE REM PT RTRN 9FT ADLT (ELECTROSURGICAL) ×2 IMPLANT
GAUZE SPONGE 4X4 12PLY STRL (GAUZE/BANDAGES/DRESSINGS) IMPLANT
GLOVE EXAM LX STRL 7.5 (GLOVE) ×3 IMPLANT
GOWN STRL REUS W/ TWL LRG LVL3 (GOWN DISPOSABLE) ×8 IMPLANT
GOWN STRL REUS W/TWL LRG LVL3 (GOWN DISPOSABLE) ×4
KIT ARTERIAL LINE 20GX12CM (SET/KITS/TRAYS/PACK) ×3 IMPLANT
KIT RM TURNOVER STRD PROC AR (KITS) ×3 IMPLANT
LABEL OR SOLS (LABEL) ×3 IMPLANT
LOOP RED MAXI  1X406MM (MISCELLANEOUS) ×1
LOOP VESSEL MAXI 1X406 RED (MISCELLANEOUS) ×2 IMPLANT
MARKER SKIN DUAL TIP RULER LAB (MISCELLANEOUS) IMPLANT
PACK BASIN MAJOR ARMC (MISCELLANEOUS) ×3 IMPLANT
REDUCER CONN 3/8X1/4X1/4IN (MISCELLANEOUS) ×3 IMPLANT
SPONGE KITTNER 5P (MISCELLANEOUS) ×6 IMPLANT
STAPLER SKIN PROX 35W (STAPLE) IMPLANT
STRIP CLOSURE SKIN 1/2X4 (GAUZE/BANDAGES/DRESSINGS) ×3 IMPLANT
SUT CHROMIC 3 0 SH 27 (SUTURE) ×3 IMPLANT
SUT CHROMIC 4 0 RB 1X27 (SUTURE) ×3 IMPLANT
SUT MNCRL AB 3-0 PS2 27 (SUTURE) ×3 IMPLANT
SUT SILK 0 (SUTURE) ×1
SUT SILK 0 30XBRD TIE 6 (SUTURE) ×2 IMPLANT
SUT SILK 1 SH (SUTURE) ×30 IMPLANT
SUT VIC AB 0 CT1 36 (SUTURE) ×6 IMPLANT
SUT VIC AB 2-0 CT1 27 (SUTURE) ×2
SUT VIC AB 2-0 CT1 TAPERPNT 27 (SUTURE) ×4 IMPLANT
SUT VICRYL 2 TP 1 (SUTURE) ×3 IMPLANT
SYR 10ML SLIP (SYRINGE) ×3 IMPLANT
SYR BULB IRRIG 60ML STRL (SYRINGE) ×3 IMPLANT
TAPE ADH 3 LX (MISCELLANEOUS) ×3 IMPLANT
TAPE TRANSPORE STRL 2 31045 (GAUZE/BANDAGES/DRESSINGS) IMPLANT
TROCAR FLEXIPATH 20X80 (ENDOMECHANICALS) IMPLANT
TROCAR FLEXIPATH THORACIC 15MM (ENDOMECHANICALS) IMPLANT
TUBING CONNECTING 10 (TUBING) ×3 IMPLANT
WATER STERILE IRR 1000ML POUR (IV SOLUTION) ×3 IMPLANT
YANKAUER SUCT BULB TIP FLEX NO (MISCELLANEOUS) ×3 IMPLANT

## 2015-09-28 NOTE — OR Nursing (Signed)
Patient arrived on 2lnp. Chest tubes intact draining slight amount of bloody drainage.  SCD's applied on both legs. Blood sugar obtained. IV in right arm flushes easily and normal saline initiated. Spent time discussing surgery and future plans with brothers. Many questions asked and answered regarding the viability of patient going home vs SNF.  Family will pursue placement with the ultimate decision to take patient back home. Dr Thelma Bargeaks here to speak with family. Patient does not appear to be in any discomfort.  Occasionally has a deep junky sounding cough but no sputum present.

## 2015-09-28 NOTE — Op Note (Signed)
  09/21/2015 - 09/28/2015  6:05 PM  PATIENT:  Sean [name]  50 y.o. male  PRE-OPERATIVE DIAGNOSIS:  Empyema left chest  POST-OPERATIVE DIAGNOSIS:  Same with left lower lobe abscess  PROCEDURE:  #1 preoperative bronchoscopy to assess endobronchial anatomy #2 muscle-sparing left thoracotomy with decortication of visceral parietal pleura  SURGEON:  Surgeon(s) and Role:    * Hulda Marinimothy Toshi Ishii, MD - Primary  ASSISTANTS: None  ANESTHESIA: Gen.  INDICATIONS FOR PROCEDURE this is a 50 year old gentleman recently admitted with fevers chills and left-sided chest pain who has had a left lower lobe pneumonia and empyema. He underwent 2 percutaneous pigtail catheter insertions as well as intrapleural thrombolytics. Despite this the pleural effusion persisted and he had an elevated white blood cell count with fever and he was taken to the operating room for drainage. The indications and risks of the procedure were explained to the patient's brother who provided consent.  DICTATION: Patient was brought to the operating suite and placed in the supine position. General endotracheal anesthesia was given through a double-lumen tube. Preoperative bronchoscopy was carried out. The right side was clear. The left side could not be adequately visualized because of the marked purulent secretions that were present. Through the small bronchoscope I could not suction these. Therefore the patient was turned for a left thoracotomy.  The right groin was prepped and draped in usual sterile fashion. A percutaneous right common femoral arterial line was placed using sterile technique.  The patient was then turned for a left thoracotomy. All pressure points were carefully padded. Patient was prepped and draped in usual sterile fashion. A muscle-sparing thoracotomy was performed. The latissimus and serratus muscles were retracted and the chest was entered through approximately the sixth intercostal space. Upon entering the chest we  could see that there is a large amount of undrained thick fibrinous material present. These were multiple loculations scattered throughout the hemithorax and these were all taken down. The lung was peeled from the chest wall and all undrained fluid collections were aspirated. Within the left lower lobe there were a number of small less than 1 cm necrotic areas which were not fluctuant. I attempted to open these but they were more solid and represent a necrotic lung as opposed to a drainable abscess. After decortication of visceral parietal pleura 3 chest tubes were inserted. Two 32 Blake and 1 28 Blake were inserted. The 28 Blake was inserted along the diaphragm underneath the lung. The anterior tube was anteriorly placed in the posterior tube was placed along the paravertebral space.  The tubes were secured to the skin. A thoracoscope was then used to visualize the entrance sites of the chest tubes. They were free of any drainage. We again copiously irrigated the chest and suctioned it dry. The chest was then closed. #2 Vicryl pericostal sutures approximated the ribs. The muscles of the chest wall were allowed to return to their normal anatomic position. The subcutaneous tissues were drained with a Blake drain. The Scarpa's fascia was closed with a 3-0 Vicryl and the skin was closed with skin clips.  Sterile dressings were then applied. The patient was extubated and taken to the recovery room in critical but stable condition.   Hulda Marinimothy Quanita Barona, MD

## 2015-09-28 NOTE — Transfer of Care (Signed)
Immediate Anesthesia Transfer of Care Note  Patient: Sean [name]  Procedure(s) Performed: Procedure(s): VIDEO BRONCHOSCOPY (N/A) THORACOTOMY MAJOR WITH DECORTICATION (Left)  Patient Location: PACU  Anesthesia Type:General and Epidural  Level of Consciousness: awake and alert   Airway & Oxygen Therapy: Patient Spontanous Breathing and Patient connected to face mask oxygen  Post-op Assessment: Report given to RN and Post -op Vital signs reviewed and stable  Post vital signs: stable  Last Vitals:  Filed Vitals:   09/28/15 1337 09/28/15 1756  BP: 127/71 132/60  Pulse: 90 96  Temp:  36.8 C  Resp: 16 25    Complications: No apparent anesthesia complications

## 2015-09-28 NOTE — Progress Notes (Signed)
[facility]. Patient off floor at this time.

## 2015-09-28 NOTE — Progress Notes (Addendum)
[facility] [place] [facility]        Dacoda [name] was admitted to the Hospital on 09/21/2015 and is still in the hospital. He is very Sick will need surgery. His brother Luan PullingMumtaz, Seif has been in the hosptial with him please exuse Mr. Luisa HartSeif from work for 09/26/2015 to 09/29/15.      Call Auburn BilberryShreyang Patel MD with questions.  Auburn BilberryPATEL, SHREYANG M.D on 09/28/2015,at 9:29 AM  [facility] [place] [facility]    Office  [phone]

## 2015-09-28 NOTE — Progress Notes (Signed)
eLink Physician-Brief Progress Note Patient Name: Sean [name] DOB: 07/31/1965 MRN: [id number]   Date of Service  09/28/2015  HPI/Events of Note  S/p thoracotomy with decortication. Camera check, pt awake, alert, pain controlled.   eICU Interventions  Pt doing well, vitals stable, no intervention at this time.         [name] 09/28/2015, 8:43 PM

## 2015-09-28 NOTE — Progress Notes (Signed)
Patient ID: Sean [name], male   DOB: 1966/02/06, 50 y.o.   MRN: [id number] [facility] [place] [facility]   PATIENT NAME: Sean [name]    MR#:  [id number]  DATE OF BIRTH:  1966/02/06  SUBJECTIVE:   Persistent cough and shortness of breath no significant improvement in condition, WBC continues to be elevated   REVIEW OF SYSTEMS:   Review of Systems  Constitutional: No fever. Negative for chills and weight loss.  HENT: Negative for ear discharge, ear pain and nosebleeds.   Eyes: Negative for blurred vision, pain and discharge.  Respiratory: Positive for cough and shortness of breath. Negative for sputum production, wheezing and stridor.   Cardiovascular: Negative for chest pain, palpitations, orthopnea and PND.  Gastrointestinal: Negative for nausea, vomiting, abdominal pain and diarrhea.  Genitourinary: Negative for urgency and frequency.  Musculoskeletal: Negative for back pain and joint pain.  Neurological: Persistent weakness. Negative for sensory change, speech change and focal weakness.  Psychiatric/Behavioral: Negative for depression and hallucinations. The patient is not nervous/anxious.   All other systems reviewed and are negative.  Tolerating Diet:yes Tolerating PT pending  DRUG ALLERGIES:   Allergies  Allergen Reactions  . Penicillins Other (See Comments)    Reaction:  Unknown     VITALS:  Blood pressure 110/64, pulse 90, temperature 98.8 F (37.1 C), temperature source Oral, resp. rate 18, height 5\' 6"  (1.676 m), weight 57 kg (125 lb 10.6 oz), SpO2 100 %.  PHYSICAL EXAMINATION:   Physical Exam  GENERAL:  50 y.o.-year-old patient lying in the bed with no acute distress.  EYES: Pupils equal, round, reactive to light and accommodation. No scleral icterus. Extraocular muscles intact.  HEENT: Head atraumatic, normocephalic. Oropharynx and nasopharynx clear.  NECK:  Supple, no jugular venous distention. No thyroid enlargement, no  tenderness.  LUNGS: decreased breath sounds left LL, no wheezing, no rales, no rhonchi. No use of accessory muscles of respiration. CARDIOVASCULAR: S1, S2 normal. No murmurs, rubs, or gallops.  ABDOMEN: Soft, nontender, nondistended. Bowel sounds present. No organomegaly or mass.  EXTREMITIES: No cyanosis, clubbing or edema b/l.    NEUROLOGIC: Cranial nerves II through XII are intact. No focal Motor or sensory deficits b/l.   PSYCHIATRIC:  patient is alert and oriented x 3.  SKIN: No obvious rash, lesion, or ulcer.   LABORATORY PANEL:  CBC  Recent Labs Lab 09/27/15 0456  WBC 25.5*  HGB 11.0*  HCT 32.6*  PLT 457*    Chemistries   Recent Labs Lab 09/21/15 1319  09/27/15 0456  NA 127*  < > 129*  K 4.9  < > 4.5  CL 90*  < > 99*  CO2 18*  < > 26  GLUCOSE 340*  < > 252*  BUN 29*  < > 22*  CREATININE 1.13  < > 0.70  CALCIUM 9.1  < > 7.3*  AST 41  --   --   ALT 34  --   --   ALKPHOS 69  --   --   BILITOT 1.1  --   --   < > = values in this interval not displayed. Cardiac Enzymes  Recent Labs Lab 09/21/15 2346  TROPONINI 0.08*   RADIOLOGY:  Dg Chest 1 View  09/27/2015  CLINICAL DATA:  Pleural effusion. EXAM: CHEST 1 VIEW COMPARISON:  09/26/2015. FINDINGS: Left chest drainage catheter in stable position. Mediastinum and hilar structures are normal. Persistent left lower lobe infiltrate left pleural effusion again noted. Persistent pleural-based density  noted over the left upper chest most likely loculated pleural effusion. No interim change. Low lung volumes. No pneumothorax. IMPRESSION: 1. Left chest drainage catheter in stable position. Persistent small left pleural effusion. Persistent pleural-based density left upper chest consistent with small loculated effusion. 2.  Persistent left lower lobe infiltrate. Electronically Signed   By: Maisie Fus  Register   On: 09/27/2015 07:25   ASSESSMENT AND PLAN:  Sean [name] is a 50 y.o. male with a known history of Hypertension and  diabetes and mental retardation. With complicated pneumonia and loculated empyema  1.Sepsis with  left LL pneumonia and pleural effusion/loculated empyema ,  no significant plan for decortication in the OR today. Obtain intraoperative cultures Continue current antibiotics.   2.Lactic acidosis. Due to sepsis with left lower lobe pneumonia -supportive care -IVF  3.Elevated troponin.  Due to demand ischemia will need outpatient cardiac stress test and evaluation  4.Hyponatremia. 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 Due to nothing by mouth status continue to monitor continue low-dose Lantus  6. Hypertension.  Continue metoprolol bid for tachycardia  Case discussed with Care Management/Social Worker. Management plans discussed with the patient, family and they are in agreement.  CODE STATUS: full  DVT Prophylaxis: lovenox  TOTAL TIME TAKING CARE OF THIS PATIENT:42minutes.      Note: This dictation was prepared with Dragon dictation along with smaller phrase technology. Any transcriptional errors that result from this process are unintentional.  Auburn Bilberry M.D on 09/28/2015 at 9:32 AM  Between 7am to 6pm - Pager - [phone]  After 6pm go to [email or web address] - password [email or web address] [facility]  [place] [facility]  Office  [phone]  CC: Primary care physician; Emogene Morgan, MD

## 2015-09-28 NOTE — Interval H&P Note (Signed)
History and Physical Interval Note:  09/28/2015 12:04 PM  Sean [name]  has presented today for surgery, with the diagnosis of N  The various methods of treatment have been discussed with the patient and family. After consideration of risks, benefits and other options for treatment, the patient has consented to  Procedure(s): VIDEO BRONCHOSCOPY (N/A) THORACOTOMY MAJOR WITH DECORTICATION (Left) as a surgical intervention .  The patient's history has been reviewed, patient examined, no change in status, stable for surgery.  I have reviewed the patient's chart and labs.  Questions were answered to the patient's satisfaction.     Hulda Marinimothy Zamyra Allensworth

## 2015-09-28 NOTE — H&P (View-Only) (Signed)
He continues to have elevated WBC though Tmax is less than 100.  Via his brother, patient states he still has some pain and shortness of breath  His lungs are equal anteriorly  Heart regular CTsite is dry.  No erythema.  I have reviewed today's CXRay with the brother and discussed the options of thoracotomy and decortication.  There remains some undrained fluid and with his WBC persistently elevated, I think he would benefit from decortication.  Will hold Lovenox.  Will check labs.  Discussed surgery with the brother and reviewed options.  Risks discussed as well.  He is aware that the patient may be discharged with chest tubes in place.  Will plan for surgery tomorrow.  Tim Tashea Othman. 

## 2015-09-28 NOTE — Anesthesia Procedure Notes (Addendum)
Epidural Patient location during procedure: pre-op Start time: 09/28/2015 1:20 PM End time: 09/28/2015 1:40 PM  Staffing Anesthesiologist: Roslynn AmblePATTERSON, ELENA O  Preanesthetic Checklist Completed: patient identified, site marked, surgical consent, pre-op evaluation, timeout performed, IV checked, risks and benefits discussed, monitors and equipment checked and post-op pain management  Epidural Patient position: sitting Prep: Betadine Patient monitoring: heart rate, cardiac monitor, continuous pulse ox and blood pressure Approach: right paramedian Location: thoracic (1-12) Injection technique: LOR air  Needle:  Needle type: Tuohy  Needle gauge: 17 G Needle length: 9 cm Needle insertion depth: 4 cm Catheter type: closed end flexible Catheter size: 19 Gauge Catheter at skin depth: 9 cm Test dose: negative and 1.5% lidocaine with Epi 1:200 K  Assessment Sensory level: T7 Events: blood not aspirated, injection not painful and no injection resistance  Additional Notes At surgeon's request, thoracic epidural placed for post op pain control.  Pt sitting in preop, monitors on area skin local with lidocaine 1%.Marland Kitchen. LOR at 4 cm. No complications.Reason for block:post-op pain management  Procedure Name: Intubation Performed by: Casey BurkittHOANG, Nyx Keady Pre-anesthesia Checklist: Emergency Drugs available, Patient identified, Suction available, Patient being monitored and Timeout performed Patient Re-evaluated:Patient Re-evaluated prior to inductionOxygen Delivery Method: Circle system utilized Preoxygenation: Pre-oxygenation with 100% oxygen Intubation Type: IV induction Ventilation: Mask ventilation without difficulty Laryngoscope Size: Mac and 3 Grade View: Grade I Tube type: Oral Endobronchial tube: Left and Double lumen EBT and 39 Fr Number of attempts: 1 Airway Equipment and Method: Stylet Placement Confirmation: ETT inserted through vocal cords under direct vision,  positive ETCO2 and breath  sounds checked- equal and bilateral Tube secured with: Tape Dental Injury: Teeth and Oropharynx as per pre-operative assessment

## 2015-09-28 NOTE — Progress Notes (Signed)
Inpatient Diabetes Program Recommendations  AACE/ADA: New Consensus Statement on Inpatient Glycemic Control (2015)  Target Ranges:  Prepandial:   less than 140 mg/dL      Peak postprandial:   less than 180 mg/dL (1-2 hours)      Critically ill patients:  140 - 180 mg/dL  Results for Sean [name], Sean [name] (MRN [id number]) as of 09/28/2015 11:00  Ref. Range 09/27/2015 07:41 09/27/2015 11:31 09/27/2015 11:32 09/27/2015 16:31 09/27/2015 21:24 09/28/2015 07:25  Glucose-Capillary Latest Ref Range: 65-99 mg/dL 409219 (H) 811349 (H) 914330 (H) 280 (H) 188 (H) 145 (H)   Review of Glycemic Control  Current orders for Inpatient glycemic control: Lantus 10 units QHS, Novolog 0-15 units TID with meals, Novolog 0-5 units QHS  Inpatient Diabetes Program Recommendations: Correction (SSI): Please consider changing frequency of CBGs and Novolog to Q4H while NPO. HgbA1C: Please consider ordering an A1C to evaluate glycemic control over the past 2-3 months.  Thanks, Orlando PennerMarie Lorae Roig, RN, MSN, CDE Diabetes Coordinator Inpatient Diabetes Program [phone] (Team Pager from 8am to 5pm) [phone] ([facility] office) [phone] [facility] office) [phone] [facility] office)

## 2015-09-29 ENCOUNTER — Inpatient Hospital Stay: Payer: Medicaid Other

## 2015-09-29 ENCOUNTER — Encounter: Payer: Self-pay | Admitting: Cardiothoracic Surgery

## 2015-09-29 LAB — TYPE AND SCREEN
ABO/RH(D): AB POS
ANTIBODY SCREEN: NEGATIVE
UNIT DIVISION: 0
Unit division: 0

## 2015-09-29 LAB — GLUCOSE, CAPILLARY
GLUCOSE-CAPILLARY: 261 mg/dL — AB (ref 65–99)
GLUCOSE-CAPILLARY: 277 mg/dL — AB (ref 65–99)
GLUCOSE-CAPILLARY: 289 mg/dL — AB (ref 65–99)
Glucose-Capillary: 314 mg/dL — ABNORMAL HIGH (ref 65–99)

## 2015-09-29 LAB — COMPREHENSIVE METABOLIC PANEL
ALBUMIN: 1.8 g/dL — AB (ref 3.5–5.0)
ALK PHOS: 57 U/L (ref 38–126)
ALT: 19 U/L (ref 17–63)
AST: 31 U/L (ref 15–41)
Anion gap: 4 — ABNORMAL LOW (ref 5–15)
BILIRUBIN TOTAL: 0.7 mg/dL (ref 0.3–1.2)
BUN: 17 mg/dL (ref 6–20)
CO2: 24 mmol/L (ref 22–32)
CREATININE: 0.65 mg/dL (ref 0.61–1.24)
Calcium: 6.7 mg/dL — ABNORMAL LOW (ref 8.9–10.3)
Chloride: 102 mmol/L (ref 101–111)
GFR calc Af Amer: >60 mL/min (ref >60)
GLUCOSE: 270 mg/dL — AB (ref 65–99)
POTASSIUM: 4.8 mmol/L (ref 3.5–5.1)
Sodium: 130 mmol/L — ABNORMAL LOW (ref 135–145)
TOTAL PROTEIN: 5.6 g/dL — AB (ref 6.5–8.1)

## 2015-09-29 LAB — CBC
HEMATOCRIT: 29.8 % — AB (ref 40.0–52.0)
HEMOGLOBIN: 10 g/dL — AB (ref 13.0–18.0)
MCH: 29.1 pg (ref 26.0–34.0)
MCHC: 33.7 g/dL (ref 32.0–36.0)
MCV: 86.5 fL (ref 80.0–100.0)
Platelets: 558 10*3/uL — ABNORMAL HIGH (ref 150–440)
RBC: 3.45 MIL/uL — AB (ref 4.40–5.90)
RDW: 13.9 % (ref 11.5–14.5)
WBC: 18.3 10*3/uL — AB (ref 3.8–10.6)

## 2015-09-29 LAB — PREPARE RBC (CROSSMATCH)

## 2015-09-29 MED ORDER — INSULIN GLARGINE 100 UNIT/ML ~~LOC~~ SOLN
8.0000 [IU] | Freq: Every day | SUBCUTANEOUS | Status: DC
  Administered 2015-09-29: 8 [IU] via SUBCUTANEOUS
  Filled 2015-09-29 (×2): qty 0.08

## 2015-09-29 MED ORDER — OXYCODONE-ACETAMINOPHEN 5-325 MG PO TABS: 1.0000 | ORAL_TABLET | Freq: Four times a day (QID) | ORAL | Status: DC | PRN

## 2015-09-29 MED ORDER — ONDANSETRON HCL 4 MG PO TABS: 4.0000 mg | ORAL_TABLET | Freq: Three times a day (TID) | ORAL | Status: DC | PRN

## 2015-09-29 MED ORDER — ALBUTEROL SULFATE (2.5 MG/3ML) 0.083% IN NEBU
2.5000 mg | INHALATION_SOLUTION | RESPIRATORY_TRACT | Status: DC | PRN
  Filled 2015-09-29: qty 3

## 2015-09-29 NOTE — Care Management (Signed)
Transferred to icu s/p decortication of empyema in left chest.  Has jp drain and chest tube.  Physical therapy has  recommended home health.  Orders present for patient to transfer out of icu

## 2015-09-29 NOTE — Progress Notes (Signed)
[facility] INFECTIOUS DISEASE PROGRESS NOTE Date of Admission:  09/21/2015     ID: Sean [name] is a 50 y.o. male with Pna, pleural effusion   Active Problems:   Sepsis (HCC)   Pneumonia   Hyponatremia   Empyema (HCC)   Subjective: [name] decortication 4/11 - reviewed note- lots of fibrinous tissue. Cultures pending   ROS  Unable to obtain due to language  Medications:  Antibiotics Given (last 72 hours)    Date/Time Action Medication Dose Rate   09/26/15 1657 Given   levofloxacin (LEVAQUIN) IVPB 750 mg 750 mg 100 mL/hr   09/26/15 1840 Given   metroNIDAZOLE (FLAGYL) IVPB 500 mg 500 mg 100 mL/hr   09/27/15 0459 Given   metroNIDAZOLE (FLAGYL) IVPB 500 mg 500 mg 100 mL/hr   09/27/15 1118 Given   metroNIDAZOLE (FLAGYL) IVPB 500 mg 500 mg 100 mL/hr   09/27/15 1721 Given   levofloxacin (LEVAQUIN) IVPB 750 mg 750 mg 100 mL/hr   09/27/15 1908 Given   metroNIDAZOLE (FLAGYL) IVPB 500 mg 500 mg 100 mL/hr   09/28/15 0224 Given   metroNIDAZOLE (FLAGYL) IVPB 500 mg 500 mg 100 mL/hr   09/28/15 1103 Given   metroNIDAZOLE (FLAGYL) IVPB 500 mg 500 mg 100 mL/hr   09/28/15 1721 Given   levofloxacin (LEVAQUIN) IVPB 750 mg 750 mg    09/29/15 0330 Given   metroNIDAZOLE (FLAGYL) IVPB 500 mg 500 mg 100 mL/hr   09/29/15 1028 Given   metroNIDAZOLE (FLAGYL) IVPB 500 mg 500 mg 100 mL/hr     . albuterol  2.5 mg Nebulization Q4H WA  . bisacodyl  10 mg Oral Daily  . insulin aspart  0-15 Units Subcutaneous TID WC  . insulin aspart  0-5 Units Subcutaneous QHS  . insulin glargine  8 Units Subcutaneous QHS  . levofloxacin (LEVAQUIN) IV  750 mg Intravenous Q24H  . metoprolol tartrate  25 mg Oral BID  . metronidazole  500 mg Intravenous Q8H  . nystatin  5 mL Oral QID    Objective: Vital signs in last 24 hours: Temp:  [98.2 F (36.8 C)-98.8 F (37.1 C)] 98.7 F (37.1 C) (04/12 0740) Pulse Rate:  [83-110] 103 (04/12 1200) Resp:  [15-38] 28 (04/12 1200) BP: (96-132)/(57-83) 106/66 mmHg (04/12  1200) SpO2:  [94 %-100 %] 100 % (04/12 1200) Arterial Line BP: (89-131)/(53-128) 125/66 mmHg (04/12 0900) Weight:  [60.7 kg (133 lb 13.1 oz)] 60.7 kg (133 lb 13.1 oz) (04/11 2000) Constitutional: thin, nad HENT: perrla Mouth/Throat: Oropharynx is clear and moist. No oropharyngeal exudate.  Cardiovascular: Normal rate, regular rhythm and normal heart sounds. Exam reveals no gallop and no friction rub.  No murmur heard.  Pulmonary/Chest: bibasilar crackles and rhonchi Chest tube to suction with ss fluid Abdominal: Soft. Bowel sounds are normal. He exhibits no distension. There is no tenderness.  Lymphadenopathy: He has no cervical adenopathy.  Neurological: He is alert and interactive Skin: Skin is warm and dry. No rash noted. No erythema.  Psychiatric: He has a normal mood and affect. His behavior is normal.   Lab Results  Recent Labs  09/28/15 1853 09/29/15 0449  WBC 23.8* 18.3*  HGB 9.8* 10.0*  HCT 29.6* 29.8*  NA 130* 130*  K 4.8 4.8  CL 103 102  CO2 20* 24  BUN 22* 17  CREATININE 0.65 0.65    Microbiology: Results for orders placed or performed during the hospital encounter of 09/21/15  Urine culture     Status: None   Collection Time:  09/21/15  1:19 PM  Result Value Ref Range Status   Specimen Description URINE, RANDOM  Final   Special Requests NONE  Final   Culture NO GROWTH 1 DAY  Final   Report Status 09/22/2015 FINAL  Final  Blood Culture (routine x 2)     Status: None   Collection Time: 09/21/15  3:34 PM  Result Value Ref Range Status   Specimen Description BLOOD LEFT ASSIST CONTROL  Final   Special Requests BOTTLES DRAWN AEROBIC AND ANAEROBIC  1CC  Final   Culture NO GROWTH 5 DAYS  Final   Report Status 09/26/2015 FINAL  Final  Rapid Influenza A&B Antigens ([facility] only)     Status: None   Collection Time: 09/21/15  3:34 PM  Result Value Ref Range Status   Influenza A ([facility]) NEGATIVE NEGATIVE Final   Influenza B ([facility]) NEGATIVE NEGATIVE Final  Blood  Culture (routine x 2)     Status: None   Collection Time: 09/21/15  3:40 PM  Result Value Ref Range Status   Specimen Description BLOOD RIGHT ASSIST CONTROL  Final   Special Requests BOTTLES DRAWN AEROBIC AND ANAEROBIC  1CC  Final   Culture NO GROWTH 5 DAYS  Final   Report Status 09/26/2015 FINAL  Final  MRSA PCR Screening     Status: None   Collection Time: 09/22/15  3:42 PM  Result Value Ref Range Status   MRSA by PCR NEGATIVE NEGATIVE Final    Comment:        The GeneXpert MRSA Assay (FDA approved for NASAL specimens only), is one component of a comprehensive MRSA colonization surveillance program. It is not intended to diagnose MRSA infection nor to guide or monitor treatment for MRSA infections.   Culture, body fluid-bottle     Status: None (Preliminary result)   Collection Time: 09/23/15  3:15 PM  Result Value Ref Range Status   Specimen Description FLUID  Final   Special Requests NONE  Final   Gram Stain   Final    MANY WBC SEEN MANY GRAM POSITIVE COCCI IN CHAINS    Culture NO GROWTH 4 DAYS  Final   Report Status PENDING  Incomplete  Tissue culture     Status: None (Preliminary result)   Collection Time: 09/28/15  3:29 PM  Result Value Ref Range Status   Specimen Description TISSUE  Final   Special Requests NONE  Final   Gram Stain PENDING  Incomplete   Culture NO GROWTH < 24 HOURS  Final   Report Status PENDING  Incomplete  MRSA PCR Screening     Status: None   Collection Time: 09/28/15  8:47 PM  Result Value Ref Range Status   MRSA by PCR NEGATIVE NEGATIVE Final    Comment:        The GeneXpert MRSA Assay (FDA approved for NASAL specimens only), is one component of a comprehensive MRSA colonization surveillance program. It is not intended to diagnose MRSA infection nor to guide or monitor treatment for MRSA infections.     Studies/Results: X-ray Chest Pa Or Ap  09/28/2015  CLINICAL DATA:  Status post bronchoscopy and thoracotomy EXAM: CHEST 1 VIEW  COMPARISON:  09/27/2015 FINDINGS: Cardiac shadow is stable. Left-sided chest tubes are now seen. Lateral left pneumothorax is noted inferiorly. Some left basilar atelectasis is seen. A surgical drain is noted in the lateral chest wall. The right lung remains clear. The bony structures are within normal limits. IMPRESSION: Status post left thoracotomy with inferior lateral  pneumothorax noted. Left basilar atelectasis is seen as well. Electronically Signed   By: Alcide Clever M.D.   On: 09/28/2015 18:23   Dg Chest Port 1 View  09/29/2015  CLINICAL DATA:  Postop.  Recent left lower lobe pneumonia. EXAM: PORTABLE CHEST 1 VIEW COMPARISON:  09/28/2015; 09/27/2015; 09/21/2015; chest CT -09/21/2015 FINDINGS: Grossly unchanged cardiac silhouette and mediastinal contours. Stable positioning of support apparatus. No pneumothorax. Small loculated left-sided effusion with associated left mid and lower lung heterogeneous/consolidative opacities are unchanged. No new focal airspace opacities. Unchanged bones. IMPRESSION: 1.  Stable positioning of support apparatus.  No pneumothorax. 2. Unchanged small loculated left-sided effusion with associated left mid and lower lung heterogeneous/consolidative opacities, atelectasis versus infiltrate. Electronically Signed   By: Simonne Come M.D.   On: 09/29/2015 09:03    Assessment/Plan: Sean [name] is a 50 y.o. male With a history of mental retardation admitted with a weeklong illness with fevers chills cough and pleuritic chest pain. CT scan does show left lower lobe pneumonia with some effusion. His white count is elevated. He has no known TB contacts. No prior prodrome of illness. Suspect this is a post influenza or post viral bacterial pneumonia. MRSA PCR neg, BCX neg.  Cultures all neg - had GPC in chains 4/6 from pleural fluid but cx negative WBC decreased today after surgery  Recommendations Continue levofloxacin - will need prolonged course  Can stop flagyl  AThank  you very much for the consult. Will follow with you.  Sean [name]   09/29/2015, 1:42 PM

## 2015-09-29 NOTE — Progress Notes (Signed)
Resting comfortably this morning  Afebrile.  BP stable in the 130/70 range.  Sats 100%  Chest tube and JP draining serous fluid. Wounds covered.  Heart regular Lungs equal bilaterally but with slightly diminished breath sounds on the left  Labs OK  Will ask PT to see for ambulation Discontinue art line Begin diet May transfer to floor from my standpoint.  [employer]on Energyim Shade Kaley.

## 2015-09-29 NOTE — Anesthesia Post-op Follow-up Note (Signed)
  Anesthesia Pain Follow-up Note  Patient: Sean [name]  Day #: 1  Date of Follow-up: 09/29/2015 Time: 7:36 AM  Last Vitals:  Filed Vitals:   09/29/15 0500 09/29/15 0600  BP: 105/70 100/83  Pulse: 88 83  Temp:    Resp: 31 29    Level of Consciousness: alert  Pain: moderate   Side Effects:None  Catheter Site Exam:clean  Plan: Modify therapy to improve pain control IC RN notified to increase rate from 135ml/hr to 177ml/hr  Jules SchickLogan,  Laurabeth Yip P

## 2015-09-29 NOTE — Progress Notes (Signed)
Patient ID: Sean [name], male   DOB: 07/14/1965, 50 y.o.   MRN: [id number] [facility] - [place] at [facility]   PATIENT NAME: Sean [name]    MR#:  [id number]  DATE OF BIRTH:  [birth year]  SUBJECTIVE:    patient underwent  muscle-sparing left thoracotomy with decortication of visceral parietal pleura yesterday,  Currently offers no symptoms  REVIEW OF SYSTEMS:   Review of Systems  Constitutional: No fever  Negative for chills and weight loss.  HENT: Negative for ear discharge, ear pain and nosebleeds.   Eyes: Negative for blurred vision, pain and discharge.  Respiratory: Denies cough and no. Negative for sputum production, wheezing and stridor.   Cardiovascular: Negative for chest pain, palpitations, orthopnea and PND.  Gastrointestinal: Negative for nausea, vomiting, abdominal pain and diarrhea.  Genitourinary: Negative for urgency and frequency.  Musculoskeletal: Negative for back pain and joint pain.  Neurological: Persistent weakness. Negative for sensory change, speech change and focal weakness.  Psychiatric/Behavioral: Negative for depression and hallucinations. The patient is not nervous/anxious.   All other systems reviewed and are negative.  Tolerating Diet:yes Tolerating PT pending  DRUG ALLERGIES:   Allergies  Allergen Reactions  . Penicillins Other (See Comments)    Reaction:  Unknown     VITALS:  Blood pressure 119/72, pulse 101, temperature 98.7 F (37.1 C), temperature source Oral, resp. rate 22, height  (1.651 m), weight 60.7 kg (133 lb 13.1 oz), SpO2 100 %.  PHYSICAL EXAMINATION:   Physical Exam  GENERAL:  50 y.o.-year-old patient lying in the bed with no acute distress.  EYES: Pupils equal, round, reactive to light and accommodation. No scleral icterus. Extraocular muscles intact.  HEENT: Head atraumatic, normocephalic. Oropharynx and nasopharynx clear.  NECK:  Supple, no jugular venous distention. No thyroid enlargement,  no tenderness.  LUNGS: decreased breath sounds left LL, no wheezing, no rales, no rhonchi. No use of accessory muscles of respiration. CARDIOVASCULAR: S1, S2 normal. No murmurs, rubs, or gallops.  ABDOMEN: Soft, nontender, nondistended. Bowel sounds present. No organomegaly or mass.  EXTREMITIES: No cyanosis, clubbing or edema b/l.    NEUROLOGIC: Cranial nerves II through XII are intact. No focal Motor or sensory deficits b/l.   PSYCHIATRIC:  patient is alert and oriented x 3.  SKIN: No obvious rash, lesion, or ulcer.   LABORATORY PANEL:  CBC  Recent Labs Lab 09/29/15 0449  WBC 18.3*  HGB 10.0*  HCT 29.8*  PLT 558*    Chemistries   Recent Labs Lab 09/29/15 0449  NA 130*  K 4.8  CL 102  CO2 24  GLUCOSE 270*  BUN 17  CREATININE 0.65  CALCIUM 6.7*  AST 31  ALT 19  ALKPHOS 57  BILITOT 0.7   Cardiac Enzymes No results for input(s): TROPONINI in the last 168 hours. RADIOLOGY:  X-ray Chest Pa Or Ap  09/28/2015  CLINICAL DATA:  Status post bronchoscopy and thoracotomy EXAM: CHEST 1 VIEW COMPARISON:  09/27/2015 FINDINGS: Cardiac shadow is stable. Left-sided chest tubes are now seen. Lateral left pneumothorax is noted inferiorly. Some left basilar atelectasis is seen. A surgical drain is noted in the lateral chest wall. The right lung remains clear. The bony structures are within normal limits. IMPRESSION: Status post left thoracotomy with inferior lateral pneumothorax noted. Left basilar atelectasis is seen as well. Electronically Signed   By: Alcide Clever M.D.   On: 09/28/2015 18:23   Dg Chest Port 1 View  09/29/2015  CLINICAL DATA:  Postop.  Recent left lower lobe pneumonia. EXAM: PORTABLE CHEST 1 VIEW COMPARISON:  09/28/2015; 09/27/2015; 09/21/2015; chest CT -09/21/2015 FINDINGS: Grossly unchanged cardiac silhouette and mediastinal contours. Stable positioning of support apparatus. No pneumothorax. Small loculated left-sided effusion with associated left mid and lower lung  heterogeneous/consolidative opacities are unchanged. No new focal airspace opacities. Unchanged bones. IMPRESSION: 1.  Stable positioning of support apparatus.  No pneumothorax. 2. Unchanged small loculated left-sided effusion with associated left mid and lower lung heterogeneous/consolidative opacities, atelectasis versus infiltrate. Electronically Signed   By: Simonne ComeJohn  Watts M.D.   On: 09/29/2015 09:03   ASSESSMENT AND PLAN:  Sean [name] is a 50 y.o. male with a known history of Hypertension and diabetes and mental retardation. With complicated pneumonia and loculated empyema  1.Sepsis with  left LL pneumonia and pleural effusion/loculated empyema ,  patient's status post decortication and chest tube in place continue anabiotic's  2.Lactic acidosis. Due to sepsis with left lower lobe pneumonia -Resolved   3.Elevated troponin.  Due to demand ischemia will need outpatient cardiac stress test and evaluation based on CT of the coronaries  4.Hyponatremia. 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 Resume low-dose Lantus   6. Hypertension.  Continue metoprolol bid for tachycardia  Case discussed with Care Management/Social Worker. Management plans discussed with the patient, family and they are in agreement.  CODE STATUS: full  DVT Prophylaxis: lovenox  TOTAL TIME TAKING CARE OF THIS PATIENT:5732minutes.      Note: This dictation was prepared with Dragon dictation along with smaller phrase technology. Any transcriptional errors that result from this process are unintentional.  Auburn BilberryPATEL, Monnie Anspach M.D on 09/29/2015 at 10:01 AM  Between 7am to 6pm - Pager - [phone]  After 6pm go to [email or web address] - password [email or web address] [facility]  [place] [facility] [place] [facility]  Office  [phone]  CC: Primary care physician; Emogene MorganAYCOCK, NGWE A, MD

## 2015-09-29 NOTE — Anesthesia Postprocedure Evaluation (Signed)
Anesthesia Post Note  Patient: Lavoris Hollander  Procedure(s) Performed: Procedure(s) (LRB): VIDEO BRONCHOSCOPY (N/A) THORACOTOMY MAJOR WITH DECORTICATION (Left)  Patient location during evaluation: ICU Anesthesia Type: Epidural and General Level of consciousness: awake Pain management: satisfactory to patient Vital Signs Assessment: post-procedure vital signs reviewed and stable Respiratory status: spontaneous breathing Cardiovascular status: stable Postop Assessment: no headache and no backache Anesthetic complications: no    Last Vitals:  Filed Vitals:   09/29/15 0500 09/29/15 0600  BP: 105/70 100/83  Pulse: 88 83  Temp:    Resp: 31 29    Last Pain:  Filed Vitals:   09/29/15 0640  PainSc: Asleep                 Jules SchickLogan,  Alphons Burgert P

## 2015-09-29 NOTE — Evaluation (Signed)
Physical Therapy Evaluation Patient Details Name: Sean [name] MRN: [id number] DOB: 17-Apr-1966 Today's Date: 09/29/2015   History of Present Illness  presented to ER 4/4 secondary to abdominal pain, cough and SOB; admitted with sepsis related to PNA and L pleural effusion/loculated empyema.  Hospital course significant for pleurx catheter placement (4/6) with revision/replacement (4/7) and subsequent L chest thoracotomy and decortication (4/11)  Clinical Impression  Upon evaluation, patient alert and oriented to basic information; follows simple commands.  Appears to comprehend instruction (partially in [place]) better than able to express information, despite use of Urdu interpreter.  Bilat UE/LE strength and ROM grossly symmetrical and WFL for basic transfers and mobility; mild/mod pain in L flank noted with facial grimacing during movement transitions.   Able to complete supine/sit with min/mod assist; sit/stand and bed/chair transfer (3-4 steps with RW), min assist +2 for safety and management of lines/tubes.  Fair functional strength/power without buckling, LOB noted during session.  Vitals stable and WFL on RA throughout session; will continue to progress distance and overall activity level in subsequent sessions (as RN available to convert chest tube to water seal). Would benefit from skilled PT to address above deficits and promote optimal return to PLOF; will defer formal discharge recommendations until additional mobility assessment complete.  Anticipate maximal benefit from home environment with HHPT given baseline cognitive deficits and language barrier if medically appropriate    Follow Up Recommendations Other (comment) (pending additional mobility assessment; will follow up next date.  Anticipate maximal benefit from home environment with HHPT given baseline cognitive deficits and language barrier if medically appropriate.)    Equipment Recommendations  Rolling walker with 5" wheels     Recommendations for Other Services       Precautions / Restrictions Precautions Precautions: Fall Precaution Comments: L chest tube (x3), L chest JP drain, CT to water seal for ambulation (per Dr. Thelma Bargeaks order), no lifting >15 lbs x4 weeks, pain epidural in place Restrictions Weight Bearing Restrictions: No      Mobility  Bed Mobility Overal bed mobility: Needs Assistance Bed Mobility: Supine to Sit     Supine to sit: Min assist;Mod assist     General bed mobility comments: for truncal elevation and protection of lines/tubes  Transfers Overall transfer level: Needs assistance Equipment used: Rolling walker (2 wheeled) Transfers: Sit to/from Stand Sit to Stand: Min assist;+2 safety/equipment         General transfer comment: cuing for hand placement  Ambulation/Gait Ambulation/Gait assistance: Min assist;+2 safety/equipment Ambulation Distance (Feet): 5 Feet Assistive device: Rolling walker (2 wheeled)       General Gait Details: step to gait pattern with decreased step height/length, fairly guarded, but no buckling or LOB.  Vitals stable and WFL.  Will plan to trial additional distance next session as tolerated (RN not available for CT to water seal during session)  Stairs            Wheelchair Mobility    Modified Rankin (Stroke Patients Only)       Balance Overall balance assessment: Needs assistance Sitting-balance support: No upper extremity supported;Feet supported Sitting balance-Leahy Scale: Good     Standing balance support: Bilateral upper extremity supported Standing balance-Leahy Scale: Fair                               Pertinent Vitals/Pain Pain Assessment: Faces Faces Pain Scale: Hurts little more Pain Location: L chest/flank Pain Descriptors / Indicators: Aching;Grimacing  Pain Intervention(s): Limited activity within patient's tolerance;Monitored during session;Repositioned    Home Living Family/patient expects to  be discharged to:: Private residence Living Arrangements: Other relatives (brother and sister-in-law) Available Help at Discharge: Family             Additional Comments: difficult to obtain full social assessment from patient; will confirm with brother as available    Prior Function Level of Independence: Independent               Hand Dominance        Extremity/Trunk Assessment   Upper Extremity Assessment: Overall WFL for tasks assessed           Lower Extremity Assessment: Overall WFL for tasks assessed (grossly at least 3+ to 4/5 throughout; able to support body weight without difficulty)         Communication   Communication: Interpreter utilized [employer] Urdu interpreter [phone]).  Patient appears to understand better than express.)  Cognition Arousal/Alertness: Awake/alert Behavior During Therapy: WFL for tasks assessed/performed Overall Cognitive Status: Difficult to assess (due to language barrier and history of MR; follows simple commands, slightly impulsive at times)                      General Comments      Exercises  Unsupported sitting balance edge of bed, distant sup/mod indep; able to shift weight without LOB.  Slightly impulsive, attempting to stand without therapist cuing/assist at times.  Constant cuing for awareness/position of lines/tubes.      Assessment/Plan    PT Assessment    PT Diagnosis Difficulty walking;Generalized weakness;Acute pain   PT Problem List    PT Treatment Interventions     PT Goals (Current goals can be found in the Care Plan section) Acute Rehab PT Goals PT Goal Formulation: Patient unable to participate in goal setting Time For Goal Achievement: 10/13/15 Potential to Achieve Goals: Good    Frequency     Barriers to discharge        Co-evaluation               End of Session   Activity Tolerance: Patient tolerated treatment well Patient left: in chair;with call bell/phone  within reach (alarm pad in place; box not available.  RN informed/aware of patient position and performance during session) Nurse Communication: Mobility status         Time: 9147-8295 PT Time Calculation (min) (ACUTE ONLY): 24 min   Charges:   PT Evaluation $PT Eval High Complexity: 1 Procedure PT Treatments $Therapeutic Activity: 8-22 mins   PT G Codes:        Courtnie Brenes H. Manson Passey, PT, DPT, NCS 09/29/2015, 3:28 PM [phone]

## 2015-09-29 NOTE — Consult Note (Signed)
Pharmacy Antibiotic Note  Orley [name] is a 50 y.o. male admitted on 09/21/2015 with pneumonia and pleural effusion.  Pharmacy has been consulted for levofloxacin  Plan: Will continue levofloxacin 750mg  IV Q24H.   Height: 5\' 5"  (165.1 cm) Weight: 133 lb 13.1 oz (60.7 kg) IBW/kg (Calculated) : 61.5  Temp (24hrs), Avg:98.6 F (37 C), Min:98.3 F (36.8 C), Max:98.8 F (37.1 C)   Recent Labs Lab 09/23/15 0445 09/23/15 1038 09/24/15 0618 09/26/15 0445 09/27/15 0456 09/28/15 1853 09/29/15 0449  WBC 26.8*  --  24.9* 26.3* 25.5* 23.8* 18.3*  CREATININE 0.75  --   --  0.68 0.70 0.65 0.65  VANCOTROUGH  --  <4*  --   --   --   --   --     Estimated Creatinine Clearance: 95.9 mL/min (by C-G formula based on Cr of 0.65).    Allergies  Allergen Reactions  . Penicillins Other (See Comments)    Reaction:  Unknown     Antimicrobials this admission: vancomycin 4/4 >>4/4  levofloxacin 4/4 >>  Metronidazole 4/5 >> 4/12   Microbiology results: 4/11 Tissue Culture: no growth < 24hr 4/4 Blood cultures x 2: no growth  4/4 UCx: NG, final 4/6 Pleural fluid: no growth  4/11 MRSA PCR: negative   Pharmacy will continue to monitor and adjust per consult.   Charlies SilversMichael Virginia Francisco 09/29/2015 8:16 PM

## 2015-09-30 LAB — CULTURE, BODY FLUID W GRAM STAIN -BOTTLE: Culture: NO GROWTH

## 2015-09-30 LAB — CULTURE, BODY FLUID-BOTTLE

## 2015-09-30 LAB — GLUCOSE, CAPILLARY
GLUCOSE-CAPILLARY: 146 mg/dL — AB (ref 65–99)
GLUCOSE-CAPILLARY: 189 mg/dL — AB (ref 65–99)
GLUCOSE-CAPILLARY: 207 mg/dL — AB (ref 65–99)
GLUCOSE-CAPILLARY: 280 mg/dL — AB (ref 65–99)
Glucose-Capillary: 247 mg/dL — ABNORMAL HIGH (ref 65–99)

## 2015-09-30 MED ORDER — INSULIN GLARGINE 100 UNIT/ML ~~LOC~~ SOLN
14.0000 [IU] | Freq: Every day | SUBCUTANEOUS | Status: DC
  Administered 2015-09-30 – 2015-10-06 (×7): 14 [IU] via SUBCUTANEOUS
  Filled 2015-09-30 (×9): qty 0.14

## 2015-09-30 MED ORDER — METOPROLOL TARTRATE 25 MG PO TABS
25.0000 mg | ORAL_TABLET | Freq: Two times a day (BID) | ORAL | Status: DC
Start: 2015-09-30 — End: 2015-10-09
  Administered 2015-09-30 – 2015-10-09 (×18): 25 mg via ORAL
  Filled 2015-09-30 (×19): qty 1

## 2015-09-30 MED ORDER — HYDROCORTISONE 1 % EX CREA
TOPICAL_CREAM | Freq: Two times a day (BID) | CUTANEOUS | Status: DC
  Administered 2015-09-30 – 2015-10-01 (×2): 1 via TOPICAL
  Administered 2015-10-01 – 2015-10-08 (×11): via TOPICAL
  Filled 2015-09-30: qty 28

## 2015-09-30 MED ORDER — DIPHENHYDRAMINE HCL 25 MG PO CAPS: 25.0000 mg | ORAL_CAPSULE | Freq: Three times a day (TID) | ORAL | Status: DC | PRN

## 2015-09-30 NOTE — Progress Notes (Signed)
PT Cancellation Note  Patient Details Name: Sean [name] MRN: [id number] DOB: Jun 04, 1966   Cancelled Treatment:    Reason Eval/Treat Not Completed: Other (comment) (Treatment session attempted. Per primary RN, patient has been slightly agitated this date; preparing from transfer to floor (Rm 126).  Requests therapist hold at this time and re-attempt once transfer complete.  Will re-attempt at later time/date as patient available and medically appropriate.)   Kanetra Ho H. Manson PasseyBrown, PT, DPT, NCS 09/30/2015, 3:25 PM [phone]

## 2015-09-30 NOTE — Progress Notes (Signed)
Patient ID: Sean [name], male   DOB: 1966-01-18, 50 y.o.   MRN: [id number] [facility] [place] [facility]   PATIENT NAME: Sean [name]    MR#:  [id number]  DATE OF BIRTH:  1966-01-18  SUBJECTIVE:    Complains of itching on the abdomen area no other complaints was to go home  REVIEW OF SYSTEMS:   Review of Systems  Constitutional: No fever  Negative for chills and weight loss.  HENT: Negative for ear discharge, ear pain and nosebleeds.   Eyes: Negative for blurred vision, pain and discharge.  Respiratory: Denies cough and no. Negative for sputum production, wheezing and stridor.   Cardiovascular: Negative for chest pain, palpitations, orthopnea and PND.  Gastrointestinal: Negative for nausea, vomiting, abdominal pain and diarrhea.  Genitourinary: Negative for urgency and frequency.  Musculoskeletal: Negative for back pain and joint pain.  Neurological: Positive weakness. Negative for sensory change, speech change and focal weakness.  Psychiatric/Behavioral: Negative for depression and hallucinations. The patient is not nervous/anxious.   All other systems reviewed and are negative.  Tolerating Diet:yes Tolerating PT pending  DRUG ALLERGIES:   Allergies  Allergen Reactions  . Penicillins Other (See Comments)    Reaction:  Unknown     VITALS:  Blood pressure 138/69, pulse 93, temperature 97.6 F (36.4 C), temperature source Oral, resp. rate 23, height 5\' 5"  (1.651 m), weight 60.7 kg (133 lb 13.1 oz), SpO2 93 %.  PHYSICAL EXAMINATION:   Physical Exam  GENERAL:  50 y.o.-year-old patient lying in the bed with no acute distress.  EYES: Pupils equal, round, reactive to light and accommodation. No scleral icterus. Extraocular muscles intact.  HEENT: Head atraumatic, normocephalic. Oropharynx and nasopharynx clear.  NECK:  Supple, no jugular venous distention. No thyroid enlargement, no tenderness.  LUNGS: decreased breath sounds left LL, no wheezing,  no rales, no rhonchi. No use of accessory muscles of respiration. CARDIOVASCULAR: S1, S2 normal. No murmurs, rubs, or gallops.  ABDOMEN: Soft, nontender, nondistended. Bowel sounds present. No organomegaly or mass.  EXTREMITIES: No cyanosis, clubbing or edema b/l.    NEUROLOGIC: Cranial nerves II through XII are intact. No focal Motor or sensory deficits b/l.   PSYCHIATRIC:  patient is alert and oriented x 3.  SKIN: No obvious rash, lesion, or ulcer.   LABORATORY PANEL:  CBC  Recent Labs Lab 09/29/15 0449  WBC 18.3*  HGB 10.0*  HCT 29.8*  PLT 558*    Chemistries   Recent Labs Lab 09/29/15 0449  NA 130*  K 4.8  CL 102  CO2 24  GLUCOSE 270*  BUN 17  CREATININE 0.65  CALCIUM 6.7*  AST 31  ALT 19  ALKPHOS 57  BILITOT 0.7   Cardiac Enzymes No results for input(s): TROPONINI in the last 168 hours. RADIOLOGY:  X-ray Chest Pa Or Ap  09/28/2015  CLINICAL DATA:  Status post bronchoscopy and thoracotomy EXAM: CHEST 1 VIEW COMPARISON:  09/27/2015 FINDINGS: Cardiac shadow is stable. Left-sided chest tubes are now seen. Lateral left pneumothorax is noted inferiorly. Some left basilar atelectasis is seen. A surgical drain is noted in the lateral chest wall. The right lung remains clear. The bony structures are within normal limits. IMPRESSION: Status post left thoracotomy with inferior lateral pneumothorax noted. Left basilar atelectasis is seen as well. Electronically Signed   By: Alcide CleverMark  Lukens M.D.   On: 09/28/2015 18:23   Dg Chest Port 1 View  09/29/2015  CLINICAL DATA:  Postop.  Recent left lower lobe  pneumonia. EXAM: PORTABLE CHEST 1 VIEW COMPARISON:  09/28/2015; 09/27/2015; 09/21/2015; chest CT -09/21/2015 FINDINGS: Grossly unchanged cardiac silhouette and mediastinal contours. Stable positioning of support apparatus. No pneumothorax. Small loculated left-sided effusion with associated left mid and lower lung heterogeneous/consolidative opacities are unchanged. No new focal  airspace opacities. Unchanged bones. IMPRESSION: 1.  Stable positioning of support apparatus.  No pneumothorax. 2. Unchanged small loculated left-sided effusion with associated left mid and lower lung heterogeneous/consolidative opacities, atelectasis versus infiltrate. Electronically Signed   By: Simonne Come M.D.   On: 09/29/2015 09:03   ASSESSMENT AND PLAN:  Sean [name] is a 50 y.o. male with a known history of Hypertension and diabetes and mental retardation. With complicated pneumonia and loculated empyema  1.Sepsis with  left LL pneumonia and pleural effusion/loculated empyema ,  patient's status post decortication and chest tube in place No growth from the cultures from the operating room  2.Lactic acidosis. Due to sepsis with left lower lobe pneumonia -Resolved   3.Elevated troponin.  Due to demand ischemia  Out patient stress test   4.Hyponatremia. 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 Blood glucose elevated I will increase his Lantus dose  6. Hypertension.  Continue metoprolol bid for tachycardia blood pressure stable  Case discussed with Care Management/Social Worker. Management plans discussed with the patient, family and they are in agreement.  CODE STATUS: full  DVT Prophylaxis: lovenox  TOTAL TIME TAKING CARE OF THIS PATIENT:27minutes.      Note: This dictation was prepared with Dragon dictation along with smaller phrase technology. Any transcriptional errors that result from this process are unintentional.  Auburn Bilberry M.D on 09/30/2015 at 10:56 AM  Between 7am to 6pm - Pager - [phone]  After 6pm go to [email or web address] - password [email or web address] [facility]  [place] [facility]  Office  [phone]  CC: Primary care physician; Emogene Morgan, MD

## 2015-09-30 NOTE — Progress Notes (Signed)
Received pt to 126 via bed per orderly Kathlene NovemberMike. Pt alert. No resp distress. Some tach.resp. Brother at bedside with pt. Chest tubes x 3 intact  Left  Mid chest. jp drain intact  And activated. Chest tube to  Neg 20 suction. No s/s crepitus. serosy  Yellowish color drainage. scds in place. Lungs diminished.responsive verbally. Speaks some english. conversating  In his native tongue with brother. Denies pain.  neuros intact  From  Epidural. Lines intact.

## 2015-09-30 NOTE — Consult Note (Signed)
Pharmacy Antibiotic Note  Sean [name] is a 50 y.o. male admitted on 09/21/2015 with pneumonia and pleural effusion.  Pharmacy has been consulted for levofloxacin  Plan: Will continue levofloxacin 750mg  IV Q24H.   Height: 5\' 5"  (165.1 cm) Weight: 133 lb 13.1 oz (60.7 kg) IBW/kg (Calculated) : 61.5  Temp (24hrs), Avg:98.7 F (37.1 C), Min:97.6 F (36.4 C), Max:99.8 F (37.7 C)   Recent Labs Lab 09/24/15 0618 09/26/15 0445 09/27/15 0456 09/28/15 1853 09/29/15 0449  WBC 24.9* 26.3* 25.5* 23.8* 18.3*  CREATININE  --  0.68 0.70 0.65 0.65    Estimated Creatinine Clearance: 95.9 mL/min (by C-G formula based on Cr of 0.65).    Allergies  Allergen Reactions  . Penicillins Other (See Comments)    Reaction:  Unknown     Antimicrobials this admission: vancomycin 4/4 >>4/4  levofloxacin 4/4 >>  Metronidazole 4/5 >> 4/12   Microbiology results: 4/11 Tissue Culture: no growth 4/4 Blood cultures x 2: negative 4/4 UCx: NG, final 4/6 Pleural fluid: no growth  4/11 MRSA PCR: negative   Pharmacy will continue to monitor and adjust per consult.   Luisa HartScott Jru Pense, PharmD Clinical Pharmacist   09/30/2015 11:45 AM

## 2015-09-30 NOTE — Progress Notes (Signed)
Inpatient Diabetes Program Recommendations  AACE/ADA: New Consensus Statement on Inpatient Glycemic Control (2015)  Target Ranges:  Prepandial:   less than 140 mg/dL      Peak postprandial:   less than 180 mg/dL (1-2 hours)      Critically ill patients:  140 - 180 mg/dL   Review of Glycemic Control  Results for Aileen PilotMUMTAZ, Jaivion (MRN [id number]) as of 09/30/2015 12:38  Ref. Range 09/29/2015 17:15 09/29/2015 21:23 09/30/2015 00:47 09/30/2015 07:21 09/30/2015 11:46  Glucose-Capillary Latest Ref Range: 65-99 mg/dL 244289 (H) 010314 (H) 272247 (H) 146 (H) 189 (H)   Diabetes history: Type 2 Outpatient Diabetes medications: Lantus 36 units qhs, Metformin 1000mg  bid Current orders for Inpatient glycemic control: Lantus 14 units qhs, Novolog 0-15 units tid, Novolog 0-5 units qhs  Inpatient Diabetes Program Recommendations: Please consider decreasing Lantus insulin to 10 units qhs (fasting blood sugar was near ideal today at 146mg /dl).  Consider adding 3 units of Novolog at meals (hold if he eats less than 50%) so he is consistently getting mealtime insulin which is needed (as evidenced by elevated post prandial blood sugars despite current correct doses)  Susette RacerJulie Shresta Risden, RN, BA, MHA, CDE Diabetes Coordinator Inpatient Diabetes Program  [phone] (Team Pager) [phone] [facility] Office) 09/30/2015 12:46 PM

## 2015-09-30 NOTE — Progress Notes (Signed)
Appears to be comfortable.  Speaking in complete sentences though I can not understand.  Afebrile.  BP good. JP and Chest Tubes draining small amounts of serous fluid.  No purulence or blood. Wounds redressed - clean and dry.  No erythema.  Wound encourage OOB and ambulate. May place chest tubes to water seal during ambulation Encourage oral nutrition.  Devon Energyim Sean [name].

## 2015-10-01 ENCOUNTER — Encounter: Payer: Self-pay | Admitting: Certified Registered Nurse Anesthetist

## 2015-10-01 ENCOUNTER — Inpatient Hospital Stay: Payer: Medicaid Other

## 2015-10-01 LAB — CBC
HEMATOCRIT: 28 % — AB (ref 40.0–52.0)
HEMOGLOBIN: 9.3 g/dL — AB (ref 13.0–18.0)
MCH: 28.9 pg (ref 26.0–34.0)
MCHC: 33.1 g/dL (ref 32.0–36.0)
MCV: 87.2 fL (ref 80.0–100.0)
Platelets: 561 10*3/uL — ABNORMAL HIGH (ref 150–440)
RBC: 3.21 MIL/uL — ABNORMAL LOW (ref 4.40–5.90)
RDW: 13.6 % (ref 11.5–14.5)
WBC: 21 10*3/uL — AB (ref 3.8–10.6)

## 2015-10-01 LAB — BASIC METABOLIC PANEL
ANION GAP: 4 — AB (ref 5–15)
BUN: 10 mg/dL (ref 6–20)
CALCIUM: 6.7 mg/dL — AB (ref 8.9–10.3)
CHLORIDE: 102 mmol/L (ref 101–111)
CO2: 24 mmol/L (ref 22–32)
Creatinine, Ser: 0.5 mg/dL — ABNORMAL LOW (ref 0.61–1.24)
GFR calc non Af Amer: >60 mL/min (ref >60)
GLUCOSE: 182 mg/dL — AB (ref 65–99)
POTASSIUM: 3.6 mmol/L (ref 3.5–5.1)
Sodium: 130 mmol/L — ABNORMAL LOW (ref 135–145)

## 2015-10-01 LAB — GLUCOSE, CAPILLARY
GLUCOSE-CAPILLARY: 147 mg/dL — AB (ref 65–99)
GLUCOSE-CAPILLARY: 149 mg/dL — AB (ref 65–99)
GLUCOSE-CAPILLARY: 216 mg/dL — AB (ref 65–99)
Glucose-Capillary: 163 mg/dL — ABNORMAL HIGH (ref 65–99)

## 2015-10-01 MED ORDER — DOCUSATE SODIUM 100 MG PO CAPS
200.0000 mg | ORAL_CAPSULE | Freq: Two times a day (BID) | ORAL | Status: DC
  Administered 2015-10-01 – 2015-10-08 (×16): 200 mg via ORAL
  Filled 2015-10-01 (×16): qty 2

## 2015-10-01 MED ORDER — VANCOMYCIN HCL IN DEXTROSE 1-5 GM/200ML-% IV SOLN
1000.0000 mg | Freq: Once | INTRAVENOUS | Status: AC
  Administered 2015-10-01: 1000 mg via INTRAVENOUS
  Filled 2015-10-01: qty 200

## 2015-10-01 MED ORDER — FUROSEMIDE 10 MG/ML IJ SOLN
20.0000 mg | Freq: Once | INTRAMUSCULAR | Status: AC
  Administered 2015-10-01: 12:00:00 20 mg via INTRAVENOUS
  Filled 2015-10-01: qty 2

## 2015-10-01 MED ORDER — VANCOMYCIN HCL IN DEXTROSE 750-5 MG/150ML-% IV SOLN
750.0000 mg | Freq: Three times a day (TID) | INTRAVENOUS | Status: DC
  Administered 2015-10-01 – 2015-10-02 (×4): 750 mg via INTRAVENOUS
  Filled 2015-10-01 (×7): qty 150

## 2015-10-01 MED ORDER — SENNA 8.6 MG PO TABS
1.0000 | ORAL_TABLET | Freq: Every day | ORAL | Status: DC
  Administered 2015-10-01 – 2015-10-08 (×8): 8.6 mg via ORAL
  Filled 2015-10-01 (×8): qty 1

## 2015-10-01 NOTE — Consult Note (Signed)
Pharmacy Antibiotic Note  Sean [name] is a 50 y.o. male admitted on 09/21/2015 with pneumonia.  Pharmacy has been consulted for vancomycin dosing.  Plan: Vancomycin 1g once then starting 6 hours later for stacked dosing, 750mg  q 8 hours Vancomycin 750 IV every 8 hours.  Goal trough 15-20 mcg/mL.  Will check trough at steady state 0415@ 1800   Height: 5\' 5"  (165.1 cm) Weight: 133 lb 13.1 oz (60.7 kg) IBW/kg (Calculated) : 61.5  Temp (24hrs), Avg:98.6 F (37 C), Min:98.2 F (36.8 C), Max:99 F (37.2 C)   Recent Labs Lab 09/26/15 0445 09/27/15 0456 09/28/15 1853 09/29/15 0449 10/01/15 0415  WBC 26.3* 25.5* 23.8* 18.3* 21.0*  CREATININE 0.68 0.70 0.65 0.65 0.50*    Estimated Creatinine Clearance: 95.9 mL/min (by C-G formula based on Cr of 0.5).    Allergies  Allergen Reactions  . Penicillins Other (See Comments)    Reaction:  Unknown     Antimicrobials this admission: vancomycin 4/4 >>4/4 4/14>> levofloxacin 4/4 >>  Metronidazole 4/5 >> 4/12  Dose adjustments this admission:   Microbiology results: 4/11 Tissue Culture: no growth 3 days 4/4 Blood cultures x 2: negative 4/4 UCx: NG, final 4/6 Pleural fluid: no growth  4/11 MRSA PCR: negative  Thank you for allowing pharmacy to be a part of this patient's care.  Olene FlossMelissa D Seth Higginbotham 10/01/2015 11:48 AM

## 2015-10-01 NOTE — Anesthesia Post-op Follow-up Note (Signed)
  Anesthesia Pain Follow-up Note  Patient: Sean [name]  Day #: 2  Date of Follow-up: 10/01/2015 Time: 7:35 AM  Last Vitals:  Filed Vitals:   10/01/15 0533 10/01/15 0626  BP: 136/67   Pulse: 95 92  Temp: 36.8 C   Resp: 17     Level of Consciousness: alert  Pain: mild   Side Effects:None  Catheter Site Exam: site not evaluated  Plan: Continue current therapy  Clydene PughBeane, Johnhenry Tippin D

## 2015-10-01 NOTE — Progress Notes (Signed)
50 yr old male POD#3 from Left thoracotomy and decortication for empyema.  Patient unable to communicate when family not in the room as speaks rare dialect.  Patient seems comfortable this Am.    Filed Vitals:   10/01/15 1041 10/01/15 1041  BP: 143/82 143/85  Pulse: 128 128  Temp:  98.8 F (37.1 C)  Resp:     I/O last 3 completed shifts: In: 4087.1 [P.O.:240; I.V.:3332.5; Other:214.6; IV Piggyback:300] Out: 1920 [Urine:1700; Drains:50; Chest Tube:170] Total I/O In: -  Out: 150 [Urine:150]   PE:  Gen: NAD, afebrile Res: mild crackles in bilateral bases, left chest incision site c/d/i with staples in place, no erythema or drainage, chest tubes in place with clean insertion sites, serous output, JP drain with serous drainage  A/P: 50 yr old male POD#3 from Left thoracotomy and decortication for empyema.  Patient doing well but increase in leukocytosis.  I discussed with Dr. Allena KatzPatel, he did have initial cultures on admission with many gram + cocci present but they did not continue to grow,never able to speiciate, cultures from OR still negative, since only on Levo, discussed broadening antibiotics since WBC increasing again; Added Vanc today.  CXR showing low lung volumes, but no ptx.

## 2015-10-01 NOTE — Progress Notes (Signed)
Inpatient Diabetes Program Recommendations  AACE/ADA: New Consensus Statement on Inpatient Glycemic Control (2015)  Target Ranges:  Prepandial:   less than 140 mg/dL      Peak postprandial:   less than 180 mg/dL (1-2 hours)      Critically ill patients:  140 - 180 mg/dL   Review of Glycemic Control  Results for Sean [name], Sean [name] (MRN [id number]) as of 10/01/2015 09:16  Ref. Range 09/30/2015 07:21 09/30/2015 11:46 09/30/2015 16:20 09/30/2015 21:11 10/01/2015 07:36  Glucose-Capillary Latest Ref Range: 65-99 mg/dL 782146 (H) 956189 (H) 213207 (H) 280 (H) 163 (H)    Diabetes history: Type 2 Outpatient Diabetes medications: Lantus 36 units qhs, Metformin 1000mg  bid Current orders for Inpatient glycemic control: Lantus 14 units qhs, Novolog 0-15 units tid, Novolog 0-5 units qhs  Inpatient Diabetes Program Recommendations:   Post prandial blood sugars remain elevated- consider adding 3 units of Novolog at meals (hold if he eats less than 50%) - continue Novolog correction insulin.  Susette RacerJulie Ratasha Fabre, RN, BA, MHA, CDE Diabetes Coordinator Inpatient Diabetes Program  [phone] (Team Pager) [phone] [facility] Office) 10/01/2015 9:18 AM

## 2015-10-01 NOTE — Progress Notes (Signed)
Physical Therapy Treatment Patient Details Name: Sean [name] MRN: [id number] DOB: 09-30-1965 Today's Date: 10/01/2015    History of Present Illness presented to ER 4/4 secondary to abdominal pain, cough and SOB; admitted with sepsis related to PNA and L pleural effusion/loculated empyema.  Hospital course significant for pleurx catheter placement (4/6) with revision/replacement (4/7) and subsequent L chest thoracotomy and decortication (4/11)    PT Comments    Patient able to increase activity level and gait distance without significant difficulty; continuing to require +2 for management of multiple lines/tubes.  Functional strength and balance good without buckling, LOB noted; tolerating activity without significant fatigue/SOB. Difficulty following commands/instruction for isolated therex; will receive maximal benefit from continued transfer and gait training in subsequent sessions.    Urdu interpreter (language line) utilized throughout session, ID [id number].  Brother also present throughout session to assist as needed.   Follow Up Recommendations  Home health PT;Supervision/Assistance - 24 hour     Equipment Recommendations  Rolling walker with 5" wheels    Recommendations for Other Services       Precautions / Restrictions Precautions Precautions: Fall Precaution Comments: L chest tube (x3), L chest JP drain, CT to water seal for ambulation (per Dr. Thelma Barge order), no lifting >15 lbs x4 weeks, pain epidural in place Restrictions Weight Bearing Restrictions: No    Mobility  Bed Mobility Overal bed mobility: Needs Assistance Bed Mobility: Supine to Sit     Supine to sit: Mod assist     General bed mobility comments: to intiate movement transition and truncal elevation  Transfers Overall transfer level: Needs assistance Equipment used: Rolling walker (2 wheeled) Transfers: Sit to/from Stand Sit to Stand: Min assist;+2 safety/equipment         General transfer  comment: cuing for hand placement  Ambulation/Gait Ambulation/Gait assistance: Min assist;+2 safety/equipment Ambulation Distance (Feet): 90 Feet Assistive device: Rolling walker (2 wheeled)       General Gait Details: broad BOS, short choppy steps; guarded trunk posturing with limited rotation and arm swing (likely due to pain), but no buckling or LOB.  Breathing slow and even without SOB/DOE noted; vitals stable and WFL.   Stairs            Wheelchair Mobility    Modified Rankin (Stroke Patients Only)       Balance Overall balance assessment: Needs assistance Sitting-balance support: No upper extremity supported;Feet supported Sitting balance-Leahy Scale: Good     Standing balance support: Bilateral upper extremity supported Standing balance-Leahy Scale: Fair                      Cognition Arousal/Alertness: Awake/alert Behavior During Therapy: WFL for tasks assessed/performed Overall Cognitive Status: Difficult to assess (history of MR at baseline; follows simple, one-step commands, enhanced with gestures and hand-over-hand)                      Exercises Other Exercises Other Exercises: Difficult for patient to follow commands for formal, isolated HEP; will benefit most from transfer and mobility training.    General Comments        Pertinent Vitals/Pain Pain Assessment: Faces Faces Pain Scale: Hurts little more Pain Location: L chest/flank Pain Descriptors / Indicators: Grimacing Pain Intervention(s): Limited activity within patient's tolerance;Monitored during session;Repositioned;Premedicated before session    Home Living                      Prior Function  PT Goals (current goals can now be found in the care plan section) Acute Rehab PT Goals PT Goal Formulation: Patient unable to participate in goal setting (patient eager for "discharge") Time For Goal Achievement: 10/13/15 Potential to Achieve Goals:  Good Progress towards PT goals: Progressing toward goals    Frequency  Min 2X/week    PT Plan Discharge plan needs to be updated    Co-evaluation             End of Session   Activity Tolerance: Patient tolerated treatment well Patient left: in chair;with call bell/phone within reach;with family/visitor present;with chair alarm set     Time: 1511-1530 PT Time Calculation (min) (ACUTE ONLY): 19 min  Charges:  $Gait Training: 8-22 mins                    G Codes:      Sean [name], PT, DPT, NCS 10/01/2015, 3:52 PM [phone]

## 2015-10-01 NOTE — Progress Notes (Signed)
Pt tried getting out of bed and pulled apart epidural catheter x2 today. Anesthesia notified, they came to fix it x2. Pt was asymptomatic.

## 2015-10-01 NOTE — Progress Notes (Signed)
Patient ID: Sean [name], male   DOB: 05-16-1966, 50 y.o.   MRN: [id number] [facility] [place] [facility]   PATIENT NAME: Sean [name]    MR#:  [id number]  DATE OF BIRTH:  05-16-1966  SUBJECTIVE:    c/o pain in abdomen, wbc higher today  REVIEW OF SYSTEMS:   Review of Systems  Constitutional: No fever  Negative for chills and weight loss.  HENT: Negative for ear discharge, ear pain and nosebleeds.   Eyes: Negative for blurred vision, pain and discharge.  Respiratory: Denies cough and no. Negative for sputum production, wheezing and stridor.   Cardiovascular: Negative for chest pain, palpitations, orthopnea and PND.  Gastrointestinal: Negative for nausea, vomiting,  Positive abdominal pain and diarrhea.  Genitourinary: Negative for urgency and frequency.  Musculoskeletal: Negative for back pain and joint pain.  Neurological: Positive weakness. Negative for sensory change, speech change and focal weakness.  Psychiatric/Behavioral: Negative for depression and hallucinations. The patient is not nervous/anxious.   All other systems reviewed and are negative.  Tolerating Diet:yes Tolerating PT pending  DRUG ALLERGIES:   Allergies  Allergen Reactions  . Penicillins Other (See Comments)    Reaction:  Unknown     VITALS:  Blood pressure 143/85, pulse 128, temperature 98.8 F (37.1 C), temperature source Oral, resp. rate 17, height 5\' 5"  (1.651 m), weight 60.7 kg (133 lb 13.1 oz), SpO2 97 %.  PHYSICAL EXAMINATION:   Physical Exam  GENERAL:  50 y.o.-year-old patient lying in the bed with no acute distress.  EYES: Pupils equal, round, reactive to light and accommodation. No scleral icterus. Extraocular muscles intact.  HEENT: Head atraumatic, normocephalic. Oropharynx and nasopharynx clear.  NECK:  Supple, no jugular venous distention. No thyroid enlargement, no tenderness.  LUNGS: decreased breath sounds left LL, no wheezing, no rales, no rhonchi. No  use of accessory muscles of respiration. CARDIOVASCULAR: S1, S2 normal. No murmurs, rubs, or gallops.  ABDOMEN: Soft,  Epigastric tenderness, nondistended. Bowel sounds present. No organomegaly or mass.  EXTREMITIES: No cyanosis, clubbing or edema b/l.    NEUROLOGIC: Cranial nerves II through XII are intact. No focal Motor or sensory deficits b/l.   PSYCHIATRIC:  patient is alert and oriented x 3.  SKIN: No obvious rash, lesion, or ulcer.   LABORATORY PANEL:  CBC  Recent Labs Lab 10/01/15 0415  WBC 21.0*  HGB 9.3*  HCT 28.0*  PLT 561*    Chemistries   Recent Labs Lab 09/29/15 0449 10/01/15 0415  NA 130* 130*  K 4.8 3.6  CL 102 102  CO2 24 24  GLUCOSE 270* 182*  BUN 17 10  CREATININE 0.65 0.50*  CALCIUM 6.7* 6.7*  AST 31  --   ALT 19  --   ALKPHOS 57  --   BILITOT 0.7  --    Cardiac Enzymes No results for input(s): TROPONINI in the last 168 hours. RADIOLOGY:  Dg Chest 1 View  10/01/2015  CLINICAL DATA:  Evaluate post thoracotomy EXAM: CHEST 1 VIEW COMPARISON:  09/29/2015 FINDINGS: Left chest tubes remain in place. No pneumothorax. Very low lung volumes with right basilar atelectasis and patchy opacities throughout the left lung, stable since prior study. Heart is borderline in size. IMPRESSION: Very low lung volumes with diffuse left lung and right basilar airspace opacities, likely atelectasis. Left chest tubes without pneumothorax. Note real change since prior study. Electronically Signed   By: Charlett NoseKevin  Dover M.D.   On: 10/01/2015 08:55   ASSESSMENT AND PLAN:  Sean [name] is a 50 y.o. male with a known history of Hypertension and diabetes and mental retardation. With complicated pneumonia and loculated empyema  1.Sepsis with  left LL pneumonia and pleural effusion/loculated empyema ,  patient's status post decortication and chest tube in place WBC elevated I have discussed case with surgery. We will start the patient on vancomycin due to persistently elevated WBC  count No growth from the cultures from the operating room  2.Lactic acidosis. Due to sepsis with left lower lobe pneumonia -Resolved   3.Elevated troponin.  Due to demand ischemia CT of the chest suggestive of underlying coronary artery disease,  Out patient stress test   4.Hyponatremia. 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   5.Diabetes. Blood glucosimproved with changes in Lantus dose   6. Hypertension.  Continue metoprolol bid  7. Abdominal pain we'll try laxatives since he hasn't had a bowel movement. If symptoms persist will consider obtaining a CT of abdomen   Case discussed with Care Management/Social Worker. Management plans discussed with the patient, family and they are in agreement.  CODE STATUS: full  DVT Prophylaxis: lovenox  TOTAL TIME TAKING CARE OF THIS PATIENT:94minutes.      Note: This dictation was prepared with Dragon dictation along with smaller phrase technology. Any transcriptional errors that result from this process are unintentional.  Auburn Bilberry M.D on 10/01/2015 at 12:07 PM  Between 7am to 6pm - Pager - [phone]  After 6pm go to [email or web address] - password [email or web address] [facility]  [facility]  Office  [phone]  CC: Primary care physician; Emogene Morgan, MD

## 2015-10-01 NOTE — Anesthesia Post-op Follow-up Note (Cosign Needed)
  Anesthesia Pain Follow-up Note  Patient: Sean [name]  Day #: 3  Date of Follow-up: 10/01/2015 Time: 7:41 AM  Last Vitals:  Filed Vitals:   10/01/15 0533 10/01/15 0626  BP: 136/67   Pulse: 95 92  Temp: 36.8 C   Resp: 17     Level of Consciousness: alert  Pain: mild   Side Effects:None  Catheter Site Exam:clean, dry, no drainage  Plan: Continue current therapy  Rica MastBachich,  Shanieka Blea M

## 2015-10-01 NOTE — Progress Notes (Signed)
Initial Nutrition Assessment   INTERVENTION:   -Cater to pt preferences on Carb Modified diet order -Will recommend supplement on follow if intake inadequate -Will follow poc regarding bowel regimen as pt without BM since admission   NUTRITION DIAGNOSIS:   Altered GI function related to acute illness as evidenced by  (constipation, no BM since admission).  GOAL:   Patient will meet greater than or equal to 90% of their needs  MONITOR:   PO intake, Labs, Weight trends, I & O's  REASON FOR ASSESSMENT:   LOS    ASSESSMENT:   Pt admitted with Sepsis with left LL pneumonia and pleural effusion/loculated empyema, patient's status post decortication and chest tube in place. Pt speaks rare dialect and h/o MR per chart review. Pt unavailable times two today.  Past Medical History  Diagnosis Date  . Diabetes mellitus without complication (HCC)   . Hypertension   . Mentally disabled      Diet Order:  Diet Carb Modified Fluid consistency:: Thin; Room service appropriate?: Yes Diet general    Current Nutrition: Per RN Meghan pt eating very well this am, 100% recorded.   Food/Nutrition-Related History: Per I/O chart pt eating 80-100% of meals since 09/24/15.   Medications: Dulcolax, Fentanyl, Senokot, Lantus  Labs: Na 130, Glucose 182, Alb 1.8  Gastrointestinal Profile: Last BM: no BM since admission   Nutrition-Focused Physical Exam Findings:  Unable to complete Nutrition-Focused physical exam at this time.    Weight Change:  Filed Weights   09/21/15 1306 09/28/15 1222 09/28/15 2000  Weight: 125 lb 10.6 oz (57 kg) 125 lb (56.7 kg) 133 lb 13.1 oz (60.7 kg)     Skin:  Reviewed, no issues   BMI:  Body mass index is 22.27 kg/(m^2).  Estimated Nutritional Needs:   Kcal:  1850-2170kcals  Protein:  61-73g protein  Fluid:  >1.9L fluid  EDUCATION NEEDS:   No education needs identified at this time  Leda QuailAllyson Alithea Lapage, RD, LDN Pager [phone]  [phone] Weekend/On-Call Pager [phone]

## 2015-10-02 ENCOUNTER — Inpatient Hospital Stay: Payer: Medicaid Other

## 2015-10-02 LAB — CBC
HCT: 28.2 % — ABNORMAL LOW (ref 40.0–52.0)
Hemoglobin: 9.4 g/dL — ABNORMAL LOW (ref 13.0–18.0)
MCH: 29.4 pg (ref 26.0–34.0)
MCHC: 33.4 g/dL (ref 32.0–36.0)
MCV: 88 fL (ref 80.0–100.0)
PLATELETS: 614 10*3/uL — AB (ref 150–440)
RBC: 3.21 MIL/uL — AB (ref 4.40–5.90)
RDW: 13.6 % (ref 11.5–14.5)
WBC: 17.8 10*3/uL — AB (ref 3.8–10.6)

## 2015-10-02 LAB — GLUCOSE, CAPILLARY
GLUCOSE-CAPILLARY: 145 mg/dL — AB (ref 65–99)
GLUCOSE-CAPILLARY: 207 mg/dL — AB (ref 65–99)
Glucose-Capillary: 207 mg/dL — ABNORMAL HIGH (ref 65–99)
Glucose-Capillary: 184 mg/dL — ABNORMAL HIGH (ref 65–99)

## 2015-10-02 LAB — BASIC METABOLIC PANEL
Anion gap: 5 (ref 5–15)
BUN: 10 mg/dL (ref 6–20)
CALCIUM: 6.7 mg/dL — AB (ref 8.9–10.3)
CO2: 25 mmol/L (ref 22–32)
CREATININE: 0.45 mg/dL — AB (ref 0.61–1.24)
Chloride: 97 mmol/L — ABNORMAL LOW (ref 101–111)
Glucose, Bld: 153 mg/dL — ABNORMAL HIGH (ref 65–99)
Potassium: 3.3 mmol/L — ABNORMAL LOW (ref 3.5–5.1)
SODIUM: 127 mmol/L — AB (ref 135–145)

## 2015-10-02 LAB — VANCOMYCIN, TROUGH: VANCOMYCIN TR: 14 ug/mL (ref 10–20)

## 2015-10-02 MED ORDER — LACTULOSE 10 GM/15ML PO SOLN
30.0000 g | Freq: Two times a day (BID) | ORAL | Status: DC
  Administered 2015-10-02 – 2015-10-03 (×3): 30 g via ORAL
  Filled 2015-10-02 (×3): qty 60

## 2015-10-02 MED ORDER — MORPHINE SULFATE (PF) 2 MG/ML IV SOLN
1.0000 mg | INTRAVENOUS | Status: DC | PRN
  Administered 2015-10-02 (×2): 1 mg via INTRAVENOUS
  Filled 2015-10-02 (×2): qty 1

## 2015-10-02 MED ORDER — ENOXAPARIN SODIUM 40 MG/0.4ML ~~LOC~~ SOLN
40.0000 mg | SUBCUTANEOUS | Status: DC
  Administered 2015-10-02 – 2015-10-08 (×7): 40 mg via SUBCUTANEOUS
  Filled 2015-10-02 (×7): qty 0.4

## 2015-10-02 MED ORDER — POTASSIUM CHLORIDE CRYS ER 20 MEQ PO TBCR
20.0000 meq | EXTENDED_RELEASE_TABLET | Freq: Two times a day (BID) | ORAL | Status: DC
  Administered 2015-10-02 – 2015-10-05 (×7): 20 meq via ORAL
  Filled 2015-10-02 (×7): qty 1

## 2015-10-02 MED ORDER — VANCOMYCIN HCL IN DEXTROSE 1-5 GM/200ML-% IV SOLN
1000.0000 mg | Freq: Three times a day (TID) | INTRAVENOUS | Status: DC
Start: 2015-10-02 — End: 2015-10-04
  Administered 2015-10-03 – 2015-10-04 (×5): 1000 mg via INTRAVENOUS
  Filled 2015-10-02 (×9): qty 200

## 2015-10-02 NOTE — Progress Notes (Signed)
Notified Dr. Cherlynn KaiserSainani of potassium serum of 3.3 and sodium serum of 127, patient no longer has epidural and only has oxycodone po q6h. MD acknowledged and will enter orders as appropriate. Verbal in person.

## 2015-10-02 NOTE — Progress Notes (Signed)
[employer] - [place] [facility]   PATIENT NAME: Sean [name]    MR#:  [id number]  DATE OF BIRTH:  10-06-1965  SUBJECTIVE:   No acute events overnight. Patient is complaining of some abdominal pain. His epidural has been removed. Still has a left-sided chest tube. Afebrile.  REVIEW OF SYSTEMS:    Review of Systems  Constitutional: Negative for fever and chills.  HENT: Negative for congestion and tinnitus.   Eyes: Negative for blurred vision and double vision.  Respiratory: Negative for cough, shortness of breath and wheezing.   Cardiovascular: Negative for chest pain, orthopnea and PND.  Gastrointestinal: Positive for abdominal pain. Negative for nausea, vomiting and diarrhea.  Genitourinary: Negative for dysuria and hematuria.  Neurological: Negative for dizziness, sensory change and focal weakness.  All other systems reviewed and are negative.   Nutrition: Carb control Tolerating Diet: Yes Tolerating PT: Evaluation noted      DRUG ALLERGIES:   Allergies  Allergen Reactions  . Penicillins Other (See Comments)    Reaction:  Unknown     VITALS:  Blood pressure 146/63, pulse 90, temperature 98.7 F (37.1 C), temperature source Oral, resp. rate 16, height 5\' 5"  (1.651 m), weight 60.7 kg (133 lb 13.1 oz), SpO2 93 %.  PHYSICAL EXAMINATION:   Physical Exam  GENERAL:  50 y.o.-year-old patient lying in the bed in no acute distress.  EYES: Pupils equal, round, reactive to light and accommodation. No scleral icterus. Extraocular muscles intact.  HEENT: Head atraumatic, normocephalic. Oropharynx and nasopharynx clear.  NECK:  Supple, no jugular venous distention. No thyroid enlargement, no tenderness.  LUNGS: Normal breath sounds bilaterally, no wheezing, rales, rhonchi. No use of accessory muscles of respiration. Left-sided chest tube in place. CARDIOVASCULAR: S1, S2 normal. No murmurs, rubs, or gallops.  ABDOMEN: Soft, nontender, nondistended. Bowel  sounds present. No organomegaly or mass.  EXTREMITIES: No cyanosis, clubbing or edema b/l.    NEUROLOGIC: Cranial nerves II through XII are intact. No focal Motor or sensory deficits b/l.  Globally weak PSYCHIATRIC: The patient is alert and oriented x 1.  SKIN: No obvious rash, lesion, or ulcer.    LABORATORY PANEL:   CBC  Recent Labs Lab 10/02/15 0628  WBC 17.8*  HGB 9.4*  HCT 28.2*  PLT 614*   ------------------------------------------------------------------------------------------------------------------  Chemistries   Recent Labs Lab 09/29/15 0449  10/02/15 0628  NA 130*  < > 127*  K 4.8  < > 3.3*  CL 102  < > 97*  CO2 24  < > 25  GLUCOSE 270*  < > 153*  BUN 17  < > 10  CREATININE 0.65  < > 0.45*  CALCIUM 6.7*  < > 6.7*  AST 31  --   --   ALT 19  --   --   ALKPHOS 57  --   --   BILITOT 0.7  --   --   < > = values in this interval not displayed. ------------------------------------------------------------------------------------------------------------------  Cardiac Enzymes No results for input(s): TROPONINI in the last 168 hours. ------------------------------------------------------------------------------------------------------------------  RADIOLOGY:  Dg Chest 1 View  10/01/2015  CLINICAL DATA:  Evaluate post thoracotomy EXAM: CHEST 1 VIEW COMPARISON:  09/29/2015 FINDINGS: Left chest tubes remain in place. No pneumothorax. Very low lung volumes with right basilar atelectasis and patchy opacities throughout the left lung, stable since prior study. Heart is borderline in size. IMPRESSION: Very low lung volumes with diffuse left lung and right basilar airspace opacities, likely atelectasis. Left chest tubes  without pneumothorax. Note real change since prior study. Electronically Signed   By: Charlett Nose M.D.   On: 10/01/2015 08:55     ASSESSMENT AND PLAN:   50 year old male with past medical history of hypertension, diabetes, mental retardation who  presented to the hospital with pneumonia and empyema.  1. Sepsis-this was secondary to left lower lobe pneumonia with loculated effusion/empyema. -Patient is status post decortication and chest tube placement. His white cell count is improving. -Afebrile, hemodynamically stable. -Cultures so far negative. Continue empiric vancomycin, Levaquin  2. Pneumonia with empyema-patient is status post left-sided thoracotomy and decortication. Postop day #4. -Appreciate surgical input. Continue empiric antibiotics with vancomycin, Levaquin. Cultures so far negative.  3. Lactic acidosis-due to sepsis and pneumonia. Now resolved.  4. Hyponatremia-mildly hypovolemic versus SIADH due to his empyema and pneumonia. -Sodium stable presently. Will monitor. Asymptomatic presently.  5. Hypokalemia-I will place the patient on potassium supplements and follow level.  6. Diabetes type 2 without complication-continue Lantus, sliding scale insulin. Follow blood sugars.  7. Constipation-start the patient on some oral lactulose. -Continue Senokot.    All the records are reviewed and case discussed with Care Management/Social Workerr. Management plans discussed with the patient, family and they are in agreement.  CODE STATUS: Full  DVT Prophylaxis: Lovenox  TOTAL TIME TAKING CARE OF THIS PATIENT: 30 minutes.   POSSIBLE D/C IN 2-3 DAYS, DEPENDING ON CLINICAL CONDITION.   Houston Siren M.D on 10/02/2015 at 11:37 AM  Between 7am to 6pm - Pager - [phone]  After 6pm go to [email or web address] - password [email or web address] [facility]  [facility]  Office  [phone]  CC: Primary care physician; Emogene Morgan, MD

## 2015-10-02 NOTE — Progress Notes (Addendum)
Pharmacy Antibiotic Note  Sean [name] is a 50 y.o. male admitted on 09/21/2015 with sepsis.  Pharmacy has been consulted for Vancomycin dosing.  Plan: 4/15 :  VT @ 18:00 = 14 mcg/mL .  Will increase dose to Vanc 1 gm IV Q8H to start 4/15 @ 19:00.  Will recheck VT on 4/16 @ 1830.  Height: 5\' 5"  (165.1 cm) Weight: 133 lb 13.1 oz (60.7 kg) IBW/kg (Calculated) : 61.5  Temp (24hrs), Avg:98.6 F (37 C), Min:98.3 F (36.8 C), Max:98.7 F (37.1 C)   Recent Labs Lab 09/27/15 0456 09/28/15 1853 09/29/15 0449 10/01/15 0415 10/02/15 0628 10/02/15 1758  WBC 25.5* 23.8* 18.3* 21.0* 17.8*  --   CREATININE 0.70 0.65 0.65 0.50* 0.45*  --   VANCOTROUGH  --   --   --   --   --  14    Estimated Creatinine Clearance: 95.9 mL/min (by C-G formula based on Cr of 0.45).    Allergies  Allergen Reactions  . Penicillins Other (See Comments)    Reaction:  Unknown     Antimicrobials this admission:   >>    >>   Dose adjustments this admission: 4/15:  Vanc trough @ 18:00 = 14 mcg/mL .    Dose increased from Vanc 750 mg IV Q8H to Vanc 1 gm IV Q8H.   Microbiology results:  BCx:   UCx:    Sputum:    MRSA PCR:   Thank you for allowing pharmacy to be a part of this patient's care.  Ondria Oswald D 10/02/2015 6:52 PM

## 2015-10-02 NOTE — Progress Notes (Signed)
50 yr old male POD#4 from Left thoracotomy and decortication for empyema.  Patient unable to communicate when family not in the room as speaks rare dialect.  Patient doing well this AM, no acute distress.    Filed Vitals:   10/02/15 0530 10/02/15 1257  BP: 146/63 117/68  Pulse: 90 89  Temp: 98.7 F (37.1 C) 98.7 F (37.1 C)  Resp: 16 20   I/O last 3 completed shifts: In: 2081 [Other:81] Out: 2020 [Urine:1750; Drains:70; Chest Tube:200] Total I/O In: 390 [P.O.:240; IV Piggyback:150] Out: 750 [Urine:750]   PE:  Gen: NAD, afebrile Res: mild crackles in bilateral bases, left chest incision site c/d/i with staples in place, no erythema or drainage, chest tubes in place with clean insertion sites, serous output, JP drain with serous drainage  A/P: 50 yr old male POD#4 from Left thoracotomy and decortication for empyema.  WBC coming down the Vanc and Levo.  Decreasing CT output, will get CXR in AM.

## 2015-10-02 NOTE — Anesthesia Post-op Follow-up Note (Signed)
  Anesthesia Pain Follow-up Note  Patient: [name]  Day #: 4  Date of Follow-up: 10/02/2015 Time: 8:12 AM  Last Vitals:  Filed Vitals:   10/01/15 2110 10/02/15 0530  BP: 120/70 146/63  Pulse: 107 90  Temp: 36.8 C 37.1 C  Resp: 16 16    Level of Consciousness: alert  Pain: mild   Side Effects:None  Catheter Site Exam: Clean, Dry, non erythematous.  Epidural catheter is no longer in patients body, Tegaderm has been removed and catheter is on patients bed, tip intact, assume that this happened overnight.  Plan: Catheter removed/tip intact and D/C Infusion  Cleda MccreedyJoseph K Piscitello

## 2015-10-03 LAB — BASIC METABOLIC PANEL WITH GFR
Anion gap: 3 — ABNORMAL LOW (ref 5–15)
BUN: 7 mg/dL (ref 6–20)
CO2: 28 mmol/L (ref 22–32)
Calcium: 7.1 mg/dL — ABNORMAL LOW (ref 8.9–10.3)
Chloride: 101 mmol/L (ref 101–111)
Creatinine, Ser: 0.49 mg/dL — ABNORMAL LOW (ref 0.61–1.24)
GFR calc Af Amer: >60 mL/min
GFR calc non Af Amer: >60 mL/min
Glucose, Bld: 100 mg/dL — ABNORMAL HIGH (ref 65–99)
Potassium: 3.1 mmol/L — ABNORMAL LOW (ref 3.5–5.1)
Sodium: 132 mmol/L — ABNORMAL LOW (ref 135–145)

## 2015-10-03 LAB — TISSUE CULTURE: CULTURE: NO GROWTH

## 2015-10-03 LAB — CBC
HCT: 29.7 % — ABNORMAL LOW (ref 40.0–52.0)
Hemoglobin: 10 g/dL — ABNORMAL LOW (ref 13.0–18.0)
MCH: 29.2 pg (ref 26.0–34.0)
MCHC: 33.8 g/dL (ref 32.0–36.0)
MCV: 86.5 fL (ref 80.0–100.0)
Platelets: 643 K/uL — ABNORMAL HIGH (ref 150–440)
RBC: 3.43 MIL/uL — ABNORMAL LOW (ref 4.40–5.90)
RDW: 13.5 % (ref 11.5–14.5)
WBC: 13.2 K/uL — ABNORMAL HIGH (ref 3.8–10.6)

## 2015-10-03 LAB — GLUCOSE, CAPILLARY
Glucose-Capillary: 78 mg/dL (ref 65–99)
Glucose-Capillary: 178 mg/dL — ABNORMAL HIGH (ref 65–99)
Glucose-Capillary: 172 mg/dL — ABNORMAL HIGH (ref 65–99)
Glucose-Capillary: 164 mg/dL — ABNORMAL HIGH (ref 65–99)

## 2015-10-03 LAB — VANCOMYCIN, TROUGH: Vancomycin Tr: 15 ug/mL (ref 10–20)

## 2015-10-03 NOTE — Progress Notes (Signed)
Pharmacy Antibiotic Note  Sean [name] is a 50 y.o. male admitted on 09/21/2015 with sepsis.  Pharmacy has been consulted for Vancomycin dosing.  Plan: 4/15 :  VT @ 18:00 = 14 mcg/mL .  Will increase dose to Vanc 1 gm IV Q8H to start 4/15 @ 19:00.  Will recheck VT on 4/16 @ 1830.  4/16: VT @ 18:30 = 15 mcg/mL.  Will continue this pt on current dose of Vanc 1 gm IV Q8H.   Height: 5\' 5"  (165.1 cm) Weight: 133 lb 13.1 oz (60.7 kg) IBW/kg (Calculated) : 61.5  Temp (24hrs), Avg:98.6 F (37 C), Min:98.5 F (36.9 C), Max:98.7 F (37.1 C)   Recent Labs Lab 09/28/15 1853 09/29/15 0449 10/01/15 0415 10/02/15 0628 10/02/15 1758 10/03/15 0553 10/03/15 1823  WBC 23.8* 18.3* 21.0* 17.8*  --  13.2*  --   CREATININE 0.65 0.65 0.50* 0.45*  --  0.49*  --   VANCOTROUGH  --   --   --   --  14  --  15    Estimated Creatinine Clearance: 95.9 mL/min (by C-G formula based on Cr of 0.49).    Allergies  Allergen Reactions  . Penicillins Other (See Comments)    Reaction:  Unknown     Antimicrobials this admission:   >>    >>   Dose adjustments this admission: 4/15:  Vanc trough @ 18:00 = 14 mcg/mL .    Dose increased from Vanc 750 mg IV Q8H to Vanc 1 gm IV Q8H.   Microbiology results:  BCx:   UCx:    Sputum:    MRSA PCR:   Thank you for allowing pharmacy to be a part of this patient's care.  Pheonix Clinkscale D 10/03/2015 6:51 PM

## 2015-10-03 NOTE — Progress Notes (Signed)
50 yr old male POD#5 from Left thoracotomy and decortication for empyema.  Patient unable to communicate when family not in the room as speaks rare dialect.  Patient seems to be doing well.     Filed Vitals:   10/03/15 1025 10/03/15 1458  BP: 164/72 149/66  Pulse: 95 88  Temp:  98.5 F (36.9 C)  Resp:     I/O last 3 completed shifts: In: 930 [P.O.:480; IV Piggyback:450] Out: 2775 [Urine:2500; Drains:85; Chest Tube:190] Total I/O In: -  Out: 1105 [Urine:1025; Drains:30; Chest Tube:50]   PE:  Gen: NAD, afebrile Res: mild crackles in bilateral bases, left chest incision site c/d/i with staples in place, no erythema or drainage, chest tubes in place with clean insertion sites, serous output, JP drain with serous drainage  A/P: 50 yr old male POD#5 from Left thoracotomy and decortication for empyema.  WBC coming down the Vanc and Levo.  Decreasing CT output, will take off suction and separate into separate pleurovacs.

## 2015-10-03 NOTE — Progress Notes (Signed)
[employer] - [place] [facility]   PATIENT NAME: Sean [name]    MR#:  [id number]  DATE OF BIRTH:  [birth year]  SUBJECTIVE:   Chest tube still in place. X-ray from yesterday shows improvement in the size of the effusion. No evidence of pneumothorax. Patient still complaining of pain in the chest tube site.  REVIEW OF SYSTEMS:    Review of Systems  Constitutional: Negative for fever and chills.  HENT: Negative for congestion and tinnitus.   Eyes: Negative for blurred vision and double vision.  Respiratory: Negative for cough, shortness of breath and wheezing.   Cardiovascular: Negative for chest pain, orthopnea and PND.  Gastrointestinal: Negative for nausea, vomiting, abdominal pain and diarrhea.  Genitourinary: Negative for dysuria and hematuria.  Neurological: Negative for dizziness, sensory change and focal weakness.  All other systems reviewed and are negative.   Nutrition: Carb control Tolerating Diet: Yes Tolerating PT: Evaluation noted      DRUG ALLERGIES:   Allergies  Allergen Reactions  . Penicillins Other (See Comments)    Reaction:  Unknown     VITALS:  Blood pressure 164/72, pulse 95, temperature 98.7 F (37.1 C), temperature source Oral, resp. rate 20, height 5\' 5"  (1.651 m), weight 60.7 kg (133 lb 13.1 oz), SpO2 95 %.  PHYSICAL EXAMINATION:   Physical Exam  GENERAL:  50 y.o.-year-old patient lying in the bed in no acute distress.  EYES: Pupils equal, round, reactive to light and accommodation. No scleral icterus. Extraocular muscles intact.  HEENT: Head atraumatic, normocephalic. Oropharynx and nasopharynx clear.  NECK:  Supple, no jugular venous distention. No thyroid enlargement, no tenderness.  LUNGS: Normal breath sounds bilaterally, no wheezing, rales, rhonchi. No use of accessory muscles of respiration. Left-sided chest tube in place. CARDIOVASCULAR: S1, S2 normal. No murmurs, rubs, or gallops.  ABDOMEN: Soft, nontender,  nondistended. Bowel sounds present. No organomegaly or mass.  EXTREMITIES: No cyanosis, clubbing or edema b/l.    NEUROLOGIC: Cranial nerves II through XII are intact. No focal Motor or sensory deficits b/l.  Globally weak PSYCHIATRIC: The patient is alert and oriented x 1.  SKIN: No obvious rash, lesion, or ulcer.    LABORATORY PANEL:   CBC  Recent Labs Lab 10/03/15 0553  WBC 13.2*  HGB 10.0*  HCT 29.7*  PLT 643*   ------------------------------------------------------------------------------------------------------------------  Chemistries   Recent Labs Lab 09/29/15 0449  10/03/15 0553  NA 130*  < > 132*  K 4.8  < > 3.1*  CL 102  < > 101  CO2 24  < > 28  GLUCOSE 270*  < > 100*  BUN 17  < > 7  CREATININE 0.65  < > 0.49*  CALCIUM 6.7*  < > 7.1*  AST 31  --   --   ALT 19  --   --   ALKPHOS 57  --   --   BILITOT 0.7  --   --   < > = values in this interval not displayed. ------------------------------------------------------------------------------------------------------------------  Cardiac Enzymes No results for input(s): TROPONINI in the last 168 hours. ------------------------------------------------------------------------------------------------------------------  RADIOLOGY:  Dg Chest 2 View  10/02/2015  CLINICAL DATA:  F/u post op day 4 left thoracotomy, left chest tube EXAM: CHEST  2 VIEW COMPARISON:  10/01/2015 FINDINGS: Motion and position degraded lateral view. 3 left-sided chest tubes are similar. Midline trachea. Normal heart size. Small volume left-sided pleural fluid, improved. No pneumothorax. Improved bibasilar airspace disease, worse on the left. IMPRESSION: Left-sided chest tubes remaining  in place with decreased pleural fluid and no pneumothorax. Improved bibasilar Airspace disease, likely atelectasis. Electronically Signed   By: Jeronimo Greaves M.D.   On: 10/02/2015 16:04     ASSESSMENT AND PLAN:   50 year old male with past medical history of  hypertension, diabetes, mental retardation who presented to the hospital with pneumonia and empyema.  1. Sepsis-this was secondary to left lower lobe pneumonia with loculated effusion/empyema. -Patient is status post decortication and chest tube placement. WBC count trending down now.  -Afebrile, hemodynamically stable. -Cultures so far negative. Continue empiric vancomycin, Levaquin  2. Pneumonia with empyema-patient is status post left-sided thoracotomy and decortication. Postop day #5. -Appreciate surgical input. Continue empiric antibiotics with vancomycin, Levaquin. Cultures so far negative and WBC count trending down.  - CXR from 4/15 showing improvement in effusion and no pneumothorax. Will discuss w/ Dr. Thelma Barge about when the tubes can be removed. Drainage has improved.  3. Lactic acidosis-due to sepsis and pneumonia. Now resolved.  4. Hyponatremia-mildly hypovolemic versus SIADH due to his empyema and pneumonia. -Sodium improved. Will monitor. Asymptomatic presently.  5. Hypokalemia- cont. Oral supplements and will monitor.   6. Diabetes type 2 without complication-continue Lantus, sliding scale insulin. Follow blood sugars.  7. Constipation-improved and resolved w/ lactulose as pt. Had 2-3 BM's yesterday - cont. Senna  Spoke with pt's Brother in the hallway and updated him on pt's care. Pt's brother adamantly wants [place] to keep patient in the hospital until his weakness has improve and he is able to do some of his ADL's as there is noone at home to take care of him.  Will need to discuss w/ CM tomorrow.   All the records are reviewed and case discussed with Care Management/Social Workerr. Management plans discussed with the patient, family and they are in agreement.  CODE STATUS: Full  DVT Prophylaxis: Lovenox  TOTAL TIME TAKING CARE OF THIS PATIENT: 30 minutes.   POSSIBLE D/C IN 2-3 DAYS, DEPENDING ON CLINICAL CONDITION.   Houston Siren M.D on 10/03/2015 at 12:39  PM  Between 7am to 6pm - Pager - [phone]  After 6pm go to [email or web address] - password [email or web address] [facility]  [facility]  Office  [phone]  CC: Primary care physician; Emogene Morgan, MD

## 2015-10-03 NOTE — Progress Notes (Signed)
Spoke with Dr. Orvis BrillLoflin, she is requesting that each chest tube in separated to drain into it's own plural vac and drain to water seal.  Suction turned off. As soon as drains system are received from supplies with change.  Orson Apeanielle Kayla Deshaies, RN

## 2015-10-04 LAB — GLUCOSE, CAPILLARY
GLUCOSE-CAPILLARY: 182 mg/dL — AB (ref 65–99)
GLUCOSE-CAPILLARY: 160 mg/dL — AB (ref 65–99)
Glucose-Capillary: 155 mg/dL — ABNORMAL HIGH (ref 65–99)
Glucose-Capillary: 259 mg/dL — ABNORMAL HIGH (ref 65–99)

## 2015-10-04 LAB — VANCOMYCIN, TROUGH
VANCOMYCIN TR: 24 ug/mL — AB (ref 10–20)
Vancomycin Tr: 16 ug/mL (ref 10–20)

## 2015-10-04 MED ORDER — VANCOMYCIN HCL IN DEXTROSE 1-5 GM/200ML-% IV SOLN
1000.0000 mg | Freq: Two times a day (BID) | INTRAVENOUS | Status: DC
  Filled 2015-10-04 (×2): qty 200

## 2015-10-04 MED ORDER — VANCOMYCIN HCL IN DEXTROSE 1-5 GM/200ML-% IV SOLN
1000.0000 mg | Freq: Two times a day (BID) | INTRAVENOUS | Status: DC
  Administered 2015-10-05 – 2015-10-06 (×4): 1000 mg via INTRAVENOUS
  Filled 2015-10-04 (×6): qty 200

## 2015-10-04 MED ORDER — GLUCERNA SHAKE PO LIQD
237.0000 mL | Freq: Three times a day (TID) | ORAL | Status: DC
  Administered 2015-10-04 – 2015-10-09 (×9): 237 mL via ORAL

## 2015-10-04 MED ORDER — GLUCERNA SHAKE PO LIQD: 237.0000 mL | Freq: Three times a day (TID) | ORAL | Status: DC

## 2015-10-04 MED ORDER — GUAIFENESIN-DM 100-10 MG/5ML PO SYRP
5.0000 mL | ORAL_SOLUTION | ORAL | Status: DC | PRN
  Administered 2015-10-04: 5 mL via ORAL
  Filled 2015-10-04: qty 5

## 2015-10-04 MED ORDER — ACETAMINOPHEN 325 MG PO TABS: 650.0000 mg | ORAL_TABLET | Freq: Four times a day (QID) | ORAL | Status: DC | PRN

## 2015-10-04 NOTE — Plan of Care (Signed)
Problem: Nutrition: Goal: Adequate nutrition will be maintained Outcome: Not Progressing Eating small amt of meals  No swallow difficulty pt hav n/v this pm.  zofran given. Resting quietly at present  Problem: Activity: Goal: Ability to tolerate increased activity will improve Outcome: Progressing Chair today  Problem: Pain Management: Goal: Expressions of feelings of enhanced comfort will increase Outcome: Progressing Denies pain

## 2015-10-04 NOTE — Progress Notes (Signed)
Pharmacy Antibiotic Note  Sean [name] is a 50 y.o. male admitted on 09/21/2015 with sepsis.  Pharmacy has been consulted for Vancomycin dosing.  Plan: 4/15 :  VT @ 18:00 = 14 mcg/mL .  Will increase dose to Vanc 1 gm IV Q8H to start 4/15 @ 19:00.  Will recheck VT on 4/16 @ 1830.  Height: 5\' 5"  (165.1 cm) Weight: 133 lb 13.1 oz (60.7 kg) IBW/kg (Calculated) : 61.5  Temp (24hrs), Avg:98.2 F (36.8 C), Min:97.6 F (36.4 C), Max:98.9 F (37.2 C)   Recent Labs Lab 09/28/15 1853 09/29/15 0449 10/01/15 0415 10/02/15 0628  10/03/15 0553 10/03/15 1823 10/04/15 1825  WBC 23.8* 18.3* 21.0* 17.8*  --  13.2*  --   --   CREATININE 0.65 0.65 0.50* 0.45*  --  0.49*  --   --   VANCOTROUGH  --   --   --   --   < >  --  15 24*  < > = values in this interval not displayed.  Estimated Creatinine Clearance: 95.9 mL/min (by C-G formula based on Cr of 0.49).    Allergies  Allergen Reactions  . Penicillins Other (See Comments)    Reaction:  Unknown     Antimicrobials this admission:   >>    >>   Dose adjustments this admission: 4/15:  Vanc trough @ 18:00 = 14 mcg/mL .    Dose increased from Vanc 750 mg IV Q8H to Vanc 1 gm IV Q8H.   4/17 at 18:25 Trough = 24. Will recheck level at 22:30.   Microbiology results:  BCx:   UCx:    Sputum:    MRSA PCR:   Thank you for allowing pharmacy to be a part of this patient's care.  Giada Schoppe K 10/04/2015 10:16 PM

## 2015-10-04 NOTE — Progress Notes (Addendum)
Pharmacy Antibiotic Note  Sean [name] is a 50 y.o. male admitted on 09/21/2015 with sepsis.  Pharmacy has been consulted for Vancomycin dosing and levaquin dosing.  Plan: 4/15 :  VT @ 18:17 = 14 mcg/mL .  Will increase dose to Vanc 1 gm IV Q8H to start 4/15 @ 19:00.  Will recheck VT on 4/16 @ 1830.  4/16: VT @ 18:30 = 15 mcg/mL.  Will continue this pt on current dose of Vanc 1 gm IV Q8H.   Previous level was not at steady state. Will order another level for 4/17 at 1830, which should be at steady state.   Will continue levofloxacin 750 mg IV q24h.   Height: 5\' 5"  (165.1 cm) Weight: 133 lb 13.1 oz (60.7 kg) IBW/kg (Calculated) : 61.5  Temp (24hrs), Avg:98.6 F (37 C), Min:97.6 F (36.4 C), Max:99.4 F (37.4 C)   Recent Labs Lab 09/28/15 1853 09/29/15 0449 10/01/15 0415 10/02/15 0628 10/02/15 1758 10/03/15 0553 10/03/15 1823  WBC 23.8* 18.3* 21.0* 17.8*  --  13.2*  --   CREATININE 0.65 0.65 0.50* 0.45*  --  0.49*  --   VANCOTROUGH  --   --   --   --  14  --  15    Estimated Creatinine Clearance: 95.9 mL/min (by C-G formula based on Cr of 0.49).    Allergies  Allergen Reactions  . Penicillins Other (See Comments)    Reaction:  Unknown     Antimicrobials this admission: Vancomycin 4/4, 4/14>> Levofloxacin 4/4>> Flagyl 4/5 >> 4/13  Dose adjustments this admission: 4/15:  Vanc trough @ 18:00 = 14 mcg/mL .    Dose increased from Vanc 750 mg IV Q8H to Vanc 1 gm IV Q8H.   Microbiology results:  BCx:  NG  UCx:  NG  Sputum:    MRSA PCR: negative  Thank you for allowing pharmacy to be a part of this patient's care.  Sean [name], Sean [name] 10/04/2015 8:40 AM

## 2015-10-04 NOTE — Progress Notes (Signed)
Lab called critical trough  LandAmerica FinancialKim king. [facility] results [facility].0

## 2015-10-04 NOTE — Plan of Care (Signed)
Problem: Activity: Goal: Risk for activity intolerance will decrease Outcome: Not Progressing Pt refused  To work with p.t. Today and get oob  Problem: Nutrition: Goal: Adequate nutrition will be maintained Outcome: Not Progressing Not eating a lot. Drank glucerna today

## 2015-10-04 NOTE — Plan of Care (Signed)
Problem: Health Behavior/Discharge Planning: Goal: Ability to manage health-related needs will improve Outcome: Not Applicable Date Met:  11/94/17 Pt dependent on family for care.  Problem: Nutrition: Goal: Adequate nutrition will be maintained Outcome: Progressing Pt requires to be feed. Given HS snack this evening with 100% eaten.  Problem: Education: Goal: Knowledge of disease or condition will improve Outcome: Progressing Spoke with family this evening and verbalize pt condition and plan of care. Goal: Knowledge of the prescribed therapeutic regimen will improve Outcome: Progressing Per family Goal: Ability to identify and alter actions that are detrimental to health will improve Outcome: Progressing Per family  Problem: Health Behavior/Discharge Planning: Goal: Ability to manage health-related needs will improve Outcome: Progressing Per family

## 2015-10-04 NOTE — Progress Notes (Signed)
Pharmacy Antibiotic Note  Sean [name] is a 50 y.o. male admitted on 09/21/2015 with sepsis.  Pharmacy has been consulted for Vancomycin dosing.  Plan: 4/15 :  VT @ 18:00 = 14 mcg/mL .  Will increase dose to Vanc 1 gm IV Q8H to start 4/15 @ 19:00.  Will recheck VT on 4/16 @ 1830.  Height: 5\' 5"  (165.1 cm) Weight: 133 lb 13.1 oz (60.7 kg) IBW/kg (Calculated) : 61.5  Temp (24hrs), Avg:98.2 F (36.8 C), Min:97.6 F (36.4 C), Max:98.9 F (37.2 C)   Recent Labs Lab 09/28/15 1853 09/29/15 0449 10/01/15 0415 10/02/15 0628  10/03/15 0553  10/04/15 1825 10/04/15 2239  WBC 23.8* 18.3* 21.0* 17.8*  --  13.2*  --   --   --   CREATININE 0.65 0.65 0.50* 0.45*  --  0.49*  --   --   --   VANCOTROUGH  --   --   --   --   < >  --   < > 24* 16  < > = values in this interval not displayed.  Estimated Creatinine Clearance: 95.9 mL/min (by C-G formula based on Cr of 0.49).    Allergies  Allergen Reactions  . Penicillins Other (See Comments)    Reaction:  Unknown     Antimicrobials this admission:   >>    >>   Dose adjustments this admission: 4/15:  Vanc trough @ 18:00 = 14 mcg/mL .    Dose increased from Vanc 750 mg IV Q8H to Vanc 1 gm IV Q8H.   4/17 at 18:25 Trough = 24. Will recheck level at 22:30.   4/17 22:30 vanc level 16. Restarting at 1 gram q 12 hours. Level before 4th new dose.  Microbiology results:  BCx:   UCx:    Sputum:    MRSA PCR:   Thank you for allowing pharmacy to be a part of this patient's care.  Azalie Harbeck S 10/04/2015 11:26 PM

## 2015-10-04 NOTE — Progress Notes (Signed)
[employer] - [place] at [facility]   PATIENT NAME: Sean [name]    MR#:  [id number]  DATE OF BIRTH:  [birth year]  SUBJECTIVE:   Chest tube still in place. X-ray 4/14 shows improvement in the size of the effusion. No evidence of pneumothorax. Patient still complaining of pain in the chest tube site.  REVIEW OF SYSTEMS:    Review of Systems  Constitutional: Negative for fever and chills.  HENT: Negative for congestion and tinnitus.   Eyes: Negative for blurred vision and double vision.  Respiratory: Negative for cough, shortness of breath and wheezing.   Cardiovascular: Negative for chest pain, orthopnea and PND.  Gastrointestinal: Negative for nausea, vomiting, abdominal pain and diarrhea.  Genitourinary: Negative for dysuria and hematuria.  Neurological: Negative for dizziness, sensory change and focal weakness.  All other systems reviewed and are negative.   Nutrition: Carb control Tolerating Diet: Yes Tolerating PT: Evaluation noted    DRUG ALLERGIES:   Allergies  Allergen Reactions  . Penicillins Other (See Comments)    Reaction:  Unknown     VITALS:  Blood pressure 144/72, pulse 96, temperature 97.6 F (36.4 C), temperature source Oral, resp. rate 22, height 5\' 5"  (1.651 m), weight 60.7 kg (133 lb 13.1 oz), SpO2 95 %.  PHYSICAL EXAMINATION:   Physical Exam  GENERAL:  50 y.o.-year-old patient lying in the bed in no acute distress.  EYES: Pupils equal, round, reactive to light and accommodation. No scleral icterus. Extraocular muscles intact.  HEENT: Head atraumatic, normocephalic. Oropharynx and nasopharynx clear.  NECK:  Supple, no jugular venous distention. No thyroid enlargement, no tenderness.  LUNGS: Normal breath sounds bilaterally, no wheezing, rales, rhonchi. No use of accessory muscles of respiration. Left-sided chest tube in place. CARDIOVASCULAR: S1, S2 normal. No murmurs, rubs, or gallops.  ABDOMEN: Soft, nontender, nondistended. Bowel  sounds present. No organomegaly or mass.  EXTREMITIES: No cyanosis, clubbing or edema b/l.    NEUROLOGIC: Cranial nerves II through XII are intact. No focal Motor or sensory deficits b/l.  Globally weak PSYCHIATRIC: The patient is alert and oriented x 1.  SKIN: No obvious rash, lesion, or ulcer.    LABORATORY PANEL:   CBC  Recent Labs Lab 10/03/15 0553  WBC 13.2*  HGB 10.0*  HCT 29.7*  PLT 643*   ------------------------------------------------------------------------------------------------------------------  Chemistries   Recent Labs Lab 09/29/15 0449  10/03/15 0553  NA 130*  < > 132*  K 4.8  < > 3.1*  CL 102  < > 101  CO2 24  < > 28  GLUCOSE 270*  < > 100*  BUN 17  < > 7  CREATININE 0.65  < > 0.49*  CALCIUM 6.7*  < > 7.1*  AST 31  --   --   ALT 19  --   --   ALKPHOS 57  --   --   BILITOT 0.7  --   --   < > = values in this interval not displayed. ------------------------------------------------------------------------------------------------------------------  Cardiac Enzymes No results for input(s): TROPONINI in the last 168 hours. ------------------------------------------------------------------------------------------------------------------  RADIOLOGY:  Dg Chest 2 View  10/02/2015  CLINICAL DATA:  F/u post op day 4 left thoracotomy, left chest tube EXAM: CHEST  2 VIEW COMPARISON:  10/01/2015 FINDINGS: Motion and position degraded lateral view. 3 left-sided chest tubes are similar. Midline trachea. Normal heart size. Small volume left-sided pleural fluid, improved. No pneumothorax. Improved bibasilar airspace disease, worse on the left. IMPRESSION: Left-sided chest tubes remaining in place with  decreased pleural fluid and no pneumothorax. Improved bibasilar Airspace disease, likely atelectasis. Electronically Signed   By: Jeronimo Greaves M.D.   On: 10/02/2015 16:04     ASSESSMENT AND PLAN:   50 year old male with past medical history of hypertension,  diabetes, mental retardation who presented to the hospital with pneumonia and empyema.  1. Sepsis-this was secondary to left lower lobe pneumonia with loculated effusion/empyema. -Patient is status post decortication and chest tube placement. WBC count trending down now.  -Afebrile, hemodynamically stable. -Cultures so far negative. Continue empiric vancomycin, Levaquin  2. Pneumonia with empyema-patient is status post left-sided thoracotomy and decortication. Postop day #6. -Appreciate surgical input. Continue empiric antibiotics with vancomycin, Levaquin. Cultures so far negative and WBC count trending down.  - CXR from 4/15 showing improvement in effusion and no pneumothorax.  -Discussed with Dr. Thelma Barge today and patient's chest tubes likely need to be in for 6-8 weeks. He will continue to follow the patient as outpatient. The drainage from the chest tubes have improved.  3. Lactic acidosis-due to sepsis and pneumonia. Now resolved.  4. Hyponatremia-mildly hypovolemic versus SIADH due to his empyema and pneumonia. -Sodium improved. Will monitor. Asymptomatic presently.  5. Hypokalemia- continue oral supplements and repeat level in the morning.  6. Diabetes type 2 without complication-continue Lantus, sliding scale insulin. Follow blood sugars.  7. Constipation-resolved.  Cont. Senna  Discussed with clinical social work as patient's family adamantly does not want him going home unless she is able to do some of his ADLs. Patient has Medicaid and Social work to look for places that could possibly take him.     All the records are reviewed and case discussed with Care Management/Social Workerr. Management plans discussed with the patient, family and they are in agreement.  CODE STATUS: Full  DVT Prophylaxis: Lovenox  TOTAL TIME TAKING CARE OF THIS PATIENT: 30 minutes.   POSSIBLE D/C IN 2-3 DAYS, DEPENDING ON CLINICAL CONDITION.  Greater than 50% of time spent in coordination of care  in discussion of patient's plan of care with Dr. Thelma Barge, clinical social work.   Houston Siren M.D on 10/04/2015 at 1:24 PM  Between 7am to 6pm - Pager - [phone]  After 6pm go to [email or web address] - password [email or web address] [facility]  [place] [facility]  Office  [phone]  CC: Primary care physician; Emogene Morgan, MD

## 2015-10-04 NOTE — Progress Notes (Signed)
Afebrile.  Looks and acts well.  Wounds clean and dry.  JP drained 50 yesterday Chest tubes in total drained less than 100 cc in 24 hours  CXRay continues to improve  Will convert chest tubes to empyema tubes within the next few days in preparation for discharge  [employer] Emrah Ariola.

## 2015-10-04 NOTE — Progress Notes (Signed)
PT Cancellation Note  Patient Details Name: Sean [name] MRN: [id number] DOB: [birth year]   Cancelled Treatment:    Reason Eval/Treat Not Completed: Patient declined, no reason specified   Pt in bed.  Nursing reported generally good communication without language line.  Attempted without interpreter but pt declined resisting attempts to get out of bed.  Language line used to make instructions clear to pt.  Pt again declined session through interpreter stating he may try later.     Danielle DessSarah Ceil Roderick, PTA 10/04/2015, 1:33 PM

## 2015-10-05 ENCOUNTER — Inpatient Hospital Stay: Payer: Medicaid Other

## 2015-10-05 LAB — BASIC METABOLIC PANEL
Anion gap: 5 (ref 5–15)
BUN: 11 mg/dL (ref 6–20)
CHLORIDE: 97 mmol/L — AB (ref 101–111)
CO2: 29 mmol/L (ref 22–32)
Calcium: 8.3 mg/dL — ABNORMAL LOW (ref 8.9–10.3)
Creatinine, Ser: 0.78 mg/dL (ref 0.61–1.24)
GFR calc non Af Amer: >60 mL/min (ref >60)
Glucose, Bld: 188 mg/dL — ABNORMAL HIGH (ref 65–99)
POTASSIUM: 4.5 mmol/L (ref 3.5–5.1)
SODIUM: 131 mmol/L — AB (ref 135–145)

## 2015-10-05 LAB — CBC
HEMATOCRIT: 31 % — AB (ref 40.0–52.0)
Hemoglobin: 10.2 g/dL — ABNORMAL LOW (ref 13.0–18.0)
MCH: 28.6 pg (ref 26.0–34.0)
MCHC: 33 g/dL (ref 32.0–36.0)
MCV: 86.8 fL (ref 80.0–100.0)
Platelets: 748 10*3/uL — ABNORMAL HIGH (ref 150–440)
RBC: 3.57 MIL/uL — AB (ref 4.40–5.90)
RDW: 13.7 % (ref 11.5–14.5)
WBC: 12.1 10*3/uL — AB (ref 3.8–10.6)

## 2015-10-05 LAB — GLUCOSE, CAPILLARY
Glucose-Capillary: 120 mg/dL — ABNORMAL HIGH (ref 65–99)
Glucose-Capillary: 184 mg/dL — ABNORMAL HIGH (ref 65–99)
Glucose-Capillary: 330 mg/dL — ABNORMAL HIGH (ref 65–99)
Glucose-Capillary: 210 mg/dL — ABNORMAL HIGH (ref 65–99)

## 2015-10-05 MED ORDER — SODIUM CHLORIDE 0.9 % IV SOLN
INTRAVENOUS | Status: DC
  Administered 2015-10-05 – 2015-10-06 (×2): via INTRAVENOUS

## 2015-10-05 NOTE — Progress Notes (Signed)
[employer] - [place] at [facility]   PATIENT NAME: Sean [name]    MR#:  [id number]  DATE OF BIRTH:  [birth year]  SUBJECTIVE:   Chest tube still in place. X-ray 4/14 shows improvement in the size of the effusion. No evidence of pneumothorax. Patient still complaining of pain in the chest tube site. Patient wants to go home. Patient was evaluated by physical therapist and recommended home health services with 24-hour supervision. Discussed this patient via interpreter  REVIEW OF SYSTEMS:    Review of Systems  Constitutional: Negative for fever and chills.  HENT: Negative for congestion and tinnitus.   Eyes: Negative for blurred vision and double vision.  Respiratory: Negative for cough, shortness of breath and wheezing.   Cardiovascular: Negative for chest pain, orthopnea and PND.  Gastrointestinal: Negative for nausea, vomiting, abdominal pain and diarrhea.  Genitourinary: Negative for dysuria and hematuria.  Neurological: Negative for dizziness, sensory change and focal weakness.  All other systems reviewed and are negative.   Nutrition: Carb control Tolerating Diet: Yes Tolerating PT: Evaluation noted    DRUG ALLERGIES:   Allergies  Allergen Reactions  . Penicillins Other (See Comments)    Reaction:  Unknown     VITALS:  Blood pressure 148/79, pulse 102, temperature 97.5 F (36.4 C), temperature source Oral, resp. rate 16, height 5\' 5"  (1.651 m), weight 60.7 kg (133 lb 13.1 oz), SpO2 94 %.  PHYSICAL EXAMINATION:   Physical Exam  GENERAL:  50 y.o.-year-old patient lying in the bed in no acute distress.  EYES: Pupils equal, round, reactive to light and accommodation. No scleral icterus. Extraocular muscles intact.  HEENT: Head atraumatic, normocephalic. Oropharynx and nasopharynx clear.  NECK:  Supple, no jugular venous distention. No thyroid enlargement, no tenderness.  LUNGS: Normal breath sounds bilaterally, no wheezing, rales, rhonchi. No use of  accessory muscles of respiration. Left-sided chest tube in place. CARDIOVASCULAR: S1, S2 normal. No murmurs, rubs, or gallops.  ABDOMEN: Soft, nontender, nondistended. Bowel sounds present. No organomegaly or mass.  EXTREMITIES: No cyanosis, clubbing or edema b/l.    NEUROLOGIC: Cranial nerves II through XII are intact. No focal Motor or sensory deficits b/l.  Globally weak PSYCHIATRIC: The patient is alert and oriented x 1.  SKIN: No obvious rash, lesion, or ulcer.    LABORATORY PANEL:   CBC  Recent Labs Lab 10/03/15 0553  WBC 13.2*  HGB 10.0*  HCT 29.7*  PLT 643*   ------------------------------------------------------------------------------------------------------------------  Chemistries   Recent Labs Lab 09/29/15 0449  10/03/15 0553  NA 130*  < > 132*  K 4.8  < > 3.1*  CL 102  < > 101  CO2 24  < > 28  GLUCOSE 270*  < > 100*  BUN 17  < > 7  CREATININE 0.65  < > 0.49*  CALCIUM 6.7*  < > 7.1*  AST 31  --   --   ALT 19  --   --   ALKPHOS 57  --   --   BILITOT 0.7  --   --   < > = values in this interval not displayed. ------------------------------------------------------------------------------------------------------------------  Cardiac Enzymes No results for input(s): TROPONINI in the last 168 hours. ------------------------------------------------------------------------------------------------------------------  RADIOLOGY:  No results found.   ASSESSMENT AND PLAN:   50 year old male with past medical history of hypertension, diabetes, mental retardation who presented to the hospital with pneumonia and empyema.  1. Sepsis-this was secondary to left lower lobe pneumonia with loculated effusion/empyema. -Patient is status  post decortication and chest tube placement. WBC count trending down now. Check CBC -Afebrile, hemodynamically stable. -Cultures so far negative. Continue empiric vancomycin, Levaquin  2. Pneumonia with empyema-patient is status post  left-sided thoracotomy and decortication. Postop day #7. -Appreciate surgical input. Continue empiric antibiotics with vancomycin, Levaquin. Cultures so far negative and WBC count trending down.  - CXR from 4/15 showing improvement in effusion and no pneumothorax.  -Dr. Inez Catalina is planning to convert chest tubes to empyema tube is within the next few days in preparation for discharge. Chest tube drainage has decreased.   3. Lactic acidosis-due to sepsis and pneumonia. Now resolved.  4. Hyponatremia-mildly hypovolemic versus SIADH due to his empyema and pneumonia. -Sodium improved. Will monitor. Asymptomatic presently. Recheck sodium level today  5. Hypokalemia- continue oral supplements and repeat level today.  6. Diabetes type 2 without complication-continue Lantus, sliding scale insulin. Follow blood sugars.  7. Constipation-resolved.  Cont. Senna  Discussed with clinical social work as patient's family adamantly does not want him going home unless the patient is able to do some of his ADLs. Patient has Medicaid and placement close to impossible. Obtaining occupational therapist recommendations, discussed with social work today  All the records are reviewed and case discussed with Care Management/Social Workerr. Management plans discussed with the patient, family and they are in agreement.  CODE STATUS: Full  DVT Prophylaxis: Lovenox  TOTAL TIME TAKING CARE OF THIS PATIENT: 45 minutes.   POSSIBLE D/C IN 2-3 DAYS, DEPENDING ON CLINICAL CONDITION.  About 20 minutes was spent discussing  With the  patient  with the help of interpreter.    Katharina Caper M.D on 10/05/2015 at 1:21 PM  Between 7am to 6pm - Pager - [phone]  After 6pm go to [email or web address] - password [email or web address] [facility]  [place] [facility]  Office  [phone]  CC: Primary care physician; Emogene Morgan, MD

## 2015-10-05 NOTE — Progress Notes (Signed)
Appears comfortable but brothers state he still has some pain at chest tube site  Afebrile.  Tmax 99.5  Wounds clean No air leak  Minimal drainage  Converted to "empty atrium" in preparation for "empyema tubes"  Will check STAT Cxray to assess for pneumothorax  [employer] [name].

## 2015-10-05 NOTE — Care Management (Signed)
Spoke with Dr. Thelma Bargeaks. Possibly will pull chest tube tomorrow following review of chest x-ray. Will need skilled facility when discharged. Gwenette GreetBrenda S Drey Shaff RN MSN CCM Care Management [phone]

## 2015-10-05 NOTE — Progress Notes (Signed)
Physical Therapy Treatment Patient Details Name: Aileen Pilotsif Seyfried MRN: [id number] DOB: 03-31-1966 Today's Date: 10/05/2015    History of Present Illness      PT Comments    Pt in bed ready for session.  2 assist required for chest tube management.  Supine to edge of bed with min a x 1.  Stood with min a x 1.  Ambulated 160' x 1 with rolling walker and min a x 1 with +1 to manage equipment.  Gait unsteady but no loss of balances noted.  Unsafe to ambulate or transfer without assist.  No discomfort noted with mobility.     Follow Up Recommendations  Home health PT;Supervision/Assistance - 24 hour     Equipment Recommendations  Rolling walker with 5" wheels    Recommendations for Other Services       Precautions / Restrictions Restrictions Weight Bearing Restrictions: No    Mobility  Bed Mobility Overal bed mobility: Needs Assistance;+ 2 for safety/equipment Bed Mobility: Supine to Sit     Supine to sit: Min assist        Transfers Overall transfer level: Needs assistance Equipment used: Rolling walker (2 wheeled) Transfers: Sit to/from Stand Sit to Stand: +2 safety/equipment;Min assist            Ambulation/Gait Ambulation/Gait assistance: +2 safety/equipment;Min assist Ambulation Distance (Feet): 160 Feet Assistive device: Rolling walker (2 wheeled) Gait Pattern/deviations: Step-through pattern   Gait velocity interpretation: Below normal speed for age/gender General Gait Details: broad BOS, short choppy steps; guarded trunk posturing with limited rotation and arm swing (likely due to pain), but no buckling or LOB.  Breathing slow and even without SOB/DOE noted; vitals stable and WFL.   Stairs            Wheelchair Mobility    Modified Rankin (Stroke Patients Only)       Balance   Sitting-balance support: Feet supported Sitting balance-Leahy Scale: Fair     Standing balance support: Bilateral upper extremity supported                         Cognition                            Exercises      General Comments        Pertinent Vitals/Pain      Home Living                      Prior Function            PT Goals (current goals can now be found in the care plan section) Progress towards PT goals: Progressing toward goals    Frequency  Min 2X/week    PT Plan      Co-evaluation             End of Session   Activity Tolerance: Other (comment);Patient tolerated treatment well Patient left: in chair;with call bell/phone within reach;with chair alarm set;in bed     Time: [id number] PT Time Calculation (min) (ACUTE ONLY): 15 min  Charges:  $Gait Training: 8-22 mins                    G Codes:      Danielle DessSarah Corben Auzenne, PTA 10/05/2015, 9:20 AM

## 2015-10-05 NOTE — Progress Notes (Signed)
OT Cancellation Note  Patient Details Name: Sean [name] MRN: [id number] DOB: 1966/05/19   Cancelled Treatment:    Reason Eval/Treat Not Completed: Medical issues which prohibited therapy (Potassium 3.1. Will continues to monitor, and eval as appropriate.)  Sean MessierElaine Jos Cygan, MS, OTR/L  Sean [name] 10/05/2015, 9:17 AM

## 2015-10-06 LAB — CBC
HCT: 27.5 % — ABNORMAL LOW (ref 40.0–52.0)
Hemoglobin: 9.4 g/dL — ABNORMAL LOW (ref 13.0–18.0)
MCH: 29.7 pg (ref 26.0–34.0)
MCHC: 34.2 g/dL (ref 32.0–36.0)
MCV: 86.8 fL (ref 80.0–100.0)
Platelets: 664 10*3/uL — ABNORMAL HIGH (ref 150–440)
RBC: 3.17 MIL/uL — ABNORMAL LOW (ref 4.40–5.90)
RDW: 13.5 % (ref 11.5–14.5)
WBC: 9.8 10*3/uL (ref 3.8–10.6)

## 2015-10-06 LAB — CREATININE, SERUM: CREATININE: 0.77 mg/dL (ref 0.61–1.24)

## 2015-10-06 LAB — GLUCOSE, CAPILLARY
GLUCOSE-CAPILLARY: 106 mg/dL — AB (ref 65–99)
GLUCOSE-CAPILLARY: 173 mg/dL — AB (ref 65–99)
GLUCOSE-CAPILLARY: 221 mg/dL — AB (ref 65–99)
Glucose-Capillary: 82 mg/dL (ref 65–99)
Glucose-Capillary: 258 mg/dL — ABNORMAL HIGH (ref 65–99)

## 2015-10-06 LAB — VANCOMYCIN, TROUGH: VANCOMYCIN TR: 14 ug/mL (ref 10–20)

## 2015-10-06 LAB — OSMOLALITY, URINE: OSMOLALITY UR: 360 mosm/kg (ref 300–900)

## 2015-10-06 LAB — SODIUM: SODIUM: 131 mmol/L — AB (ref 135–145)

## 2015-10-06 NOTE — Progress Notes (Signed)
Nutrition Follow-up   INTERVENTION:   -Cater to pt preferences. RD took handwritten preferences on pt's whiteboard written by family member to dining services for meal times when they are not available to call; host aware as well. -Recommend continuing Glucerna Shake po TID, each supplement provides 220 kcal and 10 grams of protein   NUTRITION DIAGNOSIS:   Altered GI function related to acute illness as evidenced by  (constipation, no BM since admission).  GOAL:   Patient will meet greater than or equal to 90% of their needs  MONITOR:   PO intake, Labs, Weight trends, I & O's  REASON FOR ASSESSMENT:   LOS    ASSESSMENT:   Pt admitted with Sepsis with left LL pneumonia and pleural effusion/loculated empyema, patient's status post decortication and chest tube in place. Pt speaks rare dialect and h/o MR per chart review.  Pt with chest tube in place, possibly removing after chest xray per MD note.  Diet Order:  Diet general Diet Carb Modified Fluid consistency:: Thin; Room service appropriate?: Yes    Pt lunch tray just delivered. Per RN Duwayne Heckanielle who aided pt yesterday as well reported that pt does not eat much breakfast but then eats well and lunch and dinner most days. Question if pt drinking Glucerna or not as Nsg reports he has not been drinking as of yesterday however written by family to receive strawberry flavor.    Gastrointestinal Profile: Last BM: 10/05/2015  Medications: Colace, Lantus, Novolog, Senokot, NS at 7450mL/hr Labs: Na 131, Glu 188 yesterday   Weight Trend since Admission: Filed Weights   09/21/15 1306 09/28/15 1222 09/28/15 2000  Weight: 125 lb 10.6 oz (57 kg) 125 lb (56.7 kg) 133 lb 13.1 oz (60.7 kg)     Skin:  Reviewed, no issues   BMI:  Body mass index is 22.27 kg/(m^2).  Estimated Nutritional Needs:   Kcal:  1850-2170kcals  Protein:  61-73g protein  Fluid:  >1.9L fluid  EDUCATION NEEDS:   No education needs identified at this  time  Leda QuailAllyson Burhan Barham, RD, LDN Pager [phone] Weekend/On-Call Pager [phone]

## 2015-10-06 NOTE — Progress Notes (Signed)
Inpatient Diabetes Program Recommendations  AACE/ADA: New Consensus Statement on Inpatient Glycemic Control (2015)  Target Ranges:  Prepandial:   less than 140 mg/dL      Peak postprandial:   less than 180 mg/dL (1-2 hours)      Critically ill patients:  140 - 180 mg/dL  Results for [name], Sean (MRN [id number]) as of 10/06/2015 10:02  Ref. Range 10/05/2015 07:57 10/05/2015 11:51 10/05/2015 17:04 10/05/2015 21:32 10/06/2015 08:06  Glucose-Capillary Latest Ref Range: 65-99 mg/dL 782120 (H) 956184 (H) 213330 (H) 210 (H) 106 (H)   Review of Glycemic Control  Diabetes history: Type 2 Outpatient Diabetes medications: Lantus 36 units qhs, Metformin 1000mg  bid Current orders for Inpatient glycemic control: Lantus 14 units qhs, Novolog 0-15 units tid, Novolog 0-5 units qhs  Inpatient Diabetes Program Recommendations:   Insulin-Meal Coverage: Post prandial blood sugars remain elevated. Please consider adding Novolog 3 units TID with meals if patient eats at least 50% of meals (in addition to Novolog correction insulin).  Thanks, Sean PennerMarie Jaevin Medearis, RN, MSN, CDE Diabetes Coordinator Inpatient Diabetes Program [phone] (Team Pager from 8am to 5pm) [phone] ([facility] office) [phone] [facility] office) [phone] [facility] office)

## 2015-10-06 NOTE — Progress Notes (Signed)
Physical Therapy Treatment Patient Details Name: Sean [name] MRN: [id number] DOB: 02/09/1966 Today's Date: 10/06/2015    History of Present Illness presented to ER 4/4 secondary to abdominal pain, cough and SOB; admitted with sepsis related to PNA and L pleural effusion/loculated empyema.  Hospital course significant for pleurx catheter placement (4/6) with revision/replacement (4/7) and subsequent L chest thoracotomy and decortication (4/11)    PT Comments    Pt demonstrates some instability with gait however improves with UE support and minA+1 from therapist. Intermittent stumbling which would benefit from 24/7 assist and rolling walker from family. Pt refuses to participate with exercises after ambulation and refuses up to recliner. Interpreter [phone] utilized for entire visit. Pt will benefit from skilled PT services to address deficits in strength, balance, and mobility in order to return to full function at home.    Follow Up Recommendations  Home health PT;Supervision/Assistance - 24 hour     Equipment Recommendations  Rolling walker with 5" wheels    Recommendations for Other Services       Precautions / Restrictions Precautions Precautions: Fall Precaution Comments: L chest tube (x3), L chest JP drain, CT to water seal for ambulation (per Dr. Thelma Barge order), no lifting >15 lbs x4 weeks, pain epidural in place Restrictions Weight Bearing Restrictions: No    Mobility  Bed Mobility Overal bed mobility: Needs Assistance Bed Mobility: Supine to Sit;Sit to Supine     Supine to sit: Min assist Sit to supine: Min guard   General bed mobility comments: Pt requires UE assist to right trunk from R sidelying to sitting  Transfers Overall transfer level: Needs assistance Equipment used: Rolling walker (2 wheeled) Transfers: Sit to/from Stand Sit to Stand: Min guard         General transfer comment: Pt demonstrates good speed and sequencing with LE for sit to  stand  Ambulation/Gait Ambulation/Gait assistance: Min assist Ambulation Distance (Feet): 200 Feet Assistive device: Rolling walker (2 wheeled) Gait Pattern/deviations: Step-through pattern Gait velocity: Decreased Gait velocity interpretation: <1.8 ft/sec, indicative of risk for recurrent falls General Gait Details: Pt demonstrates decreased step length. and overall gait speed. Fatigue and vitals monitored throughout. Pt with some instability during turns with occasional stumble and minA+1 support from therapist. Attempted to obtain vitals during ambulation however difficult to obtain SaO2 on either hand. No overt DOE noted however a couple standing rest breaks provided   Careers information officer    Modified Rankin (Stroke Patients Only)       Balance Overall balance assessment: Needs assistance Sitting-balance support: No upper extremity supported Sitting balance-Leahy Scale: Fair     Standing balance support: No upper extremity supported Standing balance-Leahy Scale: Fair                      Cognition Arousal/Alertness: Awake/alert Behavior During Therapy: WFL for tasks assessed/performed Overall Cognitive Status: Difficult to assess                      Exercises      General Comments        Pertinent Vitals/Pain Pain Assessment: 0-10 Pain Score: 10-Worst pain ever Pain Location: L chest/flankl Pain Intervention(s): Monitored during session;Limited activity within patient's tolerance    Home Living                      Prior Function  PT Goals (current goals can now be found in the care plan section) Progress towards PT goals: Progressing toward goals    Frequency  Min 2X/week    PT Plan Current plan remains appropriate    Co-evaluation             End of Session Equipment Utilized During Treatment: Gait belt Activity Tolerance: Patient tolerated treatment well Patient left: in  bed;with call bell/phone within reach;with bed alarm set;Other (comment) (Refuses up to recliner)     Time: [id number] PT Time Calculation (min) (ACUTE ONLY): 19 min  Charges:  $Gait Training: 8-22 mins                    G Codes:      Sharalyn InkJason D Machele Deihl PT, DPT   [name] 10/06/2015, 5:03 PM

## 2015-10-06 NOTE — Progress Notes (Signed)
[employer] [facility] - [place] [facility]   PATIENT NAME: Sean [name]    MR#:  [id number]  DATE OF BIRTH:  March 20, 1966  SUBJECTIVE:  Doing ok, wants to go home, no new complaints.  REVIEW OF SYSTEMS:    Review of Systems  Constitutional: Negative for fever and chills.  HENT: Negative for congestion and tinnitus.   Eyes: Negative for blurred vision and double vision.  Respiratory: Negative for cough, shortness of breath and wheezing.   Cardiovascular: Negative for chest pain, orthopnea and PND.  Gastrointestinal: Negative for nausea, vomiting, abdominal pain and diarrhea.  Genitourinary: Negative for dysuria and hematuria.  Neurological: Negative for dizziness, sensory change and focal weakness.  All other systems reviewed and are negative.   Nutrition: Carb control Tolerating Diet: Yes Tolerating PT: Evaluation noted    DRUG ALLERGIES:   Allergies  Allergen Reactions  . Penicillins Other (See Comments)    Reaction:  Unknown     VITALS:  Blood pressure 120/58, pulse 82, temperature 98.7 F (37.1 C), temperature source Oral, resp. rate 18, height 5\' 5"  (1.651 m), weight 60.7 kg (133 lb 13.1 oz), SpO2 98 %.  PHYSICAL EXAMINATION:   Physical Exam  GENERAL:  50 y.o.-year-old patient lying in the bed in no acute distress.  EYES: Pupils equal, round, reactive to light and accommodation. No scleral icterus. Extraocular muscles intact.  HEENT: Head atraumatic, normocephalic. Oropharynx and nasopharynx clear.  NECK:  Supple, no jugular venous distention. No thyroid enlargement, no tenderness.  LUNGS: Normal breath sounds bilaterally, no wheezing, rales, rhonchi. No use of accessory muscles of respiration. Left-sided chest tube in place. CARDIOVASCULAR: S1, S2 normal. No murmurs, rubs, or gallops.  ABDOMEN: Soft, nontender, nondistended. Bowel sounds present. No organomegaly or mass.  EXTREMITIES: No cyanosis, clubbing or edema b/l.    NEUROLOGIC: Cranial nerves II  through XII are intact. No focal Motor or sensory deficits b/l.  Globally weak PSYCHIATRIC: The patient is alert and oriented x 1.  SKIN: No obvious rash, lesion, or ulcer.    LABORATORY PANEL:   CBC  Recent Labs Lab 10/06/15 0548  WBC 9.8  HGB 9.4*  HCT 27.5*  PLT 664*   ------------------------------------------------------------------------------------------------------------------  Chemistries   Recent Labs Lab 10/05/15 1334 10/06/15 0548  NA 131* 131*  K 4.5  --   CL 97*  --   CO2 29  --   GLUCOSE 188*  --   BUN 11  --   CREATININE 0.78 0.77  CALCIUM 8.3*  --    ------------------------------------------------------------------------------------------------------------------  Cardiac Enzymes No results for input(s): TROPONINI in the last 168 hours. ------------------------------------------------------------------------------------------------------------------  RADIOLOGY:  Dg Chest 2 View  10/05/2015  CLINICAL DATA:  Empyema. EXAM: CHEST  2 VIEW COMPARISON:  10/02/2015 FINDINGS: AP and lateral views of the chest show persistence of the 3 left thoracotomy tubes, stable in position. Soft tissue drain in the left lateral chest wall remains in place. No evidence for pneumothorax. There is left base collapse/consolidation, but this is improved since prior study. Right lung remains clear. Cardiopericardial silhouette is at upper limits of normal for size. IMPRESSION: Stable position of left-sided chest tubes with improving aeration at the left lung base. No evidence for pneumothorax. Electronically Signed   By: Kennith CenterEric  Mansell M.D.   On: 10/05/2015 14:16     ASSESSMENT AND PLAN:  50 year old male with past medical history of hypertension, diabetes, mental retardation who presented to the hospital with pneumonia and empyema.  1. Sepsis-this was secondary to left  lower lobe pneumonia with loculated effusion/empyema. Now resolved -Patient is status post decortication and  chest tube placement. -  -Afebrile, hemodynamically stable. -Cultures so far negative. Stop all abx  2. Pneumonia with empyema-patient is status post left-sided thoracotomy and decortication. Postop day #7. -Appreciate surgical input. Continue empiric antibiotics with vancomycin, Levaquin. Cultures so far negative and WBC count trending down.  - CXR from 4/15 showing improvement in effusion and no pneumothorax.  -Dr. Fredrik Cove to "empty atrium" in preparation for "empyema tubes" on 4/18  3. Lactic acidosis-due to sepsis and pneumonia. Now resolved.  4. Hyponatremia-mildly hypovolemic versus SIADH due to his empyema and pneumonia. -Sodium 131. Will monitor. Asymptomatic presently.   5. Hypokalemia- replete and recheck.  6. Diabetes type 2 without complication-continue Lantus, sliding scale insulin. Follow blood sugars.  7. Constipation-resolved.  Cont. Senna  Likely home tomorrow  All the records are reviewed and case discussed with Care Management/Social Worker. Management plans discussed with the patient, family and they are in agreement.  CODE STATUS: Full  DVT Prophylaxis: Lovenox  TOTAL TIME TAKING CARE OF THIS PATIENT: 25 minutes.   POSSIBLE D/C IN 1-2 days, DEPENDING ON CLINICAL CONDITION.     [facility], Jenisha Faison M.D on 10/06/2015 at 5:10 PM  Between 7am to 6pm - Pager - [phone]  After 6pm go to [email or web address] - password [email or web address] [facility]  [facility]  Office  [phone]  CC: Primary care physician; Emogene Morgan, MD

## 2015-10-06 NOTE — Care Management (Signed)
Patient's primary payer is Medicaid which will not pay for HHPT based on current diagnosis however [facility]. Patient not currently requiring O2.

## 2015-10-06 NOTE — Evaluation (Addendum)
Occupational Therapy Evaluation Patient Details Name: Sean [name] MRN: [id number] DOB: [birth year] Today's Date: 10/06/2015    History of Present Illness presented to ER 4/4 secondary to abdominal pain, cough and SOB; admitted with sepsis related to PNA and L pleural effusion/loculated empyema.  Hospital course significant for pleurx catheter placement (4/6) with revision/replacement (4/7) and subsequent L chest thoracotomy and decortication (4/11)   Clinical Impression   Pt. Is a 50 y.o. male who was admitted with abdominal pain, cough, SOB, and sepsis. Pt. Presents with weakness, decreased activity tolerance, strength which hinders his ability to complete ADL tasks. Pt. Pt. Could benefit from skilled OT services to improve ADL and IADL functioning, and return to his PLOF. Pt. could also benefit from skilled OT services for home modification.     Follow Up Recommendations  Home health OT    Equipment Recommendations       Recommendations for Other Services  Urdu Interpreter "Arum" [phone] utilized through the language line.     Precautions / Restrictions Restrictions Weight Bearing Restrictions: No      Mobility Bed Mobility Overal bed mobility: Needs Assistance Bed Mobility: Supine to Sit     Supine to sit: Min assist        Transfers                      Balance Overall balance assessment: Needs assistance   Sitting balance-Leahy Scale: Fair                                      ADL Overall ADL's : Needs assistance/impaired                                       General ADL Comments: Pt. requires extensive assist with LE ADLs.     Vision     Perception     Praxis      Pertinent Vitals/Pain Pain Assessment:  (Positive pain. Pt. unable to rate pain. )     Hand Dominance     Extremity/Trunk Assessment Upper Extremity Assessment Upper Extremity Assessment: Generalized weakness           Communication      Cognition Arousal/Alertness: Awake/alert Behavior During Therapy: Restless Overall Cognitive Status: Difficult to assess                     General Comments       Exercises       Shoulder Instructions      Home Living Family/patient expects to be discharged to:: Private residence Living Arrangements: Other relatives Available Help at Discharge: Family                             Additional Comments: Difficult to obtain home living info. from pt.      Prior Functioning/Environment Level of Independence: Independent             OT Diagnosis: Generalized weakness   OT Problem List: Decreased strength;Impaired UE functional use;Decreased activity tolerance;Decreased safety awareness;Pain   OT Treatment/Interventions: Self-care/ADL training;Therapeutic exercise;Therapeutic activities;DME and/or AE instruction;Patient/family education;Energy conservation    OT Goals(Current goals can be found in the care plan section) Acute Rehab OT Goals OT Goal Formulation: Patient unable to participate in  goal setting  OT Frequency: Min 1X/week   Barriers to D/C:            Co-evaluation              End of Session    Activity Tolerance: Patient limited by fatigue Patient left: in bed;with call bell/phone within reach;with bed alarm set   Time: [id number] OT Time Calculation (min): 30 min Charges:  OT General Charges $OT Visit: 1 Procedure OT Evaluation $OT Eval Low Complexity: 1 Procedure G-Codes:    Olegario MessierElaine Eulah Walkup, MS, OTR/L Olegario MessierElaine Annie Roseboom 10/06/2015, 10:09 AM

## 2015-10-07 DIAGNOSIS — F79 Unspecified intellectual disabilities: Secondary | ICD-10-CM

## 2015-10-07 LAB — GLUCOSE, CAPILLARY
GLUCOSE-CAPILLARY: 80 mg/dL (ref 65–99)
GLUCOSE-CAPILLARY: 208 mg/dL — AB (ref 65–99)
GLUCOSE-CAPILLARY: 127 mg/dL — AB (ref 65–99)
Glucose-Capillary: 150 mg/dL — ABNORMAL HIGH (ref 65–99)

## 2015-10-07 MED ORDER — INSULIN ASPART 100 UNIT/ML ~~LOC~~ SOLN
3.0000 [IU] | Freq: Three times a day (TID) | SUBCUTANEOUS | Status: DC
  Administered 2015-10-07 – 2015-10-09 (×5): 3 [IU] via SUBCUTANEOUS
  Filled 2015-10-07 (×6): qty 3

## 2015-10-07 MED ORDER — INSULIN GLARGINE 100 UNIT/ML ~~LOC~~ SOLN
18.0000 [IU] | Freq: Every day | SUBCUTANEOUS | Status: DC
  Administered 2015-10-07 – 2015-10-08 (×2): 18 [IU] via SUBCUTANEOUS
  Filled 2015-10-07 (×3): qty 0.18

## 2015-10-07 NOTE — Progress Notes (Signed)
Inpatient Diabetes Program Recommendations  AACE/ADA: New Consensus Statement on Inpatient Glycemic Control (2015)  Target Ranges:  Prepandial:   less than 140 mg/dL      Peak postprandial:   less than 180 mg/dL (1-2 hours)      Critically ill patients:  140 - 180 mg/dL   Review of Glycemic Control  Results for [name], [name] (MRN [id number]) as of 10/07/2015 14:05  Ref. Range 10/06/2015 12:32 10/06/2015 16:45 10/06/2015 21:58 10/07/2015 07:41 10/07/2015 11:32  Glucose-Capillary Latest Ref Range: 65-99 mg/dL 409173 (H) 811221 (H) 914258 (H) 80 208 (H)    Diabetes history: Type 2 Outpatient Diabetes medications: Lantus 36 units qhs, Metformin 1000mg  bid Current orders for Inpatient glycemic control: Lantus 14 units qhs, Novolog 0-15 units tid, Novolog 0-5 units qhs  Inpatient Diabetes Program Recommendations:    Note- he had a blood sugar of 80 mg/dl before breakfast today but received NO correction insulin because it wasn't high enough- his CBG went up after that meal because he had no meal coverage-    Please consider adding Novolog 3 units TID with meals if patient eats at least 50% of meals (in addition to Novolog correction insulin).  Sean RacerJulie Starkeisha Vanwinkle, RN, BA, MHA, CDE Diabetes Coordinator Inpatient Diabetes Program  [phone] (Team Pager) [phone] [facility] Office) 10/07/2015 2:08 PM

## 2015-10-07 NOTE — Consult Note (Signed)
[facility] Face-to-Face Psychiatry Consult   Reason for Consult:  Consult for 50 year old man with a history of intellectual disability currently in the hospital with complicated pneumonia and empyema. Referring Physician:  Ether Griffins Patient Identification: Sean [name] MRN:  [id number] Principal Diagnosis: Intellectual disability Diagnosis:   Patient Active Problem List   Diagnosis Date Noted  . Intellectual disability [F79] 10/07/2015  . Empyema ([place]) [J86.9]   . Sepsis ([place]) [A41.9] 09/22/2015  . Pneumonia [J18.9]   . Hyponatremia [E87.1]     Total Time spent with patient: 1 hour  Subjective:   Sean [name] is a 50 y.o. male patient admitted with patient not able to provide any information.Marland Kitchen  HPI:  Attempted to interview the patient with the assistance of telephone-based Urdu interpreter. Patient was not able to provide any information. He could identify that he was in a "room" but was not able to tell me otherwise where he was. Patient did not express any specific complaints. Not able to answer any other questions directly. Hospitalist has come concerned about patient's level of cooperation with treatment noting that he is not eating very well and that he has limited cooperation with physical therapy. Physical therapy note today indicates that with use of interpreter they will able to get him to ambulate a limited amount around the ward and do toilet transfer. I discussed the patient with the current nursing staff who report that they think the patient just doesn't want to eat most of the food that we have here. They report that he does eat a reasonable amount at most meals and does not appear to be trying to avoid food.  Social history: Patient is from [place]. Reported to of been in the [place] for about 9 years. Brother appears to be closest support.  Medical history: History of diabetes. Currently in the hospital with a pneumonia that turned into an empyema requiring chest tube  placement.  Family history: Patient was not able to give any information about this.  Substance abuse history: Nothing documented about any substance abuse history.  Past Psychiatric History: Patient was not able to give any information directly. We know that he has a history of chronic intellectual impairment. Presumed to be at least advanced mild possibly moderate degree based on the descriptions of his poor ability to give information. No other known psychiatric history.  Risk to Self: Is patient at risk for suicide?: No Risk to Others:   Prior Inpatient Therapy:   Prior Outpatient Therapy:    Past Medical History:  Past Medical History  Diagnosis Date  . Diabetes mellitus without complication ([place])   . Hypertension   . Mentally disabled     Past Surgical History  Procedure Laterality Date  . Video bronchoscopy N/A 09/28/2015    Procedure: VIDEO BRONCHOSCOPY;  Surgeon: Nestor Lewandowsky, MD;  Location: [facility] ORS;  Service: Thoracic;  Laterality: N/A;  . Thoracotomy Left 09/28/2015    Procedure: THORACOTOMY MAJOR WITH DECORTICATION;  Surgeon: Nestor Lewandowsky, MD;  Location: [facility] ORS;  Service: Thoracic;  Laterality: Left;   Family History:  Family History  Problem Relation Age of Onset  . Hypertension Mother   . Diabetes Mother   . Hypertension Father   . Diabetes Father    Family Psychiatric  History: There is no information available Social History:  History  Alcohol Use No     History  Drug Use Not on file    Social History   Social History  . Marital Status: Single  Spouse Name: N/A  . Number of Children: N/A  . Years of Education: N/A   Social History Main Topics  . Smoking status: Never Smoker   . Smokeless tobacco: None     Comment: no passive smoke in home  . Alcohol Use: No  . Drug Use: None  . Sexual Activity: Not Asked   Other Topics Concern  . None   Social History Narrative   Additional Social History:    Allergies:   Allergies  Allergen  Reactions  . Penicillins Other (See Comments)    Reaction:  Unknown     Labs:  Results for orders placed or performed during the hospital encounter of 09/21/15 (from the past 48 hour(s))  Glucose, capillary     Status: Abnormal   Collection Time: 10/05/15  9:32 PM  Result Value Ref Range   Glucose-Capillary 210 (H) 65 - 99 mg/dL  Creatinine, serum     Status: None   Collection Time: 10/06/15  5:48 AM  Result Value Ref Range   Creatinine, Ser 0.77 0.61 - 1.24 mg/dL   GFR calc non Af Amer >60 >60 mL/min   GFR calc Af Amer >60 >60 mL/min    Comment: (NOTE) The eGFR has been calculated using the CKD EPI equation. This calculation has not been validated in all clinical situations. eGFR's persistently <60 mL/min signify possible Chronic Kidney Disease.   CBC     Status: Abnormal   Collection Time: 10/06/15  5:48 AM  Result Value Ref Range   WBC 9.8 3.8 - 10.6 K/uL   RBC 3.17 (L) 4.40 - 5.90 MIL/uL   Hemoglobin 9.4 (L) 13.0 - 18.0 g/dL   HCT 27.5 (L) 40.0 - 52.0 %   MCV 86.8 80.0 - 100.0 fL   MCH 29.7 26.0 - 34.0 pg   MCHC 34.2 32.0 - 36.0 g/dL   RDW 13.5 11.5 - 14.5 %   Platelets 664 (H) 150 - 440 K/uL  Sodium     Status: Abnormal   Collection Time: 10/06/15  5:48 AM  Result Value Ref Range   Sodium 131 (L) 135 - 145 mmol/L  Glucose, capillary     Status: Abnormal   Collection Time: 10/06/15  8:06 AM  Result Value Ref Range   Glucose-Capillary 106 (H) 65 - 99 mg/dL  Glucose, capillary     Status: None   Collection Time: 10/06/15 11:09 AM  Result Value Ref Range   Glucose-Capillary 82 65 - 99 mg/dL  Vancomycin, trough     Status: None   Collection Time: 10/06/15 11:20 AM  Result Value Ref Range   Vancomycin Tr 14 10 - 20 ug/mL  Glucose, capillary     Status: Abnormal   Collection Time: 10/06/15 12:32 PM  Result Value Ref Range   Glucose-Capillary 173 (H) 65 - 99 mg/dL  Glucose, capillary     Status: Abnormal   Collection Time: 10/06/15  4:45 PM  Result Value Ref  Range   Glucose-Capillary 221 (H) 65 - 99 mg/dL  Glucose, capillary     Status: Abnormal   Collection Time: 10/06/15  9:58 PM  Result Value Ref Range   Glucose-Capillary 258 (H) 65 - 99 mg/dL  Osmolality, urine     Status: None   Collection Time: 10/06/15  9:59 PM  Result Value Ref Range   Osmolality, Ur 360 300 - 900 mOsm/kg  Glucose, capillary     Status: None   Collection Time: 10/07/15  7:41 AM  Result Value  Ref Range   Glucose-Capillary 80 65 - 99 mg/dL  Glucose, capillary     Status: Abnormal   Collection Time: 10/07/15 11:32 AM  Result Value Ref Range   Glucose-Capillary 208 (H) 65 - 99 mg/dL  Glucose, capillary     Status: Abnormal   Collection Time: 10/07/15  4:48 PM  Result Value Ref Range   Glucose-Capillary 127 (H) 65 - 99 mg/dL    Current Facility-Administered Medications  Medication Dose Route Frequency Provider Last Rate Last Dose  . acetaminophen (TYLENOL) tablet 650 mg  650 mg Oral Q6H PRN Henreitta Leber, MD      . albuterol (PROVENTIL) (2.5 MG/3ML) 0.083% nebulizer solution 2.5 mg  2.5 mg Nebulization Q4H PRN Wilhelmina Mcardle, MD      . bisacodyl (DULCOLAX) EC tablet 10 mg  10 mg Oral Daily Nestor Lewandowsky, MD   10 mg at 10/07/15 1045  . diphenhydrAMINE (BENADRYL) capsule 25 mg  25 mg Oral Q8H PRN Dustin Flock, MD      . docusate sodium (COLACE) capsule 200 mg  200 mg Oral BID Dustin Flock, MD   200 mg at 10/07/15 1045  . enoxaparin (LOVENOX) injection 40 mg  40 mg Subcutaneous Q24H Henreitta Leber, MD   40 mg at 10/06/15 2103  . feeding supplement (GLUCERNA SHAKE) (GLUCERNA SHAKE) liquid 237 mL  237 mL Oral TID BM Henreitta Leber, MD   237 mL at 10/06/15 2015  . guaiFENesin-dextromethorphan (ROBITUSSIN DM) 100-10 MG/5ML syrup 5 mL  5 mL Oral Q4H PRN Henreitta Leber, MD   5 mL at 10/04/15 1150  . hydrocortisone cream 1 %   Topical BID Dustin Flock, MD      . insulin aspart (novoLOG) injection 0-15 Units  0-15 Units Subcutaneous TID WC Demetrios Loll, MD   2 Units at  10/07/15 1752  . insulin aspart (novoLOG) injection 0-5 Units  0-5 Units Subcutaneous QHS Demetrios Loll, MD   3 Units at 10/06/15 2235  . insulin aspart (novoLOG) injection 3 Units  3 Units Subcutaneous TID WC Theodoro Grist, MD   3 Units at 10/07/15 1752  . insulin glargine (LANTUS) injection 18 Units  18 Units Subcutaneous QHS Theodoro Grist, MD      . metoprolol (LOPRESSOR) injection 5 mg  5 mg Intravenous Q6H PRN Fritzi Mandes, MD   5 mg at 09/23/15 0404  . metoprolol tartrate (LOPRESSOR) tablet 25 mg  25 mg Oral BID Dustin Flock, MD   25 mg at 10/07/15 1046  . morphine 2 MG/ML injection 1 mg  1 mg Intravenous Q4H PRN Henreitta Leber, MD   1 mg at 10/02/15 2040  . nystatin (MYCOSTATIN) 100000 UNIT/ML suspension 500,000 Units  5 mL Oral QID Dustin Flock, MD   500,000 Units at 10/07/15 1407  . ondansetron (ZOFRAN) injection 4 mg  4 mg Intravenous Q6H PRN Nestor Lewandowsky, MD      . oxyCODONE-acetaminophen (PERCOCET/ROXICET) 5-325 MG per tablet 1 tablet  1 tablet Oral Q6H PRN Fritzi Mandes, MD   1 tablet at 10/07/15 1045  . senna (SENOKOT) tablet 8.6 mg  1 tablet Oral Daily Dustin Flock, MD   8.6 mg at 10/07/15 1046    Musculoskeletal: Strength & Muscle Tone: decreased Gait & Station: unable to stand Patient leans: N/A  Psychiatric Specialty Exam: Review of Systems  Unable to perform ROS: mental acuity    Blood pressure 138/70, pulse 81, temperature 98.7 F (37.1 C), temperature source Oral, resp. rate 18,  height 5' 5"  (1.651 m), weight 60.7 kg (133 lb 13.1 oz), SpO2 97 %.Body mass index is 22.27 kg/(m^2).  General Appearance: Casual  Eye Contact::  Minimal  Speech:  Garbled, Slow and Slurred  Volume:  Decreased  Mood:  Euthymic  Affect:  Constricted  Thought Process:  Disorganized  Orientation:  Negative  Thought Content:  Negative  Suicidal Thoughts:  Unknown but presumed to be negative  Homicidal Thoughts:  No  Memory:  Negative  Judgement:  Negative  Insight:  Negative  Psychomotor  Activity:  Decreased  Concentration:  Negative  Recall:  Negative  Fund of Knowledge:Negative  Language: Negative  Akathisia:  Negative  Handed:  Right  AIMS (if indicated):     Assets:  Social Support  ADL's:  Impaired  Cognition: Impaired,  Moderate  Sleep:      Treatment Plan Summary: Plan 50 year old man with chronic intellectual disability probably moderate or nearly so in severity. Functioning poorly at an intellectual capacity right now. Also may still be having some degree of delirium given his multiple medical problems. No evidence of depression. Staff believes that the patient does eat adequately there is no sign that he's been avoiding food. Notes indicate that he did cooperate with physical therapy today. I don't see any indication to add extra medicine at this point. I will try and come back and reevaluate again during the hospital stay but at this point don't see any reason to add psychiatric treatment.  Disposition: Patient does not meet criteria for psychiatric inpatient admission.  Alethia Berthold, MD 10/07/2015 8:04 PM

## 2015-10-07 NOTE — Progress Notes (Addendum)
Pt is alert, non english speaking, abdominal pain improved with prn medications, on room air, minimal drainage from jp/ chest tube, dressing over surgical sites changed, up to chair for one hour, returned to bed per patient request.On room air. Poor appetite, sips/ bites only, psych evaluated patient.

## 2015-10-07 NOTE — Progress Notes (Signed)
[employer] -  at [facility]   PATIENT NAME: Sean [name]    MR#:  [id number]  DATE OF BIRTH:  Mar 17, 1966  SUBJECTIVE:  Doing ok, wants to go home, no new complaints. Agitated intermittently pushes examiners away, promises to eat well and participated in physical therapy. Physical therapist recommends home health with 24-hour supervision. Social worker is working to place patient.   REVIEW OF SYSTEMS:    Review of Systems  Constitutional: Negative for fever and chills.  HENT: Negative for congestion and tinnitus.   Eyes: Negative for blurred vision and double vision.  Respiratory: Negative for cough, shortness of breath and wheezing.   Cardiovascular: Negative for chest pain, orthopnea and PND.  Gastrointestinal: Negative for nausea, vomiting, abdominal pain and diarrhea.  Genitourinary: Negative for dysuria and hematuria.  Neurological: Negative for dizziness, sensory change and focal weakness.  All other systems reviewed and are negative.   Nutrition: Carb control Tolerating Diet: Yes Tolerating PT: Evaluation noted    DRUG ALLERGIES:   Allergies  Allergen Reactions  . Penicillins Other (See Comments)    Reaction:  Unknown     VITALS:  Blood pressure 138/70, pulse 81, temperature 98.7 F (37.1 C), temperature source Oral, resp. rate 18, height  (1.651 m), weight 60.7 kg (133 lb 13.1 oz), SpO2 97 %.  PHYSICAL EXAMINATION:   Physical Exam  GENERAL:  50 y.o.-year-old patient lying in the bed in no acute distress.  EYES: Pupils equal, round, reactive to light and accommodation. No scleral icterus. Extraocular muscles intact.  HEENT: Head atraumatic, normocephalic. Oropharynx and nasopharynx clear.  NECK:  Supple, no jugular venous distention. No thyroid enlargement, no tenderness.  LUNGS: Normal breath sounds bilaterally, no wheezing, rales, rhonchi. No use of accessory muscles of respiration. Left-sided chest tube in place. CARDIOVASCULAR:  S1, S2 normal. No murmurs, rubs, or gallops.  ABDOMEN: Soft, nontender, nondistended. Bowel sounds present. No organomegaly or mass.  EXTREMITIES: No cyanosis, clubbing or edema b/l.    NEUROLOGIC: Cranial nerves II through XII are intact. No focal Motor or sensory deficits b/l.  Globally weak PSYCHIATRIC: The patient is alert and oriented x 1.  SKIN: No obvious rash, lesion, or ulcer.    LABORATORY PANEL:   CBC  Recent Labs Lab 10/06/15 0548  WBC 9.8  HGB 9.4*  HCT 27.5*  PLT 664*   ------------------------------------------------------------------------------------------------------------------  Chemistries   Recent Labs Lab 10/05/15 1334 10/06/15 0548  NA 131* 131*  K 4.5  --   CL 97*  --   CO2 29  --   GLUCOSE 188*  --   BUN 11  --   CREATININE 0.78 0.77  CALCIUM 8.3*  --    ------------------------------------------------------------------------------------------------------------------  Cardiac Enzymes No results for input(s): TROPONINI in the last 168 hours. ------------------------------------------------------------------------------------------------------------------  RADIOLOGY:  No results found.   ASSESSMENT AND PLAN:  50 year old male with past medical history of hypertension, diabetes, mental retardation who presented to the hospital with pneumonia and empyema.  1. Sepsis-this was secondary to left lower lobe pneumonia with loculated effusion/empyema. Now resolved -Patient is status post decortication and chest tube placement. -  -Afebrile, hemodynamically stable. -Cultures so far negative. Off all abx  2. Pneumonia with empyema-patient is status post left-sided thoracotomy and decortication. Postop day #7. -Appreciate surgical input. Off all antibiotics. Cultures so far negative and WBC count trending down, now normal.  - CXR from 4/15 showing improvement in effusion and no pneumothorax.  -Dr. Fredrik Cove to "empty atrium" in preparation  for "empyema tubes" on 4/18  3. Lactic acidosis-due to sepsis and pneumonia. Now resolved.  4. 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 -Sodium 131 yesterday. Initiate fluid restrictions. Recheck sodium level tomorrow  5. Hypokalemia- repleted.  6. Diabetes type 2 without complication-continue Lantus, sliding scale insulin. High blood sugars, advance Lantus, adding NovoLog 3 units with each meals.  7. Constipation-resolved.  Cont. Senna  Likely charged to skilled nursing facility due to need to continue chest tube  All the records are reviewed and case discussed with Care Management/Social Worker. Management plans discussed with the patient, family and they are in agreement.  CODE STATUS: Full  DVT Prophylaxis: Lovenox  TOTAL TIME TAKING CARE OF THIS PATIENT: 35 minutes.  Discussed with care management and social worker POSSIBLE D/C IN 1-2 days, DEPENDING ON CLINICAL CONDITION.     Katharina CaperVAICKUTE,Brandell Maready M.D on 10/07/2015 at 2:16 PM  Between 7am to 6pm - Pager - [phone]  After 6pm go to [email or web address] - password [email or web address] [facility]  [facility]  Office  [phone]  CC: Primary care physician; Emogene MorganAYCOCK, NGWE A, MD

## 2015-10-07 NOTE — Progress Notes (Signed)
Wounds are clean and dry.  Chest tubes draining serous fluid into Foley bag.  Thoracotomy wound is clean and dry.  No erythema.  No drainage recorded for JP so we can not assess when to remove this drain.  However, there is only serous drainage in the bulb now.  Will continue to follow.  Discharge is uncertain but I can manage his chest tubes as an outpatient but he will need once or twice per week clinic visits.  [employer]on [employer] [name].

## 2015-10-07 NOTE — Progress Notes (Signed)
Physical Therapy Treatment Patient Details Name: Sean [name] MRN: [id number] DOB: [birth year] Today's Date: 10/07/2015    History of Present Illness Presented to ER 4/4 secondary to abdominal pain, cough and SOB; admitted with sepsis related to PNA and L pleural effusion/loculated empyema.  Hospital course significant for pleurx catheter placement (4/6) with revision/replacement (4/7) and subsequent L chest thoracotomy and decortication (4/11)    PT Comments    Pt demonstrates some impulsivity with transfer today but is somewhat limited as well due to language barrier. Interpreter, [name] [phone], utilized throughout entire therapy session. Pt able to ambulate a full lap around RN station today but requires multiple standing rest breaks. Unable to obtain SaO2 from either hand so allowed to rest until he symptomatically improves. Toilet transfer performed with patient as well as standing balance for diaper change. Pt refuses again to participate with exercises after returning to room. Pt will benefit from skilled PT services to address deficits in strength, balance, and mobility in order to return to full function at home.    Follow Up Recommendations  Home health PT;Supervision/Assistance - 24 hour     Equipment Recommendations  Rolling walker with 5" wheels    Recommendations for Other Services       Precautions / Restrictions Precautions Precautions: Fall Precaution Comments: L chest tube (x3), L chest JP drain, CT to water seal for ambulation (per Dr. Thelma Barge order), no lifting >15 lbs x4 weeks, pain epidural in place Restrictions Weight Bearing Restrictions: No    Mobility  Bed Mobility Overal bed mobility: Needs Assistance Bed Mobility: Supine to Sit;Sit to Supine     Supine to sit: Min assist Sit to supine: Min guard   General bed mobility comments: Pt requires UE assist to right trunk from R sidelying to sitting  Transfers Overall transfer level: Needs assistance Equipment  used: Rolling walker (2 wheeled) Transfers: Sit to/from Stand Sit to Stand: Min guard         General transfer comment: Pt demonstrates good speed and sequencing with LE for sit to stand. Performed toilet transfer with patient as well as sit to stand from commode following toileting. Performed standing balance with patient while diapering  Ambulation/Gait Ambulation/Gait assistance: Min guard Ambulation Distance (Feet): 220 Feet Assistive device: Rolling walker (2 wheeled) Gait Pattern/deviations: Step-through pattern Gait velocity: Decreased Gait velocity interpretation: <1.8 ft/sec, indicative of risk for recurrent falls General Gait Details: Pt demonstrates decreased step length. and overall gait speed. He requires multiple standing rest breaks during ambualtion. He appears mildly dyspneic but no accessory muscle use. Unable to obtain SaO2 on either hand. Pt reports feeling fatigued. Continued to monitor throughout ambulation as well as provide rest breaks. Pt able to complete a full lap around RN station.   Stairs            Wheelchair Mobility    Modified Rankin (Stroke Patients Only)       Balance Overall balance assessment: Needs assistance Sitting-balance support: No upper extremity supported Sitting balance-Leahy Scale: Fair     Standing balance support: No upper extremity supported Standing balance-Leahy Scale: Fair                      Cognition Arousal/Alertness: Awake/alert Behavior During Therapy: WFL for tasks assessed/performed Overall Cognitive Status: Difficult to assess                      Exercises      General Comments  Pertinent Vitals/Pain Pain Assessment: 0-10 Pain Score: 8  Pain Location: L chest/flank Pain Descriptors / Indicators: Grimacing Pain Intervention(s): Monitored during session    Home Living                      Prior Function            PT Goals (current goals can now be found  in the care plan section) Acute Rehab PT Goals PT Goal Formulation: Patient unable to participate in goal setting (patient eager for "discharge") Time For Goal Achievement: 10/13/15 Potential to Achieve Goals: Good Progress towards PT goals: Progressing toward goals    Frequency  Min 2X/week    PT Plan Current plan remains appropriate    Co-evaluation             End of Session Equipment Utilized During Treatment: Gait belt Activity Tolerance: Patient tolerated treatment well Patient left: in bed;with call bell/phone within reach;with bed alarm set;Other (comment) (Was up in recliner earlier today)     Time: 1630-1700 PT Time Calculation (min) (ACUTE ONLY): 30 min  Charges:  $Gait Training: 8-22 mins $Therapeutic Activity: 8-22 mins                    G Codes:      Sharalyn InkJason D Oluwateniola Leitch PT, DPT   [place] 10/07/2015, 5:14 PM

## 2015-10-08 ENCOUNTER — Inpatient Hospital Stay: Payer: Medicaid Other

## 2015-10-08 LAB — SODIUM: Sodium: 136 mmol/L (ref 135–145)

## 2015-10-08 LAB — GLUCOSE, CAPILLARY
GLUCOSE-CAPILLARY: 214 mg/dL — AB (ref 65–99)
GLUCOSE-CAPILLARY: 183 mg/dL — AB (ref 65–99)
Glucose-Capillary: 171 mg/dL — ABNORMAL HIGH (ref 65–99)
Glucose-Capillary: 180 mg/dL — ABNORMAL HIGH (ref 65–99)
Glucose-Capillary: 318 mg/dL — ABNORMAL HIGH (ref 65–99)

## 2015-10-08 NOTE — Clinical Social Work Note (Signed)
Clinical Social Work Assessment  Patient Details  Name: Sean [name] MRN: [id number] Date of Birth: 04-30-1966  Date of referral:  10/08/15               Reason for consult:  Facility Placement                Permission sought to share information with:  Family Supports Permission granted to share information::  Yes, Verbal Permission Granted  Name::     Mr. Soulliere, brother   Housing/Transportation Living arrangements for the past 2 months:  Single Family Home Source of Information:  Other (Comment Required) (oldest brother) Patient Interpreter Needed:  Other (Comment Required) (Arabic, however brother speaks [place]) Criminal Activity/Legal Involvement Pertinent to Current Situation/Hospitalization:  No - Comment as needed Significant Relationships:  Siblings, Parents Lives with:  Parents, Siblings Do you feel safe going back to the place where you live?  Yes Need for family participation in patient care:  Yes (Comment)  Care giving concerns:  Pt's brother is concerned that the family is unable to manage chest tubes in the home.   Social Worker assessment / plan:  CSW spoke with pt's sister, who answered the phone and shared that pt can come home, however pt's oldest brother, Mr. Kilgore will be coming to the hospital.   CSW met with pt's older brother, Mr. Stern, to address consult. Mr. Cottingham has been designated by the family to be the decision maker. Mr. Lorusso is proficient in [place]. Mr. Clausing lives in [place] and will be here until May 7. CSW introduced herself and explained role of social work. CSW also explained process of discharging to SNF with Medicaid. Pt currently has chest tubes, and facilities are unable to accept a pt with these, even though pt will be going to see Dr. Faith Rogue outpatient twice a week. Mr. Crable is concerned if the family is able to manage them at home. Mr. Hagood stated that should the chest tubes come out, they would have no issue taking him home. Mr.  Wain plans to speak with his family about pt's care at home. CSW explained home health can come out once to provide teaching of the care of the chest tubes. Mr. Vaquera shared that the concern is that pt may pull them out by accident. Mr. Wiegand also shared that pt lived with a brother up until recently, when his brother passed away unexpectedly and pt has had a decline in health since then. Disposition is pending at time of note. CSW will continue to follow.   Employment status:  Disabled (Comment on whether or not currently receiving Disability) Insurance information:  Medicaid In [place] PT Recommendations:  Home with [place], 24 Hour Supervision Information / Referral to community resources:  Other (Comment Required)  Patient/Family's Response to care:  Pt's brother was appreciative of CSW support.   Patient/Family's Understanding of and Emotional Response to Diagnosis, Current Treatment, and Prognosis:  Mr. Ermis will be speaking with family to address discharge plan.   Emotional Assessment Appearance:  Appears stated age Attitude/Demeanor/Rapport:  Avoidant Affect (typically observed):  Flat Orientation:  Oriented to Self, Fluctuating Orientation (Suspected and/or reported Sundowners) Alcohol / Substance use:  Never Used Psych involvement (Current and /or in the community):  Yes (Comment)  Discharge Needs  Concerns to be addressed:  Adjustment to Illness, Cognitive Concerns Readmission within the last 30 days:  No Current discharge risk:  None Barriers to Discharge:  Other (chest tubes, which SNFs are not  able to manage)   Darden Dates, LCSW 10/08/2015, 5:21 PM

## 2015-10-08 NOTE — Consult Note (Signed)
[facility]   Reason for Consult:  Follow-up consult for this 50 year old man with moderate level intellectual disability currently in the hospital recovering from pneumonia and empyema. Referring Physician:  Winona LegatoVaickute Patient Identification: Sean [name] MRN:  [id number] Principal Diagnosis: Intellectual disability Diagnosis:   Patient Active Problem List   Diagnosis Date Noted  . Intellectual disability [F79] 10/07/2015  . Empyema (HCC) [J86.9]   . Sepsis (HCC) [A41.9] 09/22/2015  . Pneumonia [J18.9]   . Hyponatremia [E87.1]     Total Time spent with patient: 30 minutes  Subjective:   Sean [name] is a 50 y.o. male patient admitted with patient himself did not give any information. Fortunately his older brother was present who spoke [place] fluently and was able to give very useful information.Marland Kitchen.  HPI:  50 year old man with intellectual disability. I tried talking with him yesterday with an Urdu interpreter with very limited success. Today his brother was able to tell me that even at baseline the patient's level of communication is pretty limited. From the way he describes it it sounds like he probably falls into the daughter level of intellectual disability. The brother was able to tell me that he thinks that mentally the patient seems like his normal self. He said he did not see anything that made him suspect that there was depression. He was able to shed some light on some of the patient's behavior. He said that the patient told him that he was hesitant to eat because he was afraid it would cause him to have a bowel movement and he felt ashamed to have bowel movements and be cleaned by the nurses. It does seem like he is eating more appropriately and has been more physically active and is certainly not impeding treatment.  Past Psychiatric History: Chronic intellectual disability. Sounds like he's permanently dependent on family members. From the way he describes his  behavior he sounds like he is fairly childlike at baseline.  Risk to Self: Is patient at risk for suicide?: No Risk to Others:   Prior Inpatient Therapy:   Prior Outpatient Therapy:    Past Medical History:  Past Medical History  Diagnosis Date  . Diabetes mellitus without complication (HCC)   . Hypertension   . Mentally disabled     Past Surgical History  Procedure Laterality Date  . Video bronchoscopy N/A 09/28/2015    Procedure: VIDEO BRONCHOSCOPY;  Surgeon: Hulda Marinimothy Oaks, MD;  Location: [facility] ORS;  Service: Thoracic;  Laterality: N/A;  . Thoracotomy Left 09/28/2015    Procedure: THORACOTOMY MAJOR WITH DECORTICATION;  Surgeon: Hulda Marinimothy Oaks, MD;  Location: [facility] ORS;  Service: Thoracic;  Laterality: Left;   Family History:  Family History  Problem Relation Age of Onset  . Hypertension Mother   . Diabetes Mother   . Hypertension Father   . Diabetes Father    Family Psychiatric  History: Nothing known or identified Social History:  History  Alcohol Use No     History  Drug Use Not on file    Social History   Social History  . Marital Status: Single    Spouse Name: N/A  . Number of Children: N/A  . Years of Education: N/A   Social History Main Topics  . Smoking status: Never Smoker   . Smokeless tobacco: None     Comment: no passive smoke in home  . Alcohol Use: No  . Drug Use: None  . Sexual Activity: Not Asked   Other Topics Concern  . None  Social History Narrative   Additional Social History:    Allergies:   Allergies  Allergen Reactions  . Penicillins Other (See Comments)    Reaction:  Unknown     Labs:  Results for orders placed or performed during the hospital encounter of 09/21/15 (from the past 48 hour(s))  Glucose, capillary     Status: Abnormal   Collection Time: 10/06/15  9:58 PM  Result Value Ref Range   Glucose-Capillary 258 (H) 65 - 99 mg/dL  Osmolality, urine     Status: None   Collection Time: 10/06/15  9:59 PM  Result Value Ref  Range   Osmolality, Ur 360 300 - 900 mOsm/kg  Glucose, capillary     Status: None   Collection Time: 10/07/15  7:41 AM  Result Value Ref Range   Glucose-Capillary 80 65 - 99 mg/dL  Glucose, capillary     Status: Abnormal   Collection Time: 10/07/15 11:32 AM  Result Value Ref Range   Glucose-Capillary 208 (H) 65 - 99 mg/dL  Glucose, capillary     Status: Abnormal   Collection Time: 10/07/15  4:48 PM  Result Value Ref Range   Glucose-Capillary 127 (H) 65 - 99 mg/dL  Glucose, capillary     Status: Abnormal   Collection Time: 10/07/15  9:37 PM  Result Value Ref Range   Glucose-Capillary 150 (H) 65 - 99 mg/dL  Sodium     Status: None   Collection Time: 10/08/15  6:20 AM  Result Value Ref Range   Sodium 136 135 - 145 mmol/L  Glucose, capillary     Status: Abnormal   Collection Time: 10/08/15  7:25 AM  Result Value Ref Range   Glucose-Capillary 171 (H) 65 - 99 mg/dL  Glucose, capillary     Status: Abnormal   Collection Time: 10/08/15 11:09 AM  Result Value Ref Range   Glucose-Capillary 214 (H) 65 - 99 mg/dL  Glucose, capillary     Status: Abnormal   Collection Time: 10/08/15 12:09 PM  Result Value Ref Range   Glucose-Capillary 183 (H) 65 - 99 mg/dL  Glucose, capillary     Status: Abnormal   Collection Time: 10/08/15  4:31 PM  Result Value Ref Range   Glucose-Capillary 180 (H) 65 - 99 mg/dL    Current Facility-Administered Medications  Medication Dose Route Frequency Provider Last Rate Last Dose  . acetaminophen (TYLENOL) tablet 650 mg  650 mg Oral Q6H PRN Houston Siren, MD      . albuterol (PROVENTIL) (2.5 MG/3ML) 0.083% nebulizer solution 2.5 mg  2.5 mg Nebulization Q4H PRN Merwyn Katos, MD      . bisacodyl (DULCOLAX) EC tablet 10 mg  10 mg Oral Daily Hulda Marin, MD   10 mg at 10/08/15 1610  . diphenhydrAMINE (BENADRYL) capsule 25 mg  25 mg Oral Q8H PRN Auburn Bilberry, MD      . docusate sodium (COLACE) capsule 200 mg  200 mg Oral BID Auburn Bilberry, MD   200 mg at  10/08/15 0937  . enoxaparin (LOVENOX) injection 40 mg  40 mg Subcutaneous Q24H Houston Siren, MD   40 mg at 10/07/15 2325  . feeding supplement (GLUCERNA SHAKE) (GLUCERNA SHAKE) liquid 237 mL  237 mL Oral TID BM Houston Siren, MD   237 mL at 10/08/15 1359  . guaiFENesin-dextromethorphan (ROBITUSSIN DM) 100-10 MG/5ML syrup 5 mL  5 mL Oral Q4H PRN Houston Siren, MD   5 mL at 10/04/15 1150  . hydrocortisone cream  1 %   Topical BID Auburn Bilberry, MD      . insulin aspart (novoLOG) injection 0-15 Units  0-15 Units Subcutaneous TID WC Shaune Pollack, MD   3 Units at 10/08/15 1644  . insulin aspart (novoLOG) injection 0-5 Units  0-5 Units Subcutaneous QHS Shaune Pollack, MD   3 Units at 10/06/15 2235  . insulin aspart (novoLOG) injection 3 Units  3 Units Subcutaneous TID WC Katharina Caper, MD   3 Units at 10/08/15 1643  . insulin glargine (LANTUS) injection 18 Units  18 Units Subcutaneous QHS Katharina Caper, MD   18 Units at 10/07/15 2329  . metoprolol (LOPRESSOR) injection 5 mg  5 mg Intravenous Q6H PRN Enedina Finner, MD   5 mg at 09/23/15 0404  . metoprolol tartrate (LOPRESSOR) tablet 25 mg  25 mg Oral BID Auburn Bilberry, MD   25 mg at 10/08/15 0937  . morphine 2 MG/ML injection 1 mg  1 mg Intravenous Q4H PRN Houston Siren, MD   1 mg at 10/02/15 2040  . nystatin (MYCOSTATIN) 100000 UNIT/ML suspension 500,000 Units  5 mL Oral QID Auburn Bilberry, MD   500,000 Units at 10/08/15 1644  . ondansetron (ZOFRAN) injection 4 mg  4 mg Intravenous Q6H PRN Hulda Marin, MD   4 mg at 10/07/15 2326  . oxyCODONE-acetaminophen (PERCOCET/ROXICET) 5-325 MG per tablet 1 tablet  1 tablet Oral Q6H PRN Enedina Finner, MD   1 tablet at 10/07/15 1045  . senna (SENOKOT) tablet 8.6 mg  1 tablet Oral Daily Auburn Bilberry, MD   8.6 mg at 10/08/15 1610    Musculoskeletal: Strength & Muscle Tone: decreased Gait & Station: unsteady Patient leans: N/A  Psychiatric Specialty Exam: Review of Systems  Unable to perform ROS: mental acuity     Blood pressure 107/63, pulse 100, temperature 98.1 F (36.7 C), temperature source Oral, resp. rate 20, height  (1.651 m), weight 60.7 kg (133 lb 13.1 oz), SpO2 97 %.Body mass index is 22.27 kg/(m^2).  General Appearance: Casual  Eye Contact::  Minimal  Speech:  Garbled and Slurred  Volume:  Decreased  Mood:  Euthymic  Affect:  Appropriate  Thought Process:  Circumstantial  Orientation:  Negative  Thought Content:  Negative  Suicidal Thoughts:  No  Homicidal Thoughts:  No  Memory:  Negative  Judgement:  Negative  Insight:  Negative  Psychomotor Activity:  Decreased  Concentration:  Poor  Recall:  Poor  Fund of Knowledge:Poor  Language: Poor  Akathisia:  No  Handed:  Right  AIMS (if indicated):     Assets:  Social Support  ADL's:  Impaired  Cognition: Impaired,  Moderate  Sleep:      Treatment Plan Summary: Plan After speaking with the brother and also speaking with nursing and social work today I felt more reassured that this does not seem to be a case of depression or any other mental health problem. It sounds like this patient with pretty significant intellectual disability is probably doing the best he can under the circumstances. Brother thought that the type of food offered to the patient probably didn't make much difference. He did offer the insight about the patient being ashamed of having bowel movements in the hospital. Everyone acknowledge that the patient would likely do much better at home with his family all around him in a familiar environment. It sounds like the focus of treatment at this point is to try and either get to where the chest tube can be taken  out or find some way to get him discharged home with it. There does not seem to be any indication for psychiatric intervention further. I would be happy to reassess at something new comes up but at this point things seem to be stable.  Disposition: Supportive therapy provided about ongoing stressors.  Mordecai Rasmussen, MD 10/08/2015 6:25 PM

## 2015-10-08 NOTE — Progress Notes (Signed)
[employer] [facility] [place] [facility]   PATIENT NAME: Sean [name]    MR#:  [id number]  DATE OF BIRTH:  01/03/1966  SUBJECTIVE:  Doing ok, wants to go home, no new complaints.  Physical therapist recommends home health with 24-hour supervision. Social worker is working to place patient per family request. Dr. Thelma Bargeaks removed JP drain today, he is planning to remove her chest tubes over the next few weeks, could be done as outpatient per Dr. Thelma Bargeaks note   REVIEW OF SYSTEMS:    Review of Systems  Constitutional: Negative for fever and chills.  HENT: Negative for congestion and tinnitus.   Eyes: Negative for blurred vision and double vision.  Respiratory: Negative for cough, shortness of breath and wheezing.   Cardiovascular: Negative for chest pain, orthopnea and PND.  Gastrointestinal: Negative for nausea, vomiting, abdominal pain and diarrhea.  Genitourinary: Negative for dysuria and hematuria.  Neurological: Negative for dizziness, sensory change and focal weakness.  All other systems reviewed and are negative.   Nutrition: Carb control Tolerating Diet: Yes Tolerating PT: Evaluation noted    DRUG ALLERGIES:   Allergies  Allergen Reactions  . Penicillins Other (See Comments)    Reaction:  Unknown     VITALS:  Blood pressure 107/63, pulse 100, temperature 98.1 F (36.7 C), temperature source Oral, resp. rate 20, height 5\' 5"  (1.651 m), weight 60.7 kg (133 lb 13.1 oz), SpO2 97 %.  PHYSICAL EXAMINATION:   Physical Exam  GENERAL:  50 y.o.-year-old patient lying in the bed in no acute distress.  EYES: Pupils equal, round, reactive to light and accommodation. No scleral icterus. Extraocular muscles intact.  HEENT: Head atraumatic, normocephalic. Oropharynx and nasopharynx clear.  NECK:  Supple, no jugular venous distention. No thyroid enlargement, no tenderness.  LUNGS: Normal breath sounds bilaterally, no wheezing, rales, rhonchi. No use of accessory muscles of  respiration. Left-sided chest tube in place. CARDIOVASCULAR: S1, S2 normal. No murmurs, rubs, or gallops.  ABDOMEN: Soft, nontender, nondistended. Bowel sounds present. No organomegaly or mass.  EXTREMITIES: No cyanosis, clubbing or edema b/l.    NEUROLOGIC: Cranial nerves II through XII are intact. No focal Motor or sensory deficits b/l.  Globally weak PSYCHIATRIC: The patient is alert and oriented x 1.  SKIN: No obvious rash, lesion, or ulcer.    LABORATORY PANEL:   CBC  Recent Labs Lab 10/06/15 0548  WBC 9.8  HGB 9.4*  HCT 27.5*  PLT 664*   ------------------------------------------------------------------------------------------------------------------  Chemistries   Recent Labs Lab 10/05/15 1334 10/06/15 0548 10/08/15 0620  NA 131* 131* 136  K 4.5  --   --   CL 97*  --   --   CO2 29  --   --   GLUCOSE 188*  --   --   BUN 11  --   --   CREATININE 0.78 0.77  --   CALCIUM 8.3*  --   --    ------------------------------------------------------------------------------------------------------------------  Cardiac Enzymes No results for input(s): TROPONINI in the last 168 hours. ------------------------------------------------------------------------------------------------------------------  RADIOLOGY:  Dg Chest 2 View  10/08/2015  CLINICAL DATA:  Postop common left chest tubes EXAM: CHEST  2 VIEW COMPARISON:  10/05/2015 FINDINGS: Left chest tubes remain in place. No pneumothorax. Left base atelectasis. Right lung is clear. Heart is normal size. IMPRESSION: Left chest tubes remain in place without visible pneumothorax. Left base atelectasis. Electronically Signed   By: Charlett NoseKevin  Dover M.D.   On: 10/08/2015 10:50     ASSESSMENT AND  PLAN:  50 year old male with past medical history of hypertension, diabetes, mental retardation who presented to the hospital with pneumonia and empyema.  1. Sepsis-this was secondary to left lower lobe pneumonia with loculated  effusion/empyema. Now resolved -Patient is status post decortication and chest tube placement. -  -Afebrile, hemodynamically stable. -Cultures so far negative. Off all abx  2. Pneumonia with empyema-patient is status post left-sided thoracotomy and decortication. Postop day #7. -Appreciate surgical input. Off all antibiotics. Cultures so far negative and WBC count trending down, now normal.  - CXR from 4/15 showing improvement in effusion and no pneumothorax.  -Dr. Fredrik Cove to "empty atrium" in preparation for "empyema tubes" on 4/18  3. Lactic acidosis-due to sepsis and pneumonia. Now resolved.  4. 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 -Sodium 136 today on fluid restrictions. Follow sodium level periodically  5. Hypokalemia- repleted.  6. Diabetes type 2 without complication-continue Lantus, sliding scale insulin. High blood sugars, advance Lantus even higher today, adding NovoLog 3 units with each meals.  7. Constipation-resolved.  Cont. Senna  Likely discharge to skilled nursing facility due to need to continue chest tube  All the records are reviewed and case discussed with Care Management/Social Worker. Management plans discussed with the patient, family and they are in agreement.  CODE STATUS: Full  DVT Prophylaxis: Lovenox  TOTAL TIME TAKING CARE OF THIS PATIENT: 25 minutes.  Discussed with care management and social worker POSSIBLE D/C IN 1-2 days, DEPENDING ON CLINICAL CONDITION.     Katharina Caper M.D on 10/08/2015 at 3:21 PM  Between 7am to 6pm - Pager - [phone]  After 6pm go to [email or web address] - password [email or web address] [facility]  [place] [facility]  Office  [phone]  CC: Primary care physician; Emogene Morgan, MD

## 2015-10-08 NOTE — Progress Notes (Signed)
JP drained 15 cc last 24 hours.  We will remove JP and staples along incision.  We will begin slowly withdrawing his chest tubes beginning next week.  I anticipate that we will do this Tuesday and Friday for 4 to 5 weeks until the chest tubes are completely removed.  I can do this as an outpatient.

## 2015-10-08 NOTE — Progress Notes (Signed)
Alert, appetite improved since previous shift, drinking Glucerna, up to bsc with minimal assist, JP drain removed, minimal output through chest tube, family at bedside in evening, bm throughout shift, no prn narcotics administered during shift. Pt is pleasant.

## 2015-10-09 LAB — CBC
HEMATOCRIT: 28.7 % — AB (ref 40.0–52.0)
Hemoglobin: 9.6 g/dL — ABNORMAL LOW (ref 13.0–18.0)
MCH: 28.7 pg (ref 26.0–34.0)
MCHC: 33.6 g/dL (ref 32.0–36.0)
MCV: 85.3 fL (ref 80.0–100.0)
PLATELETS: 662 10*3/uL — AB (ref 150–440)
RBC: 3.36 MIL/uL — ABNORMAL LOW (ref 4.40–5.90)
RDW: 13.6 % (ref 11.5–14.5)
WBC: 7.6 10*3/uL (ref 3.8–10.6)

## 2015-10-09 LAB — CREATININE, SERUM
CREATININE: 0.79 mg/dL (ref 0.61–1.24)
GFR calc Af Amer: >60 mL/min (ref >60)
GFR calc non Af Amer: >60 mL/min (ref >60)

## 2015-10-09 LAB — GLUCOSE, CAPILLARY
Glucose-Capillary: 85 mg/dL (ref 65–99)
Glucose-Capillary: 132 mg/dL — ABNORMAL HIGH (ref 65–99)

## 2015-10-09 MED ORDER — OXYCODONE-ACETAMINOPHEN 5-325 MG PO TABS: 1.0000 | ORAL_TABLET | Freq: Four times a day (QID) | ORAL | Status: DC | PRN

## 2015-10-09 NOTE — Progress Notes (Signed)
11 Days Post-Op   Subjective:  Patient seen today for Dr. Inez Catalinaakes. Patient has no complaints.  Vital signs in last 24 hours: Temp:  [97.5 F (36.4 C)-98.1 F (36.7 C)] 97.5 F (36.4 C) (04/22 0512) Pulse Rate:  [97-110] 97 (04/22 0825) Resp:  [17-21] 21 (04/22 0825) BP: (107-141)/(63-75) 107/73 mmHg (04/22 0825) SpO2:  [97 %-99 %] 99 % (04/22 0512) Last BM Date: 10/08/15  Intake/Output from previous day: 04/21 0701 - 04/22 0700 In: 720 [P.O.:720] Out: 860 [Urine:750; Chest Tube:110]  Physical exam: Gen.: No acute distress Chest: Left-sided drainage tubes in the left chest in place. No spreading erythema or purulence noted from the insertion sites. Minimal fluid noted within the Foley bag Heart: Regular rate and rhythm GI: soft, non-tender; bowel sounds normal; no masses,  no organomegaly  Lab Results:  CBC  Recent Labs  10/09/15 0426  WBC 7.6  HGB 9.6*  HCT 28.7*  PLT 662*   CMP     Component Value Date/Time   NA 136 10/08/2015 0620   K 4.5 10/05/2015 1334   CL 97* 10/05/2015 1334   CO2 29 10/05/2015 1334   GLUCOSE 188* 10/05/2015 1334   BUN 11 10/05/2015 1334   CREATININE 0.79 10/09/2015 0426   CALCIUM 8.3* 10/05/2015 1334   PROT 5.6* 09/29/2015 0449   ALBUMIN 1.8* 09/29/2015 0449   AST 31 09/29/2015 0449   ALT 19 09/29/2015 0449   ALKPHOS 57 09/29/2015 0449   BILITOT 0.7 09/29/2015 0449   GFRNONAA >60 10/09/2015 0426   GFRAA >60 10/09/2015 0426   PT/INR No results for input(s): LABPROT, INR in the last 72 hours.  Studies/Results: Dg Chest 2 View  10/08/2015  CLINICAL DATA:  Postop common left chest tubes EXAM: CHEST  2 VIEW COMPARISON:  10/05/2015 FINDINGS: Left chest tubes remain in place. No pneumothorax. Left base atelectasis. Right lung is clear. Heart is normal size. IMPRESSION: Left chest tubes remain in place without visible pneumothorax. Left base atelectasis. Electronically Signed   By: Charlett NoseKevin  Dover M.D.   On: 10/08/2015 10:50     Assessment/Plan: 50 year old male with multiple left-sided chest tubes. No changes today. Continue chest tube to gravity for gradual withdrawing starting next week per Dr. Thelma Bargeaks.   Ricarda Frameharles Susy Placzek, MD FACS General Surgeon  10/09/2015

## 2015-10-09 NOTE — Progress Notes (Signed)
Received MD order to discharge patient to home, changed dressing on chest tube site. Received discharge instructions home meds and prescriptions with brother and he verbalized understanding. Discharge in wheelchair with nursing and brother to home

## 2015-10-09 NOTE — Progress Notes (Addendum)
[employer] - [place] at [facility]   PATIENT NAME: Sean [name]    MR#:  [id number]  DATE OF BIRTH:  [birth year]  SUBJECTIVE:   Still wants to go home. Still complaining of some pain near the chest tube site.  No other acute events overnight. Social worker working on [facility] facility placement.  REVIEW OF SYSTEMS:    Review of Systems  Constitutional: Negative for fever and chills.  HENT: Negative for congestion and tinnitus.   Eyes: Negative for blurred vision and double vision.  Respiratory: Negative for cough, shortness of breath and wheezing.   Cardiovascular: Negative for chest pain, orthopnea and PND.  Gastrointestinal: Negative for nausea, vomiting, abdominal pain and diarrhea.  Genitourinary: Negative for dysuria and hematuria.  Neurological: Negative for dizziness, sensory change and focal weakness.  All other systems reviewed and are negative.   Nutrition: Carb control Tolerating Diet: Yes Tolerating PT: Evaluation noted    DRUG ALLERGIES:   Allergies  Allergen Reactions  . Penicillins Other (See Comments)    Reaction:  Unknown     VITALS:  Blood pressure 132/73, pulse 98, temperature 98.6 F (37 C), temperature source Oral, resp. rate 21, height 5\' 5"  (1.651 m), weight 60.7 kg (133 lb 13.1 oz), SpO2 98 %.  PHYSICAL EXAMINATION:   Physical Exam  GENERAL:  50 y.o.-year-old patient lying in the bed in no acute distress.  EYES: Pupils equal, round, reactive to light and accommodation. No scleral icterus. Extraocular muscles intact.  HEENT: Head atraumatic, normocephalic. Oropharynx and nasopharynx clear.  NECK:  Supple, no jugular venous distention. No thyroid enlargement, no tenderness.  LUNGS: Normal breath sounds bilaterally, no wheezing, rales, rhonchi. No use of accessory muscles of respiration. Left-sided chest tube in place. CARDIOVASCULAR: S1, S2 normal. No murmurs, rubs, or gallops.  ABDOMEN: Soft, nontender, nondistended.  Bowel sounds present. No organomegaly or mass.  EXTREMITIES: No cyanosis, clubbing or edema b/l.    NEUROLOGIC: Cranial nerves II through XII are intact. No focal Motor or sensory deficits b/l.  Globally weak PSYCHIATRIC: The patient is alert and oriented x 1.  SKIN: No obvious rash, lesion, or ulcer.    LABORATORY PANEL:   CBC  Recent Labs Lab 10/09/15 0426  WBC 7.6  HGB 9.6*  HCT 28.7*  PLT 662*   ------------------------------------------------------------------------------------------------------------------  Chemistries   Recent Labs Lab 10/05/15 1334  10/08/15 0620 10/09/15 0426  NA 131*  < > 136  --   K 4.5  --   --   --   CL 97*  --   --   --   CO2 29  --   --   --   GLUCOSE 188*  --   --   --   BUN 11  --   --   --   CREATININE 0.78  < >  --  0.79  CALCIUM 8.3*  --   --   --   < > = values in this interval not displayed. ------------------------------------------------------------------------------------------------------------------  Cardiac Enzymes No results for input(s): TROPONINI in the last 168 hours. ------------------------------------------------------------------------------------------------------------------  RADIOLOGY:  Dg Chest 2 View  10/08/2015  CLINICAL DATA:  Postop common left chest tubes EXAM: CHEST  2 VIEW COMPARISON:  10/05/2015 FINDINGS: Left chest tubes remain in place. No pneumothorax. Left base atelectasis. Right lung is clear. [name] is normal size. IMPRESSION: Left chest tubes remain in place without visible pneumothorax. Left base atelectasis. Electronically Signed   By: Charlett NoseKevin  Dover M.D.   On:  10/08/2015 10:50     ASSESSMENT AND PLAN:  50 year old male with past medical history of hypertension, diabetes, mental retardation who presented to the hospital with pneumonia and empyema.  1. Sepsis-this was secondary to left lower lobe pneumonia with loculated effusion/empyema. Now resolved -Patient is status post decortication and  chest tube placement.  -Afebrile, hemodynamically stable and off all abx now.    2. Pneumonia with empyema-patient is status post left-sided thoracotomy and decortication. -Appreciate surgical input. Off all antibiotics. Cultures so far negative and WBC count normal. -Dr. Fredrik Cove to "empty atrium" in preparation for "empyema tubes". We will continue to withdraw tube slowly over the next few weeks as per Dr. Thelma Barge. This is to be done as an outpatient.  3. Lactic acidosis-due to sepsis and pneumonia. Now resolved.  4. 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  5. Hypokalemia- repleted.  6. Diabetes type 2 without complication-continue Lantus, novolog with meals & sliding scale insulin. - BS table.   7. Constipation-resolved.  Cont. Senna  Pt. To be discharged home today.  Appreciate Social work help in speaking with family.    All the records are reviewed and case discussed with Care Management/Social Worker. Management plans discussed with the patient, family and they are in agreement.  CODE STATUS: Full  DVT Prophylaxis: Lovenox  TOTAL TIME TAKING CARE OF THIS PATIENT: 25 minutes.   POSSIBLE D/C IN 1-2 days, DEPENDING ON CLINICAL CONDITION.     Houston Siren M.D on 10/09/2015 at 12:45 PM  Between 7am to 6pm - Pager - [phone]  After 6pm go to [email or web address] - password [email or web address] [facility]  [place] [facility]  Office  [phone]  CC: Primary care physician; Emogene Morgan, MD

## 2015-10-09 NOTE — Clinical Social Work Note (Addendum)
CSW spoke with physician to update him regarding the plan that the CSW yesterday had discussed with the patient's older brother. Physician was discharging patient home today and spoke to the patient's younger brother: Jakhari Space via phone regarding discharge. CSW received call from nursing on 1C stating patient's family was irate and demanding to see a Child psychotherapist. CSW informed that I would be able to come down in a few minutes. CSW received a call moments later from nursing stating that the family was stating that they have things to do today and need to know a specific time. CSW informed that I would do my best to get down to speak with them in under 30 minutes. CSW went down to speak with patient's brothers: the older brother and the brother that resides and cares for the patient at home Louis Meckel). It was Leighton Roach that was most irate and was stating in a loud voice that he does not know how patient could be discharged and that patient has a chest tube and patient has blood sugar issues and that he will not be there with patient all the time and that the other family members in the home are elderly. CSW was able to speak with Dr. Tonita Cong (covering for Dr. Thelma Barge) and he informed CSW that patient would follow up in Dr Blenda Mounts office twice a week for the chest tube management. Patient would not need home health. Leighton Roach would not allow CSW to speak and informed me he would keep interrupting me. Eventually, patient's older brother spoke up and told his younger brother to stop. After he calmed down, CSW was able to explain that patient's chest tube would be managed by Dr. Thelma Barge as an outpatient and that he would see him twice a week in the office. Leighton Roach stated that patient has blood sugar issues (which CSW verified was a chronic condition and not a new issue) and that he was concerned that patient's chest tube site not being able to heal appropriately if that continued. CSW offered two choices. Patient could return home  in the care of family (patient's older brother had stated he would stay in the country for another 15 days and would be with patient that entire time) or that CSW could pursue placement for patient in a nursing home facility. CSW explained that finding a nursing home placement for patient would involve finding a facility that will accept medicaid and chest tube and that there are no local facilities that will currently accept medicaid and that we would have to do an out of county search. At mention of this, both brothers stated that this would not be good because patient's parents would want to see patient and could not if that happened. Then Leighton Roach asked would medicaid cover everything patient would need at nursing home and CSW explained that it covers for medications and room and board and that facilities will sometimes include rehab but they will not provide it often. CSW explained that the money patient gets every month would no longer come to the home but would go to the facility for payment. Upon hearing about the having to be placed out of county and patient's check having to go the facility each month, both brothers agreed that they wanted to take patient home. CSW asked again if they wanted to reconsider and CSW would work on placement but both stated that they wanted to proceed with taking patient home. They asked what to do if something went wrong with patient and  CSW advised to call the doctor or bring patient to the emergency room if necessary. Leighton Roachqbal stated that if they do end up having to bring patient back to the hospital that they will then pursue the placement option. Dr. Cherlynn KaiserSainani updated and patient's nurse updated.  York SpanielMonica Shauntea Lok MSW,LCSW [phone]

## 2015-10-09 NOTE — Care Management Note (Signed)
Case Management Note  Patient Details  Name: Sean [name] MRN: [id number] Date of Birth: 1965-12-27  Subjective/Objective:    Discussed discharge planning with [facility]-SW. [name] will be returning home with his family after discharge. No home health ordered. Dr Thelma Bargeaks will be following him for chest tube management.               Action/Plan:   Expected Discharge Date:                  Expected Discharge Plan:  Home w Home Health Services  In-House Referral:     Discharge planning Services     Post Acute Care Choice:    Choice offered to:     DME Arranged:    DME Agency:     HH Arranged:    HH Agency:     Status of Service:  In process, will continue to follow  Medicare Important Message Given:    Date Medicare IM Given:    Medicare IM give by:    Date Additional Medicare IM Given:    Additional Medicare Important Message give by:     If discussed at Long Length of Stay Meetings, dates discussed:    Additional Comments:  [name], RN 10/09/2015, 2:56 PM

## 2015-10-10 NOTE — Discharge Summary (Signed)
[employer] - [place] at [facility]   PATIENT NAME: Sean [name]    MR#:  [id number]  DATE OF BIRTH:  [birth year]  DATE OF ADMISSION:  09/21/2015 ADMITTING PHYSICIAN: Shaune Pollack, MD  DATE OF DISCHARGE: 10/09/2015  4:39 PM  PRIMARY CARE PHYSICIAN: AYCOCK, NGWE A, MD    ADMISSION DIAGNOSIS:  Hyponatremia [E87.1] Hyperglycemia [R73.9] Pneumonia involving left lung, unspecified part of lung [J18.9]  DISCHARGE DIAGNOSIS:  Principal Problem:   Intellectual disability Active Problems:   Sepsis (HCC)   Pneumonia   Hyponatremia   Empyema (HCC)   SECONDARY DIAGNOSIS:   Past Medical History  Diagnosis Date  . Diabetes mellitus without complication (HCC)   . Hypertension   . Mentally disabled     HOSPITAL COURSE:   50 year old male with past medical history of hypertension, diabetes, mental retardation who presented to the hospital with pneumonia and empyema.  1. Sepsis-this was secondary to left lower lobe pneumonia with loculated effusion/empyema. Initially patient was admitted to the hospital and started on broad-spectrum IV antibiotics and now has been weaned off of it. She was on Levaquin and Flagyl. Patient was seen by infectious disease and they agreed with this management. -Given the loculated pleural effusion and empyema a cardiothoracic surgical consult was also obtained. Patient was seen by Dr. Thelma Barge and underwent decortication and chest tube placement.  -Patient post antibiotics and chest tube placement has improved. He is now afebrile, hemodynamically stable. His lactic acid is normal. -He is being discharged home and the chest tubes are still in place and are to be managed by Dr. Thelma Barge as an outpatient.  2. Pneumonia with empyema-patient is status post left-sided thoracotomy and decortication. -Initially patient was on Levaquin and Flagyl and then vancomycin was added as patient's white cell count was increasing. His cultures were negative while in the  hospital and then he was taken off the antibiotics. -Post thoracotomy and decortication patient's clinical symptoms have improved. He still has some pain near the chest tube site. -Patient's chest tubes have been converted to empyema tubes which will continue to be further were drawn as an outpatient by Dr. Thelma Barge.  3. Lactic acidosis-this was due to sepsis and pneumonia. Now resolved.  4. 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  5. Hypokalemia- this has been repleted and is currently stable.  6. Diabetes type 2 without complication-in the hospital patient was maintained on sliding scale insulin and Lantus. -His blood sugars have remained stable. He will resume his metformin, Lantus upon discharge.  7. Constipation-this has been treated and resolved. It was secondary to  narcotics.  DISCHARGE CONDITIONS:   Stable  CONSULTS OBTAINED:  Treatment Team:  Hulda Marin, MD Mick Sell, MD Audery Amel, MD  DRUG ALLERGIES:   Allergies  Allergen Reactions  . Penicillins Other (See Comments)    Reaction:  Unknown     DISCHARGE MEDICATIONS:   Discharge Medication List as of 10/09/2015  3:20 PM    START taking these medications   Details  ondansetron (ZOFRAN) 4 MG tablet Take 1 tablet (4 mg total) by mouth every 8 (eight) hours as needed for nausea or vomiting., Starting 09/29/2015, Until Discontinued, Normal    oxyCODONE-acetaminophen (PERCOCET/ROXICET) 5-325 MG tablet Take 1 tablet by mouth every 6 (six) hours as needed for moderate pain., Starting 09/29/2015, Until Discontinued, Print      CONTINUE these medications which have NOT CHANGED   Details  atorvastatin (LIPITOR)  20 MG tablet Take 20 mg by mouth every evening., Until Discontinued, Historical Med    insulin glargine (LANTUS) 100 UNIT/ML injection Inject 36 Units into the skin at bedtime., Until Discontinued, Historical Med    lisinopril  (PRINIVIL,ZESTRIL) 10 MG tablet Take 10 mg by mouth daily., Until Discontinued, Historical Med    metFORMIN (GLUCOPHAGE) 1000 MG tablet Take 1,000 mg by mouth 2 (two) times daily with a meal., Until Discontinued, Historical Med         DISCHARGE INSTRUCTIONS:   DIET:  Cardiac diet and Diabetic diet  DISCHARGE CONDITION:  Stable  ACTIVITY:  Activity as tolerated  OXYGEN:  Home Oxygen: No.   Oxygen Delivery: room air  DISCHARGE LOCATION:  home   If you experience worsening of your admission symptoms, develop shortness of breath, life threatening emergency, suicidal or homicidal thoughts you must seek medical attention immediately by calling 911 or calling your MD immediately  if symptoms less severe.  You Must read complete instructions/literature along with all the possible adverse reactions/side effects for all the Medicines you take and that have been prescribed to you. Take any new Medicines after you have completely understood and accpet all the possible adverse reactions/side effects.   Please note  You were cared for by a hospitalist during your hospital stay. If you have any questions about your discharge medications or the care you received while you were in the hospital after you are discharged, you can call the unit and asked to speak with the hospitalist on call if the hospitalist that took care of you is not available. Once you are discharged, your primary care physician will handle any further medical issues. Please note that NO REFILLS for any discharge medications will be authorized once you are discharged, as it is imperative that you return to your primary care physician (or establish a relationship with a primary care physician if you do not have one) for your aftercare needs so that they can reassess your need for medications and monitor your lab values.     DATA REVIEW:   CBC  Recent Labs Lab 10/09/15 0426  WBC 7.6  HGB 9.6*  HCT 28.7*  PLT 662*     Chemistries   Recent Labs Lab 10/05/15 1334  10/08/15 0620 10/09/15 0426  NA 131*  < > 136  --   K 4.5  --   --   --   CL 97*  --   --   --   CO2 29  --   --   --   GLUCOSE 188*  --   --   --   BUN 11  --   --   --   CREATININE 0.78  < >  --  0.79  CALCIUM 8.3*  --   --   --   < > = values in this interval not displayed.  Cardiac Enzymes No results for input(s): TROPONINI in the last 168 hours.  Microbiology Results  Results for orders placed or performed during the hospital encounter of 09/21/15  Urine culture     Status: None   Collection Time: 09/21/15  1:19 PM  Result Value Ref Range Status   Specimen Description URINE, RANDOM  Final   Special Requests NONE  Final   Culture NO GROWTH 1 DAY  Final   Report Status 09/22/2015 FINAL  Final  Blood Culture (routine x 2)     Status: None   Collection Time: 09/21/15  3:34 PM  Result Value Ref Range Status   Specimen Description BLOOD LEFT ASSIST CONTROL  Final   Special Requests BOTTLES DRAWN AEROBIC AND ANAEROBIC  1CC  Final   Culture NO GROWTH 5 DAYS  Final   Report Status 09/26/2015 FINAL  Final  Rapid Influenza A&B Antigens ([facility] only)     Status: None   Collection Time: 09/21/15  3:34 PM  Result Value Ref Range Status   Influenza A ([facility]) NEGATIVE NEGATIVE Final   Influenza B ([facility]) NEGATIVE NEGATIVE Final  Blood Culture (routine x 2)     Status: None   Collection Time: 09/21/15  3:40 PM  Result Value Ref Range Status   Specimen Description BLOOD RIGHT ASSIST CONTROL  Final   Special Requests BOTTLES DRAWN AEROBIC AND ANAEROBIC  1CC  Final   Culture NO GROWTH 5 DAYS  Final   Report Status 09/26/2015 FINAL  Final  MRSA PCR Screening     Status: None   Collection Time: 09/22/15  3:42 PM  Result Value Ref Range Status   MRSA by PCR NEGATIVE NEGATIVE Final    Comment:        The GeneXpert MRSA Assay (FDA approved for NASAL specimens only), is one component of a comprehensive MRSA colonization surveillance  program. It is not intended to diagnose MRSA infection nor to guide or monitor treatment for MRSA infections.   Culture, body fluid-bottle     Status: None   Collection Time: 09/23/15  3:15 PM  Result Value Ref Range Status   Specimen Description FLUID  Final   Special Requests NONE  Final   Gram Stain   Final    MANY WBC SEEN MANY GRAM POSITIVE COCCI IN CHAINS    Culture NO GROWTH 5 DAYS UNABLE TO PROPIGATE  Final   Report Status 09/30/2015 FINAL  Final  Tissue culture     Status: None   Collection Time: 09/28/15  3:29 PM  Result Value Ref Range Status   Specimen Description TISSUE  Final   Special Requests NONE  Final   Culture No growth aerobically or anaerobically.  Final   Report Status 10/03/2015 FINAL  Final  MRSA PCR Screening     Status: None   Collection Time: 09/28/15  8:47 PM  Result Value Ref Range Status   MRSA by PCR NEGATIVE NEGATIVE Final    Comment:        The GeneXpert MRSA Assay (FDA approved for NASAL specimens only), is one component of a comprehensive MRSA colonization surveillance program. It is not intended to diagnose MRSA infection nor to guide or monitor treatment for MRSA infections.     RADIOLOGY:  No results found.    Management plans discussed with the patient, family and they are in agreement.  CODE STATUS:  Code Status History    Date Active Date Inactive Code Status Order ID Comments User Context   09/24/2015  3:14 PM 10/09/2015  7:40 PM Full Code [id number]  Sylvan Cheese, RN Inpatient      TOTAL TIME TAKING CARE OF THIS PATIENT: 40 minutes.    Houston Siren M.D on 10/10/2015 at 2:28 PM  Between 7am to 6pm - Pager - [phone]  After 6pm go to [email or web address] - password [email or web address] [facility]  [place] [facility]  Office  [phone]  CC: Primary care physician; Emogene Morgan, MD

## 2015-10-11 ENCOUNTER — Telehealth: Payer: Self-pay | Admitting: Cardiothoracic Surgery

## 2015-10-11 NOTE — Telephone Encounter (Signed)
Returned phone call to family member at this time. No answer. Left voicemail for return phone call.

## 2015-10-11 NOTE — Telephone Encounter (Signed)
Patient has a follow [facility] Patient has been advised.

## 2015-10-11 NOTE — Telephone Encounter (Signed)
   °   °  °  ° °  Patient had a thoracoscopy with a left thoracotomy with decortication on 09/28/15 with Dr Thelma Bargeaks. Patient's brother has called and has concerns with the patient's chest tubes. Please call patient's brother.

## 2015-10-12 ENCOUNTER — Other Ambulatory Visit: Payer: Self-pay

## 2015-10-14 ENCOUNTER — Ambulatory Visit
Admission: RE | Admit: 2015-10-14 | Discharge: 2015-10-14 | Disposition: A | Discharge status: Home / Self Care | Payer: Medicaid Other | Source: Intra-hospital | Attending: Cardiothoracic Surgery | Admitting: Cardiothoracic Surgery

## 2015-10-14 ENCOUNTER — Encounter: Payer: Self-pay | Admitting: Cardiothoracic Surgery

## 2015-10-14 ENCOUNTER — Ambulatory Visit (INDEPENDENT_AMBULATORY_CARE_PROVIDER_SITE_OTHER): Payer: Medicaid Other | Admitting: Cardiothoracic Surgery

## 2015-10-14 ENCOUNTER — Other Ambulatory Visit: Payer: Self-pay | Admitting: Cardiothoracic Surgery

## 2015-10-14 ENCOUNTER — Ambulatory Visit
Admission: RE | Admit: 2015-10-14 | Discharge: 2015-10-14 | Disposition: A | Discharge status: Home / Self Care | Payer: Medicaid Other | Source: Ambulatory Visit | Attending: Cardiothoracic Surgery | Admitting: Cardiothoracic Surgery

## 2015-10-14 VITALS — BP 141/86 | HR 90 | Temp 97.9°F | Ht 65.0 in | Wt 126.0 lb

## 2015-10-14 DIAGNOSIS — Z09 Encounter for follow-up examination after completed treatment for conditions other than malignant neoplasm: Secondary | ICD-10-CM | POA: Diagnosis not present

## 2015-10-14 DIAGNOSIS — Z9889 Other specified postprocedural states: Secondary | ICD-10-CM | POA: Diagnosis not present

## 2015-10-14 NOTE — Progress Notes (Signed)
Subjective:     Patient ID: Sean [name], male   DOB: 29-Jul-1965, 50 y.o.   MRN: [id number]  HPI   Review of Systems     Objective:   Physical Exam     Assessment:     g     Plan:     [facility] Inpatient Post-Op Note  Patient ID: Sean [name], male   DOB: 29-Jul-1965, 50 y.o.   MRN: [id number]  HISTORY: He returns in follow-up. The patient is accompanied today by his 2 brothers who state that he's been doing well at home with a good appetite and no fever.  Filed Vitals:   10/14/15 0912  BP: 141/86  Pulse: 90  Temp: 97.9 F (36.6 C)     EXAM: All of his wounds are well-healed. His surgical staples are intact. We advance his chest tube several centimeters. He had a chest x-ray made prior to this and it looked quite good overall.    ASSESSMENT: I resecured the chest tubes today with #1 Prolene using sterile technique. I will see the patient back again in one week to advance his tubes. We'll have a chest x-ray made prior to that visit.   PLAN:   We will see the patient back in one week    Hulda Marinimothy Carina Chaplin, MD

## 2015-10-14 NOTE — Patient Instructions (Signed)
You will need to follow-up next Thursday morning at 0800am with a chest x-ray before.  Please do not shower, but you may take sponge baths while chest tubes are in.  Keep a dry dressing over the chest tubes but change a minimum of once daily and then if soiled change immediately.

## 2015-10-20 ENCOUNTER — Inpatient Hospital Stay
Admission: EM | Admit: 2015-10-20 | Discharge: 2015-10-25 | DRG: 872 | Disposition: A | Discharge status: Home Health | Payer: Medicaid Other | Attending: Internal Medicine | Admitting: Internal Medicine

## 2015-10-20 DIAGNOSIS — B9562 Methicillin resistant Staphylococcus aureus infection as the cause of diseases classified elsewhere: Secondary | ICD-10-CM | POA: Diagnosis present

## 2015-10-20 DIAGNOSIS — A4102 Sepsis due to Methicillin resistant Staphylococcus aureus: Principal | ICD-10-CM | POA: Diagnosis present

## 2015-10-20 DIAGNOSIS — Z09 Encounter for follow-up examination after completed treatment for conditions other than malignant neoplasm: Secondary | ICD-10-CM

## 2015-10-20 DIAGNOSIS — E785 Hyperlipidemia, unspecified: Secondary | ICD-10-CM | POA: Diagnosis present

## 2015-10-20 DIAGNOSIS — Z880 Allergy status to penicillin: Secondary | ICD-10-CM

## 2015-10-20 DIAGNOSIS — I10 Essential (primary) hypertension: Secondary | ICD-10-CM | POA: Diagnosis present

## 2015-10-20 DIAGNOSIS — Z978 Presence of other specified devices: Secondary | ICD-10-CM

## 2015-10-20 DIAGNOSIS — F840 Autistic disorder: Secondary | ICD-10-CM | POA: Diagnosis present

## 2015-10-20 DIAGNOSIS — R066 Hiccough: Secondary | ICD-10-CM | POA: Diagnosis present

## 2015-10-20 DIAGNOSIS — L03313 Cellulitis of chest wall: Secondary | ICD-10-CM | POA: Diagnosis present

## 2015-10-20 DIAGNOSIS — Z794 Long term (current) use of insulin: Secondary | ICD-10-CM

## 2015-10-20 DIAGNOSIS — J869 Pyothorax without fistula: Secondary | ICD-10-CM | POA: Diagnosis present

## 2015-10-20 DIAGNOSIS — F79 Unspecified intellectual disabilities: Secondary | ICD-10-CM | POA: Diagnosis present

## 2015-10-20 DIAGNOSIS — A419 Sepsis, unspecified organism: Secondary | ICD-10-CM

## 2015-10-20 DIAGNOSIS — E119 Type 2 diabetes mellitus without complications: Secondary | ICD-10-CM | POA: Diagnosis present

## 2015-10-20 HISTORY — DX: Hyperlipidemia, unspecified: E78.5

## 2015-10-21 ENCOUNTER — Ambulatory Visit: Payer: Self-pay | Admitting: Cardiothoracic Surgery

## 2015-10-21 ENCOUNTER — Emergency Department: Payer: Medicaid Other

## 2015-10-21 DIAGNOSIS — I10 Essential (primary) hypertension: Secondary | ICD-10-CM | POA: Diagnosis present

## 2015-10-21 DIAGNOSIS — E785 Hyperlipidemia, unspecified: Secondary | ICD-10-CM | POA: Diagnosis present

## 2015-10-21 DIAGNOSIS — R066 Hiccough: Secondary | ICD-10-CM | POA: Diagnosis present

## 2015-10-21 DIAGNOSIS — F840 Autistic disorder: Secondary | ICD-10-CM | POA: Diagnosis present

## 2015-10-21 DIAGNOSIS — R509 Fever, unspecified: Secondary | ICD-10-CM | POA: Diagnosis present

## 2015-10-21 DIAGNOSIS — Z794 Long term (current) use of insulin: Secondary | ICD-10-CM | POA: Diagnosis not present

## 2015-10-21 DIAGNOSIS — B9562 Methicillin resistant Staphylococcus aureus infection as the cause of diseases classified elsewhere: Secondary | ICD-10-CM | POA: Diagnosis present

## 2015-10-21 DIAGNOSIS — E119 Type 2 diabetes mellitus without complications: Secondary | ICD-10-CM | POA: Insufficient documentation

## 2015-10-21 DIAGNOSIS — Z880 Allergy status to penicillin: Secondary | ICD-10-CM | POA: Diagnosis not present

## 2015-10-21 DIAGNOSIS — F79 Unspecified intellectual disabilities: Secondary | ICD-10-CM | POA: Diagnosis present

## 2015-10-21 DIAGNOSIS — L03313 Cellulitis of chest wall: Secondary | ICD-10-CM | POA: Diagnosis present

## 2015-10-21 DIAGNOSIS — Z978 Presence of other specified devices: Secondary | ICD-10-CM | POA: Diagnosis not present

## 2015-10-21 DIAGNOSIS — A4102 Sepsis due to Methicillin resistant Staphylococcus aureus: Secondary | ICD-10-CM | POA: Diagnosis present

## 2015-10-21 LAB — CBC WITH DIFFERENTIAL/PLATELET
Basophils Absolute: 0.1 10*3/uL (ref 0–0.1)
Eosinophils Absolute: 0 10*3/uL (ref 0–0.7)
Eosinophils Relative: 0 %
HEMATOCRIT: 28.8 % — AB (ref 40.0–52.0)
HEMOGLOBIN: 9.4 g/dL — AB (ref 13.0–18.0)
LYMPHS ABS: 1.3 10*3/uL (ref 1.0–3.6)
Lymphocytes Relative: 8 %
MCH: 27.1 pg (ref 26.0–34.0)
MCHC: 32.7 g/dL (ref 32.0–36.0)
MCV: 82.9 fL (ref 80.0–100.0)
Monocytes Absolute: 0.8 10*3/uL (ref 0.2–1.0)
NEUTROS ABS: 14.5 10*3/uL — AB (ref 1.4–6.5)
PLATELETS: 549 10*3/uL — AB (ref 150–440)
RBC: 3.47 MIL/uL — AB (ref 4.40–5.90)
RDW: 15 % — ABNORMAL HIGH (ref 11.5–14.5)
WBC: 16.7 10*3/uL — AB (ref 3.8–10.6)

## 2015-10-21 LAB — COMPREHENSIVE METABOLIC PANEL
ALT: 19 U/L (ref 17–63)
ANION GAP: 9 (ref 5–15)
AST: 18 U/L (ref 15–41)
Albumin: 2.8 g/dL — ABNORMAL LOW (ref 3.5–5.0)
Alkaline Phosphatase: 80 U/L (ref 38–126)
BUN: 25 mg/dL — ABNORMAL HIGH (ref 6–20)
CHLORIDE: 93 mmol/L — AB (ref 101–111)
CO2: 26 mmol/L (ref 22–32)
Calcium: 9 mg/dL (ref 8.9–10.3)
Creatinine, Ser: 0.75 mg/dL (ref 0.61–1.24)
Glucose, Bld: 254 mg/dL — ABNORMAL HIGH (ref 65–99)
Potassium: 5.1 mmol/L (ref 3.5–5.1)
SODIUM: 128 mmol/L — AB (ref 135–145)
Total Bilirubin: 0.1 mg/dL — ABNORMAL LOW (ref 0.3–1.2)
Total Protein: 8.1 g/dL (ref 6.5–8.1)

## 2015-10-21 LAB — GLUCOSE, CAPILLARY
GLUCOSE-CAPILLARY: 216 mg/dL — AB (ref 65–99)
GLUCOSE-CAPILLARY: 150 mg/dL — AB (ref 65–99)
GLUCOSE-CAPILLARY: 406 mg/dL — AB (ref 65–99)
GLUCOSE-CAPILLARY: 411 mg/dL — AB (ref 65–99)
Glucose-Capillary: 235 mg/dL — ABNORMAL HIGH (ref 65–99)
Glucose-Capillary: 186 mg/dL — ABNORMAL HIGH (ref 65–99)

## 2015-10-21 LAB — URINALYSIS COMPLETE WITH MICROSCOPIC (ARMC ONLY)
BILIRUBIN URINE: NEGATIVE
Bacteria, UA: NONE SEEN
HGB URINE DIPSTICK: NEGATIVE
KETONES UR: NEGATIVE mg/dL
LEUKOCYTES UA: NEGATIVE
NITRITE: NEGATIVE
Protein, ur: NEGATIVE mg/dL
RBC / HPF: NONE SEEN RBC/hpf (ref 0–5)
SPECIFIC GRAVITY, URINE: 1.003 — AB (ref 1.005–1.030)
Squamous Epithelial / LPF: NONE SEEN
pH: 6 (ref 5.0–8.0)

## 2015-10-21 LAB — HEMOGLOBIN A1C: HEMOGLOBIN A1C: 9.3 % — AB (ref 4.0–6.0)

## 2015-10-21 LAB — LACTIC ACID, PLASMA
LACTIC ACID, VENOUS: 2.2 mmol/L — AB (ref 0.5–2.0)
Lactic Acid, Venous: 1.8 mmol/L (ref 0.5–2.0)

## 2015-10-21 MED ORDER — ACETAMINOPHEN 650 MG RE SUPP: 650.0000 mg | Freq: Four times a day (QID) | RECTAL | Status: DC | PRN

## 2015-10-21 MED ORDER — INSULIN ASPART 100 UNIT/ML ~~LOC~~ SOLN
0.0000 [IU] | Freq: Three times a day (TID) | SUBCUTANEOUS | Status: DC
  Administered 2015-10-21: 2 [IU] via SUBCUTANEOUS
  Administered 2015-10-21: 9 [IU] via SUBCUTANEOUS
  Administered 2015-10-21: 3 [IU] via SUBCUTANEOUS
  Administered 2015-10-21 – 2015-10-22 (×2): 1 [IU] via SUBCUTANEOUS
  Administered 2015-10-22: 9 [IU] via SUBCUTANEOUS
  Administered 2015-10-22 – 2015-10-23 (×4): 5 [IU] via SUBCUTANEOUS
  Administered 2015-10-23: 7 [IU] via SUBCUTANEOUS
  Administered 2015-10-24: 1 [IU] via SUBCUTANEOUS
  Administered 2015-10-24: 5 [IU] via SUBCUTANEOUS
  Administered 2015-10-24 (×2): 2 [IU] via SUBCUTANEOUS
  Administered 2015-10-25: 7 [IU] via SUBCUTANEOUS
  Administered 2015-10-25: 2 [IU] via SUBCUTANEOUS
  Filled 2015-10-21: qty 5
  Filled 2015-10-21 (×2): qty 1
  Filled 2015-10-21: qty 5
  Filled 2015-10-21: qty 1
  Filled 2015-10-21 (×2): qty 2
  Filled 2015-10-21: qty 5
  Filled 2015-10-21: qty 7
  Filled 2015-10-21: qty 9
  Filled 2015-10-21: qty 7
  Filled 2015-10-21: qty 9
  Filled 2015-10-21: qty 3
  Filled 2015-10-21 (×2): qty 5
  Filled 2015-10-21: qty 2
  Filled 2015-10-21: qty 7

## 2015-10-21 MED ORDER — ONDANSETRON HCL 4 MG PO TABS: 4.0000 mg | ORAL_TABLET | Freq: Four times a day (QID) | ORAL | Status: DC | PRN

## 2015-10-21 MED ORDER — LISINOPRIL 10 MG PO TABS
10.0000 mg | ORAL_TABLET | Freq: Every day | ORAL | Status: DC
  Administered 2015-10-21 – 2015-10-22 (×2): 10 mg via ORAL
  Filled 2015-10-21 (×2): qty 1

## 2015-10-21 MED ORDER — SODIUM CHLORIDE 0.9 % IV BOLUS (SEPSIS)
1000.0000 mL | Freq: Once | INTRAVENOUS | Status: AC
  Administered 2015-10-21: 1000 mL via INTRAVENOUS

## 2015-10-21 MED ORDER — VANCOMYCIN HCL IN DEXTROSE 1-5 GM/200ML-% IV SOLN
1000.0000 mg | Freq: Once | INTRAVENOUS | Status: AC
  Administered 2015-10-21: 1000 mg via INTRAVENOUS
  Filled 2015-10-21: qty 200

## 2015-10-21 MED ORDER — SODIUM CHLORIDE 0.9 % IV SOLN
INTRAVENOUS | Status: AC
  Administered 2015-10-21: 04:00:00 via INTRAVENOUS

## 2015-10-21 MED ORDER — MORPHINE SULFATE (PF) 2 MG/ML IV SOLN
2.0000 mg | INTRAVENOUS | Status: DC | PRN
  Administered 2015-10-21 – 2015-10-22 (×2): 2 mg via INTRAVENOUS
  Filled 2015-10-21 (×2): qty 1

## 2015-10-21 MED ORDER — ACETAMINOPHEN 325 MG PO TABS
650.0000 mg | ORAL_TABLET | Freq: Four times a day (QID) | ORAL | Status: DC | PRN
  Filled 2015-10-21: qty 2

## 2015-10-21 MED ORDER — ENOXAPARIN SODIUM 40 MG/0.4ML ~~LOC~~ SOLN
40.0000 mg | SUBCUTANEOUS | Status: DC
  Administered 2015-10-21 – 2015-10-24 (×4): 40 mg via SUBCUTANEOUS
  Filled 2015-10-21 (×4): qty 0.4

## 2015-10-21 MED ORDER — SODIUM CHLORIDE 0.9 % IV SOLN
1.0000 g | Freq: Three times a day (TID) | INTRAVENOUS | Status: DC
  Administered 2015-10-21 – 2015-10-22 (×5): 1 g via INTRAVENOUS
  Filled 2015-10-21 (×6): qty 1

## 2015-10-21 MED ORDER — SODIUM CHLORIDE 0.9% FLUSH
3.0000 mL | Freq: Two times a day (BID) | INTRAVENOUS | Status: DC
  Administered 2015-10-21 – 2015-10-24 (×5): 3 mL via INTRAVENOUS

## 2015-10-21 MED ORDER — ONDANSETRON HCL 4 MG/2ML IJ SOLN: 4.0000 mg | Freq: Four times a day (QID) | INTRAMUSCULAR | Status: DC | PRN

## 2015-10-21 MED ORDER — VANCOMYCIN HCL IN DEXTROSE 1-5 GM/200ML-% IV SOLN
1000.0000 mg | Freq: Two times a day (BID) | INTRAVENOUS | Status: DC
  Administered 2015-10-21 – 2015-10-22 (×3): 1000 mg via INTRAVENOUS
  Filled 2015-10-21 (×5): qty 200

## 2015-10-21 MED ORDER — CHLORPROMAZINE HCL 10 MG PO TABS
10.0000 mg | ORAL_TABLET | Freq: Once | ORAL | Status: AC
  Administered 2015-10-22: 10 mg via ORAL
  Filled 2015-10-21: qty 1

## 2015-10-21 MED ORDER — METOPROLOL TARTRATE 25 MG PO TABS
12.5000 mg | ORAL_TABLET | Freq: Two times a day (BID) | ORAL | Status: DC
  Administered 2015-10-21 – 2015-10-22 (×3): 12.5 mg via ORAL
  Filled 2015-10-21 (×3): qty 1

## 2015-10-21 MED ORDER — ATORVASTATIN CALCIUM 20 MG PO TABS
20.0000 mg | ORAL_TABLET | Freq: Every evening | ORAL | Status: DC
  Administered 2015-10-21 – 2015-10-24 (×4): 20 mg via ORAL
  Filled 2015-10-21 (×4): qty 1

## 2015-10-21 MED ORDER — OXYCODONE HCL 5 MG PO TABS
5.0000 mg | ORAL_TABLET | ORAL | Status: DC | PRN
  Administered 2015-10-24 (×2): 5 mg via ORAL
  Filled 2015-10-21 (×3): qty 1

## 2015-10-21 MED ORDER — SODIUM CHLORIDE 0.9 % IV BOLUS (SEPSIS): 250.0000 mL | Freq: Once | INTRAVENOUS | Status: DC

## 2015-10-21 NOTE — Progress Notes (Signed)
Pt chest tubes pulled excess tubing removed remaining tubing anchored by MD. The number 3 tube ( the one farthest from midline is draining copious amounts of pus like drainage. Site cleansed with Hibiclens and abd applied per request of Dr Thelma Bargeaks.

## 2015-10-21 NOTE — ED Provider Notes (Signed)
[facility] Emergency Department Provider Note  ____________________________________________  Time seen: 12:10 AM I have reviewed the triage vital signs and the nursing notes.   HISTORY  Chief Complaint Fever     HPI Sean [name] is a 50 y.o. male with history of recent pneumonia chest tube placement presents via history of fever at home. EMS states that patient is tachycardic in route with a heart rate of  115 EMS. Patient was admitted to [facility] for 10/07/2015 for pneumonia with chest tube placements. Chest tubes are still in place however not to suction or drainage. Patient febrile on presentation with temperature 100.9 heart rate of 114 tachypnea with a respiratory rate of 24     Past Medical History  Diagnosis Date  . Diabetes mellitus without complication (HCC)   . Hypertension   . Mentally disabled   . HLD (hyperlipidemia)     Patient Active Problem List   Diagnosis Date Noted  . HTN (hypertension) 10/21/2015  . HLD (hyperlipidemia) 10/21/2015  . Type 2 diabetes mellitus (HCC) 10/21/2015  . Intellectual disability 10/07/2015  . Empyema (HCC)   . Sepsis (HCC) 09/22/2015  . Pneumonia   . Hyponatremia     Past Surgical History  Procedure Laterality Date  . Video bronchoscopy N/A 09/28/2015    Procedure: VIDEO BRONCHOSCOPY;  Surgeon: Hulda Marin, MD;  Location: [facility] ORS;  Service: Thoracic;  Laterality: N/A;  . Thoracotomy Left 09/28/2015    Procedure: THORACOTOMY MAJOR WITH DECORTICATION;  Surgeon: Hulda Marin, MD;  Location: [facility] ORS;  Service: Thoracic;  Laterality: Left;    Current Outpatient Rx  Name  Route  Sig  Dispense  Refill  . atorvastatin (LIPITOR) 20 MG tablet   Oral   Take 20 mg by mouth every evening.         . insulin glargine (LANTUS) 100 UNIT/ML injection   Subcutaneous   Inject 36 Units into the skin at bedtime.         Marland Kitchen lisinopril (PRINIVIL,ZESTRIL) 10 MG tablet   Oral   Take 10 mg by mouth  daily.         . metFORMIN (GLUCOPHAGE) 1000 MG tablet   Oral   Take 1,000 mg by mouth 2 (two) times daily with a meal.         . ondansetron (ZOFRAN) 4 MG tablet   Oral   Take 1 tablet (4 mg total) by mouth every 8 (eight) hours as needed for nausea or vomiting.   20 tablet   0   . oxyCODONE-acetaminophen (PERCOCET/ROXICET) 5-325 MG tablet   Oral   Take 1 tablet by mouth every 6 (six) hours as needed for moderate pain.   30 tablet   0     Allergies Penicillins  Family History  Problem Relation Age of Onset  . Hypertension Mother   . Diabetes Mother   . Hypertension Father   . Diabetes Father     Social History Social History  Substance Use Topics  . Smoking status: Never Smoker   . Smokeless tobacco: Never Used     Comment: no passive smoke in home  . Alcohol Use: No    Review of Systems  Constitutional: Positive for fever. Eyes: Negative for visual changes. ENT: Negative for sore throat. Cardiovascular: Negative for chest pain. Respiratory: Positive for shortness of breath. Gastrointestinal: Negative for abdominal pain, vomiting and diarrhea. Genitourinary: Negative for dysuria. Musculoskeletal: Negative for back pain. Skin: Negative for rash. Neurological: Negative for headaches, focal  weakness or numbness.   10-point ROS otherwise negative.  ____________________________________________   PHYSICAL EXAM:  VITAL SIGNS: ED Triage Vitals  Enc Vitals Group     BP 10/21/15 0008 135/79 mmHg     Pulse Rate 10/21/15 0008 114     Resp 10/21/15 0008 20     Temp 10/21/15 0008 100.9 F (38.3 C)     Temp Source 10/21/15 0008 Rectal     SpO2 10/21/15 0008 97 %     Weight 10/21/15 0008 130 lb (58.968 kg)     Height 10/21/15 0008 5\' 5"  (1.651 m)     Head Cir --      Peak Flow --      Pain Score --      Pain Loc --      Pain Edu? --      Excl. in GC? --      Constitutional: Alert and oriented. Well appearing and in no distress. Eyes:  Conjunctivae are normal. PERRL. Normal extraocular movements. ENT   Head: Normocephalic and atraumatic.   Nose: No congestion/rhinnorhea.   Mouth/Throat: Mucous membranes are moist.   Neck: No stridor. Hematological/Lymphatic/Immunilogical: No cervical lymphadenopathy. Cardiovascular: Tachycardia Normal and symmetric distal pulses are present in all extremities. No murmurs, rubs, or gallops. Respiratory: Normal respiratory effort without tachypnea nor retractions. Breath sounds are clear and equal bilaterally. No wheezes/rales/rhonchi. Gastrointestinal: Soft and nontender. No distention. There is no CVA tenderness. Genitourinary: deferred Musculoskeletal: Nontender with normal range of motion in all extremities. No joint effusions.  No lower extremity tenderness nor edema. Neurologic:  Normal speech and language. No gross focal neurologic deficits are appreciated. Speech is normal.  Skin:  Skin is warm, dry and intact. No rash noted. Psychiatric: Mood and affect are normal. Speech and behavior are normal. Patient exhibits appropriate insight and judgment.  ____________________________________________    LABS (pertinent positives/negatives)  Labs Reviewed  COMPREHENSIVE METABOLIC PANEL - Abnormal; Notable for the following:    Sodium 128 (*)    Chloride 93 (*)    Glucose, Bld 254 (*)    BUN 25 (*)    Albumin 2.8 (*)    Total Bilirubin 0.1 (*)    All other components within normal limits  CBC WITH DIFFERENTIAL/PLATELET - Abnormal; Notable for the following:    WBC 16.7 (*)    RBC 3.47 (*)    Hemoglobin 9.4 (*)    HCT 28.8 (*)    RDW 15.0 (*)    Platelets 549 (*)    Neutro Abs 14.5 (*)    All other components within normal limits  LACTIC ACID, PLASMA - Abnormal; Notable for the following:    Lactic Acid, Venous 2.2 (*)    All other components within normal limits  CULTURE, BLOOD (ROUTINE X 2)  CULTURE, BLOOD (ROUTINE X 2)  URINE CULTURE  LACTIC ACID, PLASMA   URINALYSIS COMPLETEWITH MICROSCOPIC ([facility] ONLY)     ____________________________________________   EKG  ED ECG REPORT I, Hastings N Fransico Sciandra, the attending physician, personally viewed and interpreted this ECG.   Date: 10/21/2015  EKG Time: 12:07 AM  Rate: 109  Rhythm: Sinus tachycardia  Axis: Normal  Intervals: Normal  ST&T Change: None   ____________________________________________    RADIOLOGY  DG Chest 2 View (Final result) Result time: 10/21/15 00:37:19   Final result by Rad Results In Interface (10/21/15 00:37:19)   Narrative:   CLINICAL DATA: Febrile. Chest pain.  EXAM: CHEST 2 VIEW  COMPARISON: 10/14/2015  FINDINGS: Three left chest  tubes appear unchanged in position. No significant pneumothorax. There may be a very small pleural air collection, manifested by relative hyperlucency adjacent to the aortic knob. No large effusion. Mild atelectatic appearing linear opacities in the left base. The right lung is clear.  IMPRESSION: Three chest tubes appear unremarkable, with no significant pneumothorax. No confluent consolidation or large effusion.   Electronically Signed By: Ellery Plunk M.D. On: 10/21/2015 00:37         Critical Care performed: CRITICAL CARE Performed by: Darci Current   Total critical care time: 40 minutes  Critical care time was exclusive of separately billable procedures and treating other patients.  Critical care was necessary to treat or prevent imminent or life-threatening deterioration.  Critical care was time spent personally by me on the following activities: development of treatment plan with patient and/or surrogate as well as nursing, discussions with consultants, evaluation of patient's response to treatment, examination of patient, obtaining history from patient or surrogate, ordering and performing treatments and interventions, ordering and review of laboratory studies, ordering and review of  radiographic studies, pulse oximetry and re-evaluation of patient's condition.   ____________________________________________   INITIAL IMPRESSION / ASSESSMENT AND PLAN / ED COURSE  Pertinent labs & imaging results that were available during my care of the patient were reviewed by me and considered in my medical decision making (see chart for details).  Patient with tachycardia and tachypnea fever and leukocytosis and concern for sepsis as such patient received 30 ML's per kilogram of normal saline IV antibiotics. Patient discussed with Dr. Sheryle Hail for hospital admission for further evaluation and management ____________________________________________   FINAL CLINICAL IMPRESSION(S) / ED DIAGNOSES  Final diagnoses:  Sepsis, due to unspecified organism [facility])      Darci Current, MD 10/21/15 [phone]

## 2015-10-21 NOTE — Progress Notes (Signed)
Inpatient Diabetes Program Recommendations  AACE/ADA: New Consensus Statement on Inpatient Glycemic Control (2015)  Target Ranges:  Prepandial:   less than 140 mg/dL      Peak postprandial:   less than 180 mg/dL (1-2 hours)      Critically ill patients:  140 - 180 mg/dL   Review of Glycemic Control  Results for Sean [name], Sean [name] (MRN [id number]) as of 10/21/2015 12:03  Ref. Range 10/21/2015 04:31 10/21/2015 07:28 10/21/2015 11:30  Glucose-Capillary Latest Ref Range: 65-99 mg/dL 956216 (H) 387150 (H) 564235 (H)   Diabetes history: Type 2 Outpatient Diabetes medications: Lantus 36 units qhs, Metformin 1000mg  bid Current orders for Inpatient glycemic control:  Novolog 0-9 units tid/hs  Inpatient Diabetes Program Recommendations:   Please consider adding  Lantus insulin 10 units qhs (0.2units/kg), consider increasing Novolog correction to moderate correction scale 0-15 units tid and Novolog 0-5 units qhs  **I spoke with [name]- he tells me that blood sugars were very low at home with Lantus 36 units (55-80mg /dl)  Patient will very likely not need 36 units Lantus at discharge. **   Susette RacerJulie Jovonni Borquez, RN, BA, MHA, CDE Diabetes Coordinator Inpatient Diabetes Program  [phone] (Team Pager) [phone] [facility] Office) 10/21/2015 12:11 PM

## 2015-10-21 NOTE — ED Notes (Signed)
Arrived via ems - had pneumonia admission  09/21/2015 - today started running a fever - has an open chest tube (not to suction or drainage) - tachycardia

## 2015-10-21 NOTE — Clinical Social Work Note (Signed)
Clinical Social Work Assessment  Patient Details  Name: Pheng Prokop MRN: [id number] Date of Birth: 01/08/1966  Date of referral:  10/21/15               Reason for consult:  Care Management Concerns, Facility Placement                Permission sought to share information with:  Chartered certified accountant granted to share information::  No  Name::        Agency::     Relationship::     Contact Information:     Housing/Transportation Living arrangements for the past 2 months:  Single Family Home Source of Information:  Other (Comment Required) (Brother Air cabin crew ) Patient Interpreter Needed:  None Criminal Activity/Legal Involvement Pertinent to Current Situation/Hospitalization:  No - Comment as needed Significant Relationships:  Parents, Siblings Lives with:  Parents, Siblings Do you feel safe going back to the place where you live?    Need for family participation in patient care:  Yes (Comment)  Care giving concerns:  Patient lives with his mother, father and brother Theresia Lo in [place].    Social Worker assessment / plan:  Holiday representative (CSW) received a verbal consult from RN Case Freight forwarder that patient's brother Theresia Lo is requesting a SNF search in [place] only. CSW met with patient's brother Theresia Lo in the hallway outside of patient's room. Per brother patient lives with him their elderly mother who has dementia and their elderly father. CSW explained SNF options with Medicaid. Per brother he does not want patient to go to SNF and wants him to come home with home health. Per brother he would like a RN to change his dressings and an aide to bath him. Brother reported that he is not interested in SNF placement at this time. RN Case Manager aware of above. Please reconsult if future social work needs arise. CSW signing off.   Employment status:  Disabled (Comment on whether or not currently receiving Disability) Insurance information:  Medicaid In [place]  Recommendations:  Not assessed at this time Information / Referral to community resources:  Other (Comment Required) (Home Health services )  Patient/Family's Response to care:  Patient's brother is not interested in SNF at this time.   Patient/Family's Understanding of and Emotional Response to Diagnosis, Current Treatment, and Prognosis:  Patient's brother was pleasant and thanked CSW for visit.   Emotional Assessment Appearance:  Appears stated age Attitude/Demeanor/Rapport:  Unable to Assess Affect (typically observed):  Unable to Assess Orientation:  Oriented to Self, Fluctuating Orientation (Suspected and/or reported Sundowners) Alcohol / Substance use:  Not Applicable Psych involvement (Current and /or in the community):  No (Comment)  Discharge Needs  Concerns to be addressed:  Discharge Planning Concerns Readmission within the last 30 days:  Yes Current discharge risk:  Chronically ill Barriers to Discharge:  Continued Medical Work up   Loralyn Freshwater, LCSW 10/21/2015, 4:06 PM

## 2015-10-21 NOTE — ED Notes (Signed)
Pt has drank 2 cups of water and one diet ginger ale - at this time he is still unable to void - he and the family are aware that a sample is still needed

## 2015-10-21 NOTE — Care Management (Addendum)
Readmission with possible infection at chest tube site. Dr. Thelma Bargeaks at bedside this AM; suggested home health at time of discharge to Dr. Thelma Bargeaks. Patient does not speak [place] and no family was present. RNCM will continue to follow.  Komatsu,Saif Brother [phone]   Spoke with patient's brother. Mother has dementia, father is unable to care for him and Burna SisSaif (pronouced Pennie RushingSeth) works three jobs to support his family. He states that they have not been able to keep dressing clean/changed due to not being available and not having careful instruction at last hospitalization. He states patient has become weaker and "not able to even sit up now". He would like for patient to go to a SN[place] otherwise he would like home health services to include a nursing assistant to assist with ADLs. They would like to use [facility]. RNCM to follow patient for home health and potential IV antibiotic, and chest tubes (in place). CSW updated. Referral to [facility].

## 2015-10-21 NOTE — Progress Notes (Signed)
Pharmacy Antibiotic Note  Sean [name] is a 50 y.o. male admitted on 10/20/2015 with sepsis.  Pharmacy has been consulted for vancomycin and meropenem dosing.  Plan: DW 57.2kg  Vd 40L kei 0.082 hr-1  T1/2 8 hours. Vancomycin 1 gram q 12 hours ordered with stacked dosing. Level before 5th dose. Goal trough 15-20.  Meropenem 1 gram q 8 hours ordered.  Height: 5\' 5"  (165.1 cm) Weight: 130 lb (58.968 kg) IBW/kg (Calculated) : 61.5  Temp (24hrs), Avg:99.6 F (37.6 C), Min:98.2 F (36.8 C), Max:100.9 F (38.3 C)   Recent Labs Lab 10/21/15 0016 10/21/15 0017 10/21/15 0335  WBC 16.7*  --   --   CREATININE 0.75  --   --   LATICACIDVEN  --  2.2* 1.8    Estimated Creatinine Clearance: 93.2 mL/min (by C-G formula based on Cr of 0.75).    Allergies  Allergen Reactions  . Penicillins Other (See Comments)    Reaction:  Unknown     Antimicrobials this admission: vancomycin  >>  merpenem  >>   Dose adjustments this admission:   Microbiology results: 5/3 BCx: pending 5/3  UCx: pending  4/11 MRSA PCR: (-)  5/4 UA: (-) 5/4 CXR: no consolidation  Thank you for allowing pharmacy to be a part of this patient's care.  [name] 10/21/2015 5:06 AM

## 2015-10-21 NOTE — Progress Notes (Signed)
Patient ID: Sean [name], male   DOB: [birth year], 50 y.o.   MRN: [id number] [employer] PROGRESS NOTE  Sean [name] WUJ:[id number] DOB: [birth year] DOA: 10/20/2015 PCP: Emogene Morgan, MD  HPI/Subjective: Patient repeats what I say. Unable to get any information out of him. Family at the bedside. They state that he has been having some drainage from the tubes.  Objective: Filed Vitals:   10/21/15 0422 10/21/15 0730  BP: 153/81 153/81  Pulse: 115 120  Temp: 98.2 F (36.8 C) 98.7 F (37.1 C)  Resp: 18     Filed Weights   10/21/15 0008  Weight: 58.968 kg (130 lb)    ROS: Review of Systems  Unable to perform ROS Patient is autistic Exam: Physical Exam  HENT:  Nose: No mucosal edema.  Mouth/Throat: No oropharyngeal exudate or posterior oropharyngeal edema.  Eyes: Conjunctivae, EOM and lids are normal. Pupils are equal, round, and reactive to light.  Neck: No JVD present. Carotid bruit is not present. No edema present. No thyroid mass and no thyromegaly present.  Cardiovascular: S1 normal and S2 normal.  Exam reveals no gallop.   No murmur heard. Pulses:      Dorsalis pedis pulses are 2+ on the right side, and 2+ on the left side.  Respiratory: No respiratory distress. He has no wheezes. He has no rhonchi. He has no rales.  GI: Soft. Bowel sounds are normal. There is no tenderness.  Musculoskeletal:       Right ankle: He exhibits no swelling.       Left ankle: He exhibits no swelling.  Lymphadenopathy:    He has no cervical adenopathy.  Neurological: He is alert.  Skin: Skin is warm. No rash noted. Nails show no clubbing.  Psychiatric: He has a normal mood and affect.      Data Reviewed: Basic Metabolic Panel:  Recent Labs Lab 10/21/15 0016  NA 128*  K 5.1  CL 93*  CO2 26  GLUCOSE 254*  BUN 25*  CREATININE 0.75  CALCIUM 9.0   Liver Function Tests:  Recent Labs Lab 10/21/15 0016  AST 18  ALT 19  ALKPHOS 80  BILITOT 0.1*  PROT 8.1  ALBUMIN 2.8*    CBC:  Recent Labs Lab 10/21/15 0016  WBC 16.7*  NEUTROABS 14.5*  HGB 9.4*  HCT 28.8*  MCV 82.9  PLT 549*    CBG:  Recent Labs Lab 10/21/15 0431 10/21/15 0728 10/21/15 1130  GLUCAP 216* 150* 235*    Recent Results (from the past 240 hour(s))  Blood Culture (routine x 2)     Status: None (Preliminary result)   Collection Time: 10/21/15 12:17 AM  Result Value Ref Range Status   Specimen Description BLOOD LEFT ANTECUBITAL  Final   Special Requests BOTTLES DRAWN AEROBIC AND ANAEROBIC  Final   Culture NO GROWTH < 12 HOURS  Final   Report Status PENDING  Incomplete  Blood Culture (routine x 2)     Status: None (Preliminary result)   Collection Time: 10/21/15 12:50 AM  Result Value Ref Range Status   Specimen Description BLOOD LEFT WRIST  Final   Special Requests BOTTLES DRAWN AEROBIC AND ANAEROBIC  Final   Culture NO GROWTH < 12 HOURS  Final   Report Status PENDING  Incomplete     Studies: Dg Chest 2 View  10/21/2015  CLINICAL DATA:  Febrile.  Chest pain. EXAM: CHEST  2 VIEW COMPARISON:  10/14/2015 FINDINGS: Three left chest tubes appear unchanged in position.  No significant pneumothorax. There may be a very small pleural air collection, manifested by relative hyperlucency adjacent to the aortic knob. No large effusion. Mild atelectatic appearing linear opacities in the left base. The right lung is clear. IMPRESSION: Three chest tubes appear unremarkable, with no significant pneumothorax. No confluent consolidation or large effusion. Electronically Signed   By: Ellery Plunkaniel R Mitchell M.D.   On: 10/21/2015 00:37    Scheduled Meds: . atorvastatin  20 mg Oral QPM  . chlorproMAZINE  10 mg Oral Once  . enoxaparin (LOVENOX) injection  40 mg Subcutaneous Q24H  . insulin aspart  0-9 Units Subcutaneous TID AC & HS  . lisinopril  10 mg Oral Daily  . meropenem (MERREM) IV  1 g Intravenous Q8H  . metoprolol tartrate  12.5 mg Oral BID  . sodium chloride  250 mL Intravenous Once   . sodium chloride flush  3 mL Intravenous Q12H  . vancomycin  1,000 mg Intravenous Q12H   Continuous Infusions: . sodium chloride 75 mL/hr at 10/21/15 0424    Assessment/Plan:  1. Clinical sepsis, empyema, tachycardia and leukocytosis. Patient with aggressive antibiotics of vancomycin and meropenem. So far blood cultures are negative. Continue IV fluid hydration. 2. Hiccups give 1 dose of Thorazine 3. Essential hypertension and tachycardia.*Metoprolol low-dose and continue lisinopril 4. Type 2 diabetes mellitus. Put on sliding scale and start low-dose Lantus 5. History of autism  Code Status:     Code Status Orders        Start     Ordered   10/21/15 0424  Full code   Continuous     10/21/15 0423    Code Status History    Date Active Date Inactive Code Status Order ID Comments User Context   09/24/2015  3:14 PM 10/09/2015  7:40 PM Full Code [id number]  Sylvan CheeseMyrna L Borun, RN Inpatient     Family Communication:  Family at bedside Disposition Plan: social worker [place] [place]. If no beds then family may take home  Consultants:  Cardiothoracic surgery  Antibiotics:  Vancomycin   meropenem  Time spent: 35 minutes  Alford HighlandWIETING, Cebastian Neis  [employer]

## 2015-10-21 NOTE — ED Notes (Signed)
Dr notified of lactic acid level of 2.2

## 2015-10-21 NOTE — ED Notes (Signed)
Family refuses to use a Nurse, learning disabilitytranslator

## 2015-10-21 NOTE — H&P (Signed)
[facility] [place] [facility]   PATIENT NAME: Sean [name]    MR#:  [id number]  DATE OF BIRTH:  [birth year]  DATE OF ADMISSION:  10/20/2015  PRIMARY CARE PHYSICIAN: Emogene MorganAYCOCK, NGWE A, MD   REQUESTING/REFERRING PHYSICIAN: Manson PasseyBrown, MD  CHIEF COMPLAINT:   Chief Complaint  Patient presents with  . Fever    HISTORY OF PRESENT ILLNESS:  Sean [name]  is a 50 y.o. male who presents with left chest wall pain and some pus draining from around one of his chest tubes. Patient is unable to contribute to his history of present illness due to his baseline mental condition. Family states that he complained today of pain on the left side and that his father noticed drainage which looked purulent from around the tube. In the ED here the patient was febrile and had an elevated white blood cell count with bandemia.  PAST MEDICAL HISTORY:   Past Medical History  Diagnosis Date  . Diabetes mellitus without complication (HCC)   . Hypertension   . Mentally disabled   . HLD (hyperlipidemia)     PAST SURGICAL HISTORY:   Past Surgical History  Procedure Laterality Date  . Video bronchoscopy N/A 09/28/2015    Procedure: VIDEO BRONCHOSCOPY;  Surgeon: Hulda Marinimothy Oaks, MD;  Location: [facility] ORS;  Service: Thoracic;  Laterality: N/A;  . Thoracotomy Left 09/28/2015    Procedure: THORACOTOMY MAJOR WITH DECORTICATION;  Surgeon: Hulda Marinimothy Oaks, MD;  Location: [facility] ORS;  Service: Thoracic;  Laterality: Left;    SOCIAL HISTORY:   Social History  Substance Use Topics  . Smoking status: Never Smoker   . Smokeless tobacco: Never Used     Comment: no passive smoke in home  . Alcohol Use: No    FAMILY HISTORY:   Family History  Problem Relation Age of Onset  . Hypertension Mother   . Diabetes Mother   . Hypertension Father   . Diabetes Father     DRUG ALLERGIES:   Allergies  Allergen Reactions  . Penicillins Other (See Comments)    Reaction:  Unknown     MEDICATIONS AT HOME:    Prior to Admission medications   Medication Sig Start Date End Date Taking? Authorizing Provider  atorvastatin (LIPITOR) 20 MG tablet Take 20 mg by mouth every evening.   Yes Historical Provider, MD  insulin glargine (LANTUS) 100 UNIT/ML injection Inject 36 Units into the skin at bedtime.   Yes Historical Provider, MD  lisinopril (PRINIVIL,ZESTRIL) 10 MG tablet Take 10 mg by mouth daily.   Yes Historical Provider, MD  metFORMIN (GLUCOPHAGE) 1000 MG tablet Take 1,000 mg by mouth 2 (two) times daily with a meal.   Yes Historical Provider, MD  ondansetron (ZOFRAN) 4 MG tablet Take 1 tablet (4 mg total) by mouth every 8 (eight) hours as needed for nausea or vomiting. 09/29/15  Yes Gladis Riffleatherine L Loflin, MD  oxyCODONE-acetaminophen (PERCOCET/ROXICET) 5-325 MG tablet Take 1 tablet by mouth every 6 (six) hours as needed for moderate pain. 10/09/15  Yes Houston SirenVivek J Sainani, MD    REVIEW OF SYSTEMS:  ROS   VITAL SIGNS:   Filed Vitals:   10/21/15 0115 10/21/15 0130 10/21/15 0145 10/21/15 0200  BP:      Pulse: 109 108 113 112  Temp:      TempSrc:      Resp: 23 18 19 20   Height:      Weight:      SpO2: 99% 100% 99% 100%   Wt  Readings from Last 3 Encounters:  10/21/15 58.968 kg (130 lb)  10/14/15 57.153 kg (126 lb)  09/28/15 60.7 kg (133 lb 13.1 oz)    PHYSICAL EXAMINATION:  Physical Exam  LABORATORY PANEL:   CBC  Recent Labs Lab 10/21/15 0016  WBC 16.7*  HGB 9.4*  HCT 28.8*  PLT 549*   ------------------------------------------------------------------------------------------------------------------  Chemistries   Recent Labs Lab 10/21/15 0016  NA 128*  K 5.1  CL 93*  CO2 26  GLUCOSE 254*  BUN 25*  CREATININE 0.75  CALCIUM 9.0  AST 18  ALT 19  ALKPHOS 80  BILITOT 0.1*   ------------------------------------------------------------------------------------------------------------------  Cardiac Enzymes No results for input(s): TROPONINI in the last 168  hours. ------------------------------------------------------------------------------------------------------------------  RADIOLOGY:  Dg Chest 2 View  10/21/2015  CLINICAL DATA:  Febrile.  Chest pain. EXAM: CHEST  2 VIEW COMPARISON:  10/14/2015 FINDINGS: Three left chest tubes appear unchanged in position. No significant pneumothorax. There may be a very small pleural air collection, manifested by relative hyperlucency adjacent to the aortic knob. No large effusion. Mild atelectatic appearing linear opacities in the left base. The right lung is clear. IMPRESSION: Three chest tubes appear unremarkable, with no significant pneumothorax. No confluent consolidation or large effusion. Electronically Signed   By: Ellery Plunk M.D.   On: 10/21/2015 00:37    EKG:   Orders placed or performed during the hospital encounter of 10/20/15  . ED EKG 12-Lead  . ED EKG 12-Lead  . EKG 12-Lead  . EKG 12-Lead    IMPRESSION AND PLAN:  Principal Problem:   Sepsis (HCC) - broad spectrum antibiotics started in the ED, we'll continue on admission. Lactic acid mildly elevated, we'll provide fluids and recheck. Active Problems:   Empyema (HCC) - antibiotics as above, chest x-ray does not show reconnection of empyema.   HTN (hypertension) - somewhat elevated now, though this in the setting of DTs. We'll continue his home antihypertensives and provide some when necessary IV antihypertensives.   Type 2 diabetes mellitus (HCC) - sliding scale insulin with corresponding glucose checks   Intellectual disability - family states his disability is autism. No meds for this, his brother typically serves to help with communication.   HLD (hyperlipidemia) - continue home meds  All the records are reviewed and case discussed with ED provider. Management plans discussed with the patient and/or family.  DVT PROPHYLAXIS: SubQ lovenox  GI PROPHYLAXIS: None  ADMISSION STATUS: Inpatient  CODE STATUS: Full Code Status  History    Date Active Date Inactive Code Status Order ID Comments User Context   09/24/2015  3:14 PM 10/09/2015  7:40 PM Full Code [id number]  Sylvan Cheese, RN Inpatient      TOTAL TIME TAKING CARE OF THIS PATIENT: 45 minutes.    Crist Kruszka FIELDING 10/21/2015, 2:12 AM  [facility]  Office  [phone]  CC: Primary care physician; Emogene Morgan, MD

## 2015-10-21 NOTE — Progress Notes (Signed)
Dr Renae GlossWieting made aware of patients heart rate orders received will continue to monitor. Pt denies pain and states he feels fine per His brother.

## 2015-10-21 NOTE — Progress Notes (Signed)
This patient is well known to me. 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 He was admitted to the hospital with a left lower lobe pneumonia and empyema and after unsuccessful percutaneous drainage he underwent surgery several weeks ago. His chest tubes have been managed as an outpatient and I have pulled his tubes back on a couple of occasions. He came in today because his brother state that he's had some significant pain along the posterior tube and there is been some redness and purulent drainage around that. In addition these had a slightly elevated temperature curve as well as a slightly elevated white blood cell count when he presented to the emergency room. He is currently admitted for management of his cellulitis. A chest x-ray obtained in the emergency room look good without evidence of pneumothorax or pleural effusion.  Today the tubes were withdrawn several more centimeters and resecured. There is some redness around the posterior 2 tubes particularly the last one. There is a small amount of purulence draining around it. There is no fluctuance along the thoracotomy wound. There is no fluctuance around the chest tubes as well. There is some slight erythema around the most posterior tube.  I believe that this patient is recovering from his thoracotomy well but has now developed a cellulitis surrounding the posterior chest tube. I do believe that we should treat him with intravenous antibiotics as he does have an elevated white blood cell count and a fever. In addition I instructed the family today on proper care of his wounds. We will continue to advance the tubes once a week. We will wash the area with some Hibiclens are similar wound cleanser daily and keep a sterile dressing around the tubes. I will continue to manage the patient along with you.

## 2015-10-21 NOTE — ED Notes (Signed)
Pt is aware that he needs to give a urine sample - family is interpreting - he is unable to void and has been since he arrived - attempted in and out cath with no results - giving diet ginger ale at this time to attempt and see if pt can void

## 2015-10-21 NOTE — Accreditation Note (Signed)
Patient arrived to the unit unable to obtain admission questions due to language barrier. No signs of distress

## 2015-10-22 LAB — BASIC METABOLIC PANEL
Anion gap: 10 (ref 5–15)
BUN: 15 mg/dL (ref 6–20)
CHLORIDE: 96 mmol/L — AB (ref 101–111)
CO2: 27 mmol/L (ref 22–32)
CREATININE: 0.64 mg/dL (ref 0.61–1.24)
Calcium: 8.8 mg/dL — ABNORMAL LOW (ref 8.9–10.3)
GFR calc Af Amer: >60 mL/min (ref >60)
GFR calc non Af Amer: >60 mL/min (ref >60)
Glucose, Bld: 150 mg/dL — ABNORMAL HIGH (ref 65–99)
POTASSIUM: 4.2 mmol/L (ref 3.5–5.1)
Sodium: 133 mmol/L — ABNORMAL LOW (ref 135–145)

## 2015-10-22 LAB — BLOOD CULTURE ID PANEL (REFLEXED)
ACINETOBACTER BAUMANNII: NOT DETECTED
CANDIDA ALBICANS: NOT DETECTED
CANDIDA GLABRATA: NOT DETECTED
CANDIDA KRUSEI: NOT DETECTED
CANDIDA PARAPSILOSIS: NOT DETECTED
CARBAPENEM RESISTANCE: NOT DETECTED
Candida tropicalis: NOT DETECTED
ENTEROBACTER CLOACAE COMPLEX: NOT DETECTED
ENTEROBACTERIACEAE SPECIES: NOT DETECTED
ESCHERICHIA COLI: NOT DETECTED
Enterococcus species: NOT DETECTED
Haemophilus influenzae: NOT DETECTED
KLEBSIELLA OXYTOCA: NOT DETECTED
KLEBSIELLA PNEUMONIAE: NOT DETECTED
Listeria monocytogenes: NOT DETECTED
Methicillin resistance: DETECTED — AB
NEISSERIA MENINGITIDIS: NOT DETECTED
PSEUDOMONAS AERUGINOSA: NOT DETECTED
Proteus species: NOT DETECTED
STAPHYLOCOCCUS SPECIES: DETECTED — AB
STREPTOCOCCUS AGALACTIAE: NOT DETECTED
STREPTOCOCCUS PNEUMONIAE: NOT DETECTED
STREPTOCOCCUS SPECIES: NOT DETECTED
Serratia marcescens: NOT DETECTED
Staphylococcus aureus (BCID): NOT DETECTED
Streptococcus pyogenes: NOT DETECTED
Vancomycin resistance: NOT DETECTED

## 2015-10-22 LAB — GLUCOSE, CAPILLARY
GLUCOSE-CAPILLARY: 264 mg/dL — AB (ref 65–99)
Glucose-Capillary: 138 mg/dL — ABNORMAL HIGH (ref 65–99)
Glucose-Capillary: 357 mg/dL — ABNORMAL HIGH (ref 65–99)
Glucose-Capillary: 403 mg/dL — ABNORMAL HIGH (ref 65–99)

## 2015-10-22 LAB — CBC
HEMATOCRIT: 31.2 % — AB (ref 40.0–52.0)
HEMOGLOBIN: 10.4 g/dL — AB (ref 13.0–18.0)
MCH: 27.9 pg (ref 26.0–34.0)
MCHC: 33.2 g/dL (ref 32.0–36.0)
MCV: 84.1 fL (ref 80.0–100.0)
Platelets: 504 10*3/uL — ABNORMAL HIGH (ref 150–440)
RBC: 3.71 MIL/uL — AB (ref 4.40–5.90)
RDW: 15.1 % — ABNORMAL HIGH (ref 11.5–14.5)
WBC: 10.8 10*3/uL — ABNORMAL HIGH (ref 3.8–10.6)

## 2015-10-22 LAB — URINE CULTURE: CULTURE: NO GROWTH

## 2015-10-22 LAB — VANCOMYCIN, TROUGH: Vancomycin Tr: 12 ug/mL (ref 10–20)

## 2015-10-22 MED ORDER — VANCOMYCIN HCL IN DEXTROSE 1-5 GM/200ML-% IV SOLN
1000.0000 mg | Freq: Three times a day (TID) | INTRAVENOUS | Status: DC
  Administered 2015-10-22 – 2015-10-23 (×3): 1000 mg via INTRAVENOUS
  Filled 2015-10-22 (×6): qty 200

## 2015-10-22 MED ORDER — METOPROLOL TARTRATE 25 MG PO TABS
25.0000 mg | ORAL_TABLET | Freq: Two times a day (BID) | ORAL | Status: DC
  Administered 2015-10-22 – 2015-10-25 (×6): 25 mg via ORAL
  Filled 2015-10-22 (×6): qty 1

## 2015-10-22 MED ORDER — INSULIN GLARGINE 100 UNIT/ML ~~LOC~~ SOLN
20.0000 [IU] | Freq: Every day | SUBCUTANEOUS | Status: DC
  Administered 2015-10-22 – 2015-10-23 (×2): 20 [IU] via SUBCUTANEOUS
  Filled 2015-10-22 (×2): qty 0.2

## 2015-10-22 NOTE — Consult Note (Signed)
[facility] Infectious Disease     Reason for Consult:fever, abscess   Referring Physician: sainin Date of Admission:  10/20/2015   Principal Problem:   Sepsis ([place]) Active Problems:   Empyema ([place])   Intellectual disability   HTN (hypertension)   HLD (hyperlipidemia)   Type 2 diabetes mellitus (HCC)   HPI: Sean [name] is a 50 y.o. male with recent PNA and empyema s/p  left thoracotomy for decortication of his lung. He was discharge with chest tubes which have been Dr Genevive Bi managed as an outpatient . Now admitted 5/3 with some drainage and redness around one tube and elevated wbc, low grade temp. A chest x-ray obtained in the emergency room look good without evidence of pneumothorax or pleural effusion Started on meropenem and vanco. Cx with Staph aureus from incision and Likely Coag neg staph from blood cx (probably contaminant). Defervesced. Wbc decreasing  Past Medical History  Diagnosis Date  . Diabetes mellitus without complication ([place])   . Hypertension   . Mentally disabled   . HLD (hyperlipidemia)    Past Surgical History  Procedure Laterality Date  . Video bronchoscopy N/A 09/28/2015    Procedure: VIDEO BRONCHOSCOPY;  Surgeon: Nestor Lewandowsky, MD;  Location: [facility] ORS;  Service: Thoracic;  Laterality: N/A;  . Thoracotomy Left 09/28/2015    Procedure: THORACOTOMY MAJOR WITH DECORTICATION;  Surgeon: Nestor Lewandowsky, MD;  Location: [facility] ORS;  Service: Thoracic;  Laterality: Left;   Social History  Substance Use Topics  . Smoking status: Never Smoker   . Smokeless tobacco: Never Used     Comment: no passive smoke in home  . Alcohol Use: No   Family History  Problem Relation Age of Onset  . Hypertension Mother   . Diabetes Mother   . Hypertension Father   . Diabetes Father     Allergies:  Allergies  Allergen Reactions  . Penicillins Other (See Comments)    Reaction:  Unknown     Current antibiotics: Antibiotics Given (last 72 hours)    Date/Time Action Medication  Dose Rate   10/21/15 0623 Given   meropenem (MERREM) 1 g in sodium chloride 0.9 % 100 mL IVPB 1 g 200 mL/hr   10/21/15 1115 Given   vancomycin (VANCOCIN) IVPB 1000 mg/200 mL premix 1,000 mg 200 mL/hr   10/21/15 1528 Given   meropenem (MERREM) 1 g in sodium chloride 0.9 % 100 mL IVPB 1 g 200 mL/hr   10/21/15 1924 Given   vancomycin (VANCOCIN) IVPB 1000 mg/200 mL premix 1,000 mg 200 mL/hr   10/21/15 2105 Given   meropenem (MERREM) 1 g in sodium chloride 0.9 % 100 mL IVPB 1 g 200 mL/hr   10/22/15 0510 Given   meropenem (MERREM) 1 g in sodium chloride 0.9 % 100 mL IVPB 1 g 200 mL/hr   10/22/15 0826 Given   vancomycin (VANCOCIN) IVPB 1000 mg/200 mL premix 1,000 mg 200 mL/hr   10/22/15 1509 Given   meropenem (MERREM) 1 g in sodium chloride 0.9 % 100 mL IVPB 1 g 200 mL/hr      MEDICATIONS: . atorvastatin  20 mg Oral QPM  . chlorproMAZINE  10 mg Oral Once  . enoxaparin (LOVENOX) injection  40 mg Subcutaneous Q24H  . insulin aspart  0-9 Units Subcutaneous TID AC & HS  . meropenem (MERREM) IV  1 g Intravenous Q8H  . metoprolol tartrate  25 mg Oral BID  . sodium chloride  250 mL Intravenous Once  . sodium chloride flush  3  mL Intravenous Q12H  . vancomycin  1,000 mg Intravenous Q12H    Review of Systems - unable to obtain   OBJECTIVE: Temp:  [97.5 F (36.4 C)-99 F (37.2 C)] 97.5 F (36.4 C) (05/05 1523) Pulse Rate:  [110-126] 116 (05/05 1523) Resp:  [16-20] 16 (05/05 1523) BP: (114-152)/(61-73) 114/63 mmHg (05/05 1523) SpO2:  [97 %-98 %] 98 % (05/05 1523) Physical Exam  Constitutional: thin, frail HENT: anictric Mouth/Throat: Oropharynx is clear and moist. No oropharyngeal exudate.  Cardiovascular: Normal rate, regular rhythm and normal heart sounds.  Pulmonary/Chest: Effort normal and breath sounds normal. No respiratory distress. He has no wheezes L chest wall with 3 empyema tubes in place, mild erythema and purulence .  Abdominal: Soft. Bowel sounds are normal. He exhibits  no distension. There is no tenderness.  Lymphadenopathy: He has no cervical adenopathy.  Neurological: alert Skin: Skin is warm and dry. As above Psychiatric: He has a normal mood and affect. His behavior is normal.     LABS: Results for orders placed or performed during the hospital encounter of 10/20/15 (from the past 48 hour(s))  Comprehensive metabolic panel     Status: Abnormal   Collection Time: 10/21/15 12:16 AM  Result Value Ref Range   Sodium 128 (L) 135 - 145 mmol/L   Potassium 5.1 3.5 - 5.1 mmol/L   Chloride 93 (L) 101 - 111 mmol/L   CO2 26 22 - 32 mmol/L   Glucose, Bld 254 (H) 65 - 99 mg/dL   BUN 25 (H) 6 - 20 mg/dL   Creatinine, Ser 0.75 0.61 - 1.24 mg/dL   Calcium 9.0 8.9 - 10.3 mg/dL   Total Protein 8.1 6.5 - 8.1 g/dL   Albumin 2.8 (L) 3.5 - 5.0 g/dL   AST 18 15 - 41 U/L   ALT 19 17 - 63 U/L   Alkaline Phosphatase 80 38 - 126 U/L   Total Bilirubin 0.1 (L) 0.3 - 1.2 mg/dL   GFR calc non Af Amer >60 >60 mL/min   GFR calc Af Amer >60 >60 mL/min    Comment: (NOTE) The eGFR has been calculated using the CKD EPI equation. This calculation has not been validated in all clinical situations. eGFR's persistently <60 mL/min signify possible Chronic Kidney Disease.    Anion gap 9 5 - 15  CBC WITH DIFFERENTIAL     Status: Abnormal   Collection Time: 10/21/15 12:16 AM  Result Value Ref Range   WBC 16.7 (H) 3.8 - 10.6 K/uL   RBC 3.47 (L) 4.40 - 5.90 MIL/uL   Hemoglobin 9.4 (L) 13.0 - 18.0 g/dL   HCT 28.8 (L) 40.0 - 52.0 %   MCV 82.9 80.0 - 100.0 fL   MCH 27.1 26.0 - 34.0 pg   MCHC 32.7 32.0 - 36.0 g/dL   RDW 15.0 (H) 11.5 - 14.5 %   Platelets 549 (H) 150 - 440 K/uL   Neutrophils Relative % 86% %   Neutro Abs 14.5 (H) 1.4 - 6.5 K/uL   Lymphocytes Relative 8% %   Lymphs Abs 1.3 1.0 - 3.6 K/uL   Monocytes Relative 5% %   Monocytes Absolute 0.8 0.2 - 1.0 K/uL   Eosinophils Relative 0% %   Eosinophils Absolute 0.0 0 - 0.7 K/uL   Basophils Relative 1% %   Basophils  Absolute 0.1 0 - 0.1 K/uL  Hemoglobin A1c     Status: Abnormal   Collection Time: 10/21/15 12:16 AM  Result Value Ref Range   Hgb  A1c MFr Bld 9.3 (H) 4.0 - 6.0 %  Blood Culture (routine x 2)     Status: None (Preliminary result)   Collection Time: 10/21/15 12:17 AM  Result Value Ref Range   Specimen Description BLOOD LEFT ANTECUBITAL    Special Requests BOTTLES DRAWN AEROBIC AND ANAEROBIC 5ML    Culture NO GROWTH 1 DAY    Report Status PENDING   Lactic acid, plasma     Status: Abnormal   Collection Time: 10/21/15 12:17 AM  Result Value Ref Range   Lactic Acid, Venous 2.2 (HH) 0.5 - 2.0 mmol/L    Comment: CRITICAL RESULT CALLED TO, READ BACK BY AND VERIFIED WITH TERESA HUDSON @ 0100 ON 10/21/2015 BY [facility]   Blood Culture (routine x 2)     Status: Abnormal (Preliminary result)   Collection Time: 10/21/15 12:50 AM  Result Value Ref Range   Specimen Description BLOOD LEFT WRIST    Special Requests BOTTLES DRAWN AEROBIC AND ANAEROBIC 5ML    Culture  Setup Time      GRAM POSITIVE COCCI AEROBIC BOTTLE ONLY CRITICAL RESULT CALLED TO, READ BACK BY AND VERIFIED WITH: Sim Boast @ [phone] 10/22/15 by [facility]    Culture STAPHYLOCOCCUS SPECIES AEROBIC BOTTLE ONLY (A)    Report Status PENDING   Blood Culture ID Panel (Reflexed)     Status: Abnormal   Collection Time: 10/21/15 12:50 AM  Result Value Ref Range   Enterococcus species NOT DETECTED NOT DETECTED   Vancomycin resistance NOT DETECTED NOT DETECTED   Listeria monocytogenes NOT DETECTED NOT DETECTED   Staphylococcus species DETECTED (A) NOT DETECTED    Comment: CRITICAL RESULT CALLED TO, READ BACK BY AND VERIFIED WITH: Matt Mcbane @ [phone] 10/22/15 by [place]    Staphylococcus aureus NOT DETECTED NOT DETECTED   Methicillin resistance DETECTED (A) NOT DETECTED    Comment: CRITICAL RESULT CALLED TO, READ BACK BY AND VERIFIED WITH: Sim Boast @ [phone] 10/22/15 by [place]    Streptococcus species NOT DETECTED NOT DETECTED   Streptococcus agalactiae NOT  DETECTED NOT DETECTED   Streptococcus pneumoniae NOT DETECTED NOT DETECTED   Streptococcus pyogenes NOT DETECTED NOT DETECTED   Acinetobacter baumannii NOT DETECTED NOT DETECTED   Enterobacteriaceae species NOT DETECTED NOT DETECTED   Enterobacter cloacae complex NOT DETECTED NOT DETECTED   Escherichia coli NOT DETECTED NOT DETECTED   Klebsiella oxytoca NOT DETECTED NOT DETECTED   Klebsiella pneumoniae NOT DETECTED NOT DETECTED   Proteus species NOT DETECTED NOT DETECTED   Serratia marcescens NOT DETECTED NOT DETECTED   Carbapenem resistance NOT DETECTED NOT DETECTED   Haemophilus influenzae NOT DETECTED NOT DETECTED   Neisseria meningitidis NOT DETECTED NOT DETECTED   Pseudomonas aeruginosa NOT DETECTED NOT DETECTED   Candida albicans NOT DETECTED NOT DETECTED   Candida glabrata NOT DETECTED NOT DETECTED   Candida krusei NOT DETECTED NOT DETECTED   Candida parapsilosis NOT DETECTED NOT DETECTED   Candida tropicalis NOT DETECTED NOT DETECTED  Urine culture     Status: None   Collection Time: 10/21/15  2:40 AM  Result Value Ref Range   Specimen Description URINE, RANDOM    Special Requests NONE    Culture NO GROWTH 1 DAY    Report Status 10/22/2015 FINAL   Urinalysis complete, with microscopic ([place] only)     Status: Abnormal   Collection Time: 10/21/15  2:40 AM  Result Value Ref Range   Color, Urine STRAW (A) YELLOW   APPearance CLEAR (A) CLEAR   Glucose, UA >500 (  A) NEGATIVE mg/dL   Bilirubin Urine NEGATIVE NEGATIVE   Ketones, ur NEGATIVE NEGATIVE mg/dL   Specific Gravity, Urine 1.003 (L) 1.005 - 1.030   Hgb urine dipstick NEGATIVE NEGATIVE   pH 6.0 5.0 - 8.0   Protein, ur NEGATIVE NEGATIVE mg/dL   Nitrite NEGATIVE NEGATIVE   Leukocytes, UA NEGATIVE NEGATIVE   RBC / HPF NONE SEEN 0 - 5 RBC/hpf   WBC, UA 0-5 0 - 5 WBC/hpf   Bacteria, UA NONE SEEN NONE SEEN   Squamous Epithelial / LPF NONE SEEN NONE SEEN  Lactic acid, plasma     Status: None   Collection Time: 10/21/15   3:35 AM  Result Value Ref Range   Lactic Acid, Venous 1.8 0.5 - 2.0 mmol/L  Glucose, capillary     Status: Abnormal   Collection Time: 10/21/15  4:31 AM  Result Value Ref Range   Glucose-Capillary 216 (H) 65 - 99 mg/dL   Comment 1 Notify RN   Glucose, capillary     Status: Abnormal   Collection Time: 10/21/15  7:28 AM  Result Value Ref Range   Glucose-Capillary 150 (H) 65 - 99 mg/dL  Glucose, capillary     Status: Abnormal   Collection Time: 10/21/15 11:30 AM  Result Value Ref Range   Glucose-Capillary 235 (H) 65 - 99 mg/dL  Wound culture     Status: None (Preliminary result)   Collection Time: 10/21/15  1:51 PM  Result Value Ref Range   Specimen Description ABSCESS    Special Requests Normal    Gram Stain PENDING    Culture      MODERATE GROWTH STAPHYLOCOCCUS AUREUS SUSCEPTIBILITIES TO FOLLOW    Report Status PENDING   Glucose, capillary     Status: Abnormal   Collection Time: 10/21/15  4:01 PM  Result Value Ref Range   Glucose-Capillary 186 (H) 65 - 99 mg/dL  Glucose, capillary     Status: Abnormal   Collection Time: 10/21/15  9:03 PM  Result Value Ref Range   Glucose-Capillary 411 (H) 65 - 99 mg/dL  Glucose, capillary     Status: Abnormal   Collection Time: 10/21/15  9:07 PM  Result Value Ref Range   Glucose-Capillary 406 (H) 65 - 99 mg/dL  Basic metabolic panel     Status: Abnormal   Collection Time: 10/22/15  3:23 AM  Result Value Ref Range   Sodium 133 (L) 135 - 145 mmol/L   Potassium 4.2 3.5 - 5.1 mmol/L   Chloride 96 (L) 101 - 111 mmol/L   CO2 27 22 - 32 mmol/L   Glucose, Bld 150 (H) 65 - 99 mg/dL   BUN 15 6 - 20 mg/dL   Creatinine, Ser 0.64 0.61 - 1.24 mg/dL   Calcium 8.8 (L) 8.9 - 10.3 mg/dL   GFR calc non Af Amer >60 >60 mL/min   GFR calc Af Amer >60 >60 mL/min    Comment: (NOTE) The eGFR has been calculated using the CKD EPI equation. This calculation has not been validated in all clinical situations. eGFR's persistently <60 mL/min signify possible  Chronic Kidney Disease.    Anion gap 10 5 - 15  CBC     Status: Abnormal   Collection Time: 10/22/15  3:23 AM  Result Value Ref Range   WBC 10.8 (H) 3.8 - 10.6 K/uL   RBC 3.71 (L) 4.40 - 5.90 MIL/uL   Hemoglobin 10.4 (L) 13.0 - 18.0 g/dL   HCT 31.2 (L) 40.0 - 52.0 %  MCV 84.1 80.0 - 100.0 fL   MCH 27.9 26.0 - 34.0 pg   MCHC 33.2 32.0 - 36.0 g/dL   RDW 15.1 (H) 11.5 - 14.5 %   Platelets 504 (H) 150 - 440 K/uL  Glucose, capillary     Status: Abnormal   Collection Time: 10/22/15  7:47 AM  Result Value Ref Range   Glucose-Capillary 138 (H) 65 - 99 mg/dL  Glucose, capillary     Status: Abnormal   Collection Time: 10/22/15 11:25 AM  Result Value Ref Range   Glucose-Capillary 264 (H) 65 - 99 mg/dL   No components found for: ESR, C REACTIVE PROTEIN MICRO: Recent Results (from the past 720 hour(s))  MRSA PCR Screening     Status: None   Collection Time: 09/22/15  3:42 PM  Result Value Ref Range Status   MRSA by PCR NEGATIVE NEGATIVE Final    Comment:        The GeneXpert MRSA Assay (FDA approved for NASAL specimens only), is one component of a comprehensive MRSA colonization surveillance program. It is not intended to diagnose MRSA infection nor to guide or monitor treatment for MRSA infections.   Culture, body fluid-bottle     Status: None   Collection Time: 09/23/15  3:15 PM  Result Value Ref Range Status   Specimen Description FLUID  Final   Special Requests NONE  Final   Gram Stain   Final    MANY WBC SEEN MANY GRAM POSITIVE COCCI IN CHAINS    Culture NO GROWTH 5 DAYS UNABLE TO [place]  Final   Report Status 09/30/2015 FINAL  Final  Tissue culture     Status: None   Collection Time: 09/28/15  3:29 PM  Result Value Ref Range Status   Specimen Description TISSUE  Final   Special Requests NONE  Final   Culture No growth aerobically or anaerobically.  Final   Report Status 10/03/2015 FINAL  Final  MRSA PCR Screening     Status: None   Collection Time: 09/28/15   8:47 PM  Result Value Ref Range Status   MRSA by PCR NEGATIVE NEGATIVE Final    Comment:        The GeneXpert MRSA Assay (FDA approved for NASAL specimens only), is one component of a comprehensive MRSA colonization surveillance program. It is not intended to diagnose MRSA infection nor to guide or monitor treatment for MRSA infections.   Blood Culture (routine x 2)     Status: None (Preliminary result)   Collection Time: 10/21/15 12:17 AM  Result Value Ref Range Status   Specimen Description BLOOD LEFT ANTECUBITAL  Final   Special Requests BOTTLES DRAWN AEROBIC AND ANAEROBIC 5ML  Final   Culture NO GROWTH 1 DAY  Final   Report Status PENDING  Incomplete  Blood Culture (routine x 2)     Status: Abnormal (Preliminary result)   Collection Time: 10/21/15 12:50 AM  Result Value Ref Range Status   Specimen Description BLOOD LEFT WRIST  Final   Special Requests BOTTLES DRAWN AEROBIC AND ANAEROBIC 5ML  Final   Culture  Setup Time   Final    GRAM POSITIVE COCCI AEROBIC BOTTLE ONLY CRITICAL RESULT CALLED TO, READ BACK BY AND VERIFIED WITH: Matt Mcbane @ 1031 10/22/15 by [facility]    Culture STAPHYLOCOCCUS SPECIES AEROBIC BOTTLE ONLY (A)  Final   Report Status PENDING  Incomplete  Blood Culture ID Panel (Reflexed)     Status: Abnormal   Collection Time: 10/21/15 12:50 AM  Result Value Ref Range Status   Enterococcus species NOT DETECTED NOT DETECTED Final   Vancomycin resistance NOT DETECTED NOT DETECTED Final   Listeria monocytogenes NOT DETECTED NOT DETECTED Final   Staphylococcus species DETECTED (A) NOT DETECTED Final    Comment: CRITICAL RESULT CALLED TO, READ BACK BY AND VERIFIED WITH: Matt Mcbane @ [phone] 10/22/15 by [place]    Staphylococcus aureus NOT DETECTED NOT DETECTED Final   Methicillin resistance DETECTED (A) NOT DETECTED Final    Comment: CRITICAL RESULT CALLED TO, READ BACK BY AND VERIFIED WITH: Sim Boast @ [phone] 10/22/15 by [place]    Streptococcus species NOT DETECTED NOT  DETECTED Final   Streptococcus agalactiae NOT DETECTED NOT DETECTED Final   Streptococcus pneumoniae NOT DETECTED NOT DETECTED Final   Streptococcus pyogenes NOT DETECTED NOT DETECTED Final   Acinetobacter baumannii NOT DETECTED NOT DETECTED Final   Enterobacteriaceae species NOT DETECTED NOT DETECTED Final   Enterobacter cloacae complex NOT DETECTED NOT DETECTED Final   Escherichia coli NOT DETECTED NOT DETECTED Final   Klebsiella oxytoca NOT DETECTED NOT DETECTED Final   Klebsiella pneumoniae NOT DETECTED NOT DETECTED Final   Proteus species NOT DETECTED NOT DETECTED Final   Serratia marcescens NOT DETECTED NOT DETECTED Final   Carbapenem resistance NOT DETECTED NOT DETECTED Final   Haemophilus influenzae NOT DETECTED NOT DETECTED Final   Neisseria meningitidis NOT DETECTED NOT DETECTED Final   Pseudomonas aeruginosa NOT DETECTED NOT DETECTED Final   Candida albicans NOT DETECTED NOT DETECTED Final   Candida glabrata NOT DETECTED NOT DETECTED Final   Candida krusei NOT DETECTED NOT DETECTED Final   Candida parapsilosis NOT DETECTED NOT DETECTED Final   Candida tropicalis NOT DETECTED NOT DETECTED Final  Urine culture     Status: None   Collection Time: 10/21/15  2:40 AM  Result Value Ref Range Status   Specimen Description URINE, RANDOM  Final   Special Requests NONE  Final   Culture NO GROWTH 1 DAY  Final   Report Status 10/22/2015 FINAL  Final  Wound culture     Status: None (Preliminary result)   Collection Time: 10/21/15  1:51 PM  Result Value Ref Range Status   Specimen Description ABSCESS  Final   Special Requests Normal  Final   Gram Stain PENDING  Incomplete   Culture   Final    MODERATE GROWTH STAPHYLOCOCCUS AUREUS SUSCEPTIBILITIES TO FOLLOW    Report Status PENDING  Incomplete    IMAGING: Dg Chest 1 View  10/01/2015  CLINICAL DATA:  Evaluate post thoracotomy EXAM: CHEST 1 VIEW COMPARISON:  09/29/2015 FINDINGS: Left chest tubes remain in place. No pneumothorax.  Very low lung volumes with right basilar atelectasis and patchy opacities throughout the left lung, stable since prior study. Heart is borderline in size. IMPRESSION: Very low lung volumes with diffuse left lung and right basilar airspace opacities, likely atelectasis. Left chest tubes without pneumothorax. Note real change since prior study. Electronically Signed   By: Rolm Baptise M.D.   On: 10/01/2015 08:55   X-ray Chest Pa Or Ap  09/28/2015  CLINICAL DATA:  Status post bronchoscopy and thoracotomy EXAM: CHEST 1 VIEW COMPARISON:  09/27/2015 FINDINGS: Cardiac shadow is stable. Left-sided chest tubes are now seen. Lateral left pneumothorax is noted inferiorly. Some left basilar atelectasis is seen. A surgical drain is noted in the lateral chest wall. The right lung remains clear. The bony structures are within normal limits. IMPRESSION: Status post left thoracotomy with inferior lateral pneumothorax noted. Left basilar  atelectasis is seen as well. Electronically Signed   By: Inez Catalina M.D.   On: 09/28/2015 18:23   Dg Chest 1 View  09/27/2015  CLINICAL DATA:  Pleural effusion. EXAM: CHEST 1 VIEW COMPARISON:  09/26/2015. FINDINGS: Left chest drainage catheter in stable position. Mediastinum and hilar structures are normal. Persistent left lower lobe infiltrate left pleural effusion again noted. Persistent pleural-based density noted over the left upper chest most likely loculated pleural effusion. No interim change. Low lung volumes. No pneumothorax. IMPRESSION: 1. Left chest drainage catheter in stable position. Persistent small left pleural effusion. Persistent pleural-based density left upper chest consistent with small loculated effusion. 2.  Persistent left lower lobe infiltrate. Electronically Signed   By: Marcello Moores  Register   On: 09/27/2015 07:25   Dg Chest 1 View  09/26/2015  CLINICAL DATA:  50 year old with cavitary left lower lobe pneumonia and left parapneumonic effusion/empyema of with indwelling  drainage catheter. EXAM: Portable CHEST 1 VIEW COMPARISON:  09/24/2015 and earlier. FINDINGS: Interval repositioning of the pleural drainage catheter so stenosis now at the base of the left hemithorax. Persistent loculated left pleural effusion in the upper lateral hemithorax, axillary region, unchanged. Dense left lower lobe consolidation, unchanged. Suboptimal inspiration with atelectasis in the right lower lobe, unchanged. No new pulmonary parenchymal abnormalities. Cardiac silhouette upper normal in size for technique and degree of inspiration. IMPRESSION: 1. Stable dense left lower lobe pneumonia. 2. Stable loculated effusion in the upper lateral left hemithorax. 3. Stable right lower lobe atelectasis related to suboptimal inspiration. 4. No new abnormalities. Electronically Signed   By: Evangeline Dakin M.D.   On: 09/26/2015 08:05   Dg Chest 2 View  10/21/2015  CLINICAL DATA:  Febrile.  Chest pain. EXAM: CHEST  2 VIEW COMPARISON:  10/14/2015 FINDINGS: Three left chest tubes appear unchanged in position. No significant pneumothorax. There may be a very small pleural air collection, manifested by relative hyperlucency adjacent to the aortic knob. No large effusion. Mild atelectatic appearing linear opacities in the left base. The right lung is clear. IMPRESSION: Three chest tubes appear unremarkable, with no significant pneumothorax. No confluent consolidation or large effusion. Electronically Signed   By: Andreas Newport M.D.   On: 10/21/2015 00:37   Dg Chest 2 View  10/14/2015  CLINICAL DATA:  Status post thoracoscopy and left thoracotomy and decortication on April 17th ; possible malpositioning of chest tubes. EXAM: CHEST  2 VIEW COMPARISON:  Chest x-ray of October 08, 2015 FINDINGS: There remain 3 chest tubes on the left. Positioning appears stable. The upper chest tube tip projects over the posterior medial aspect of the third rib. The middle chest tube tip projects over the medial interspace between  the eighth and ninth ribs. The lower chest tube tip projects in the lateral interspace between the tenth and eleventh ribs. There is a trace of pleural fluid present. No pneumothorax is observed. The right lung is clear. There is no shift of the mediastinum. The bony structures are unremarkable. IMPRESSION: Stable positioning of the 3 left-sided chest tubes. No pneumothorax. Only a small amount of pleural fluid remains. Electronically Signed   By: Maisley Hainsworth  [place] M.D.   On: 10/14/2015 08:41   Dg Chest 2 View  10/08/2015  CLINICAL DATA:  Postop common left chest tubes EXAM: CHEST  2 VIEW COMPARISON:  10/05/2015 FINDINGS: Left chest tubes remain in place. No pneumothorax. Left base atelectasis. Right lung is clear. Heart is normal size. IMPRESSION: Left chest tubes remain in place without visible pneumothorax.  Left base atelectasis. Electronically Signed   By: Rolm Baptise M.D.   On: 10/08/2015 10:50   Dg Chest 2 View  10/05/2015  CLINICAL DATA:  Empyema. EXAM: CHEST  2 VIEW COMPARISON:  10/02/2015 FINDINGS: AP and lateral views of the chest show persistence of the 3 left thoracotomy tubes, stable in position. Soft tissue drain in the left lateral chest wall remains in place. No evidence for pneumothorax. There is left base collapse/consolidation, but this is improved since prior study. Right lung remains clear. Cardiopericardial silhouette is at upper limits of normal for size. IMPRESSION: Stable position of left-sided chest tubes with improving aeration at the left lung base. No evidence for pneumothorax. Electronically Signed   By: Misty Stanley M.D.   On: 10/05/2015 14:16   Dg Chest 2 View  10/02/2015  CLINICAL DATA:  F/u post op day 4 left thoracotomy, left chest tube EXAM: CHEST  2 VIEW COMPARISON:  10/01/2015 FINDINGS: Motion and position degraded lateral view. 3 left-sided chest tubes are similar. Midline trachea. Normal heart size. Small volume left-sided pleural fluid, improved. No pneumothorax.  Improved bibasilar airspace disease, worse on the left. IMPRESSION: Left-sided chest tubes remaining in place with decreased pleural fluid and no pneumothorax. Improved bibasilar Airspace disease, likely atelectasis. Electronically Signed   By: Abigail Miyamoto M.D.   On: 10/02/2015 16:04   Dg Chest 2 View  09/24/2015  CLINICAL DATA:  Left-sided chest tube. EXAM: CHEST  2 VIEW COMPARISON:  September 22, 2015. FINDINGS: Stable cardiomediastinal silhouette. No pneumothorax is noted. Interval placement of left-sided pleural drainage catheter. Right lung is clear. Left basilar opacity is noted concerning for pneumonia or atelectasis with associated pleural effusion which is slightly improved compared to prior exam. Bony thorax is unremarkable. IMPRESSION: Interval placement of left-sided pleural drainage catheter. Slightly improved left basilar opacity as described above. Electronically Signed   By: Marijo Conception, M.D.   On: 09/24/2015 08:21   Dg Chest Port 1 View  09/29/2015  CLINICAL DATA:  Postop.  Recent left lower lobe pneumonia. EXAM: PORTABLE CHEST 1 VIEW COMPARISON:  09/28/2015; 09/27/2015; 09/21/2015; chest CT -09/21/2015 FINDINGS: Grossly unchanged cardiac silhouette and mediastinal contours. Stable positioning of support apparatus. No pneumothorax. Small loculated left-sided effusion with associated left mid and lower lung heterogeneous/consolidative opacities are unchanged. No new focal airspace opacities. Unchanged bones. IMPRESSION: 1.  Stable positioning of support apparatus.  No pneumothorax. 2. Unchanged small loculated left-sided effusion with associated left mid and lower lung heterogeneous/consolidative opacities, atelectasis versus infiltrate. Electronically Signed   By: Sandi Mariscal M.D.   On: 09/29/2015 09:03   Ct Image Guided Drainage By Percutaneous Catheter  09/24/2015  INDICATION: LEFT EMPYEMA, STATUS POST CT CHEST DRAIN yesterday, malpositioned, decreased output EXAM: CT IMAGE GUIDED DRAINAGE  BY PERCUTANEOUS CATHETER MEDICATIONS: The patient is currently admitted to the hospital and receiving intravenous antibiotics. The antibiotics were administered within an appropriate time frame prior to the initiation of the procedure. ANESTHESIA/SEDATION: Fentanyl 100 mcg IV; Versed  3 mg IV Moderate Sedation Time:  32 The patient was continuously monitored during the procedure by the interventional radiology nurse under my direct supervision. COMPLICATIONS: None immediate. PROCEDURE: Informed written consent was obtained from the the patient's brother after a thorough discussion of the procedural risks, benefits and alternatives. All questions were addressed. Maximal Sterile Barrier Technique was utilized including caps, mask, sterile gowns, sterile gloves, sterile drape, hand hygiene and skin antiseptic. A timeout was performed prior to the initiation of the procedure. Patient  was positioned prone. Noncontrast localization CT performed. The previously inserted posterior left mid thoracic chest drain has retracted and is partially coiled in the soft tissues. The posterior loculated pleural fluid the catheter was originally inserted and has resolved. Therefore this catheter was removed over a guidewire. Imaging performed of the chest more inferiorly. Residual pleural fluid localized more inferiorly. Under sterile conditions and local anesthesia from a left inferior lateral approach, an 18 gauge needle was advanced into the residual pleural air and fluid more inferiorly. There was return of serosanguineous yellow fluid. Guidewire inserted followed by tract dilatation to insert a 10 [place] drain. Drain catheter position confirmed with CT. Syringe aspiration yielded blood-tinged pleural fluid. Catheter secured with a Prolene suture and connected to low wall suction through a pleura vac at 20 cm water. IMPRESSION: Uncomplicated removal of the retracted malpositioned left mid thoracic posterior chest drain. Successful  CT-guided insertion of a left inferior lateral new chest tube within the residual empyema. These results were called by telephone at the time of interpretation on 09/24/2015 at 4:13 pm to Dr. Nestor Lewandowsky , who verbally acknowledged these results. Electronically Signed   By: Jerilynn Mages.  Shick M.D.   On: 09/24/2015 16:16   Ct Image Guided Drainage By Percutaneous Catheter  09/23/2015  INDICATION: 50 year old with left lung pneumonia and probably empyema. Request for placement of a pigtail pleural drainage catheter. EXAM: CT-GUIDED PLACEMENT OF LEFT PLEURAL CATHETER MEDICATIONS: The patient is currently admitted to the hospital and receiving intravenous antibiotics. The antibiotics were administered within an appropriate time frame prior to the initiation of the procedure. ANESTHESIA/SEDATION: 3.0 mg IV Versed 75 mcg IV Fentanyl Moderate Sedation Time:  35 minutes The patient was continuously monitored during the procedure by the interventional radiology nurse under my direct supervision. COMPLICATIONS: None immediate. TECHNIQUE: Informed written consent was obtained from the patient's brother after a thorough discussion of the procedural risks, benefits and alternatives. All questions were addressed. Maximal Sterile Barrier Technique was utilized including mask, sterile gowns, sterile gloves, sterile drape, hand hygiene and skin antiseptic. A timeout was performed prior to the initiation of the procedure. PROCEDURE: Patient was placed prone on the CT scanner. Images through the chest were obtained. The left posterior chest was prepped with chlorhexidine and sterile field was created. Skin and soft tissues were anesthetized with 1% lidocaine. A 19 gauge Yueh catheter was directed into the pleural space while aspirating. Peach colored fluid was aspirated. A stiff Amplatz wire was advanced into the pleural space. The tract was dilated and attempted to advance a 12 [place] drain over the wire. This drain would not advance easily  and it was very uncomfortable for the patient. As a result, 10 [place] drain was advanced over the wire with some difficulty. Catheter placement was confirmed within the pleural space with CT imaging. Catheter was attached to a PleurEvac and additional peach colored cloud fluid was removed. Catheter was sutured to the skin and a dressing was placed. FINDINGS: Loculated left pleural fluid. Pleural catheter was placed in the largest posterior component. Cloudy peach colored fluid was removed. Findings are concerning for an empyema. IMPRESSION: Successful placement of a left pleural catheter with CT guidance. Cloudy fluid was removed and concerning for an empyema. Fluid was sent for culture and cytology. Electronically Signed   By: Markus Daft M.D.   On: 09/23/2015 17:33    Assessment:   Jarrah Cavanah is a 50 y.o. male with recent empyema, s/p decortication now with staph aureus infection of his  drain tube site but no evidence of pleural effusion or empyema recurrence.  Recommendations Continue vanco pending sensitivities  The staph species in blood is likely contaminant Can likely transition to oral therapy based on sensitivities - keflex, doxy or bactrim depending on sensis would be best Would give 3 week course of abx since drains will remain in place  Thank you very much for allowing me to participate in the care of this patient. Please call with questions.   Cheral Marker. Ola Spurr, MD

## 2015-10-22 NOTE — Progress Notes (Signed)
Inpatient Diabetes Program Recommendations  AACE/ADA: New Consensus Statement on Inpatient Glycemic Control (2015)  Target Ranges:  Prepandial:   less than 140 mg/dL      Peak postprandial:   less than 180 mg/dL (1-2 hours)      Critically ill patients:  140 - 180 mg/dL   Review of Glycemic Control  Results for [name], Sean [name] (MRN [id number]) as of 10/22/2015 08:02  Ref. Range 10/21/2015 11:30 10/21/2015 16:01 10/21/2015 21:03 10/21/2015 21:07 10/22/2015 07:47  Glucose-Capillary Latest Ref Range: 65-99 mg/dL 829235 (H) 562186 (H) 130411 (H) 406 (H) 138 (H)    Diabetes history: Type 2 Outpatient Diabetes medications: Lantus 36 units qhs, Metformin 1000mg  bid Current orders for Inpatient glycemic control: Novolog 0-9 units tid/hs  Inpatient Diabetes Program Recommendations:   Please consider adding Lantus insulin 10 units qhs (0.2units/kg), consider increasing Novolog correction to moderate correction scale 0-15 units tid and Novolog 0-5 units qhs  His post prandial blood sugars are high despite the current sensitive correction.  Fasting blood sugar is ideal because he received a large dose of Novolog at hs- adding Lantus will be a safer way to manage the blood sugars with less risk of hypoglycemia.  Susette RacerJulie Georgeanna Radziewicz, RN, BA, MHA, CDE Diabetes Coordinator Inpatient Diabetes Program  [phone] (Team Pager) [phone] [facility] Office) 10/22/2015 8:08 AM

## 2015-10-22 NOTE — Progress Notes (Signed)
Pharmacy Antibiotic Note  Sean [name] is a 50 y.o. male admitted on 10/20/2015 with sepsis.  Pharmacy has been consulted for vancomycin and meropenem dosing.  Plan: DW 57.2kg  Vd 40L kei 0.082 hr-1  T1/2 8 hours. Vancomycin 1 gram q 12 hours ordered with stacked dosing. Level before 5th dose. Goal trough 15-20.  Meropenem 1 gram q 8 hours ordered.  Height: 5\' 5"  (165.1 cm) Weight: 130 lb (58.968 kg) IBW/kg (Calculated) : 61.5  Temp (24hrs), Avg:98.6 F (37 C), Min:98.2 F (36.8 C), Max:99 F (37.2 C)   Recent Labs Lab 10/21/15 0016 10/21/15 0017 10/21/15 0335 10/22/15 0323  WBC 16.7*  --   --  10.8*  CREATININE 0.75  --   --  0.64  LATICACIDVEN  --  2.2* 1.8  --     Estimated Creatinine Clearance: 93.2 mL/min (by C-G formula based on Cr of 0.64).    Allergies  Allergen Reactions  . Penicillins Other (See Comments)    Reaction:  Unknown     Antimicrobials this admission: vancomycin  >>  merpenem  >>   Dose adjustments this admission:   Microbiology results: 5/3 BCx: pending 5/3  UCx: pending  5/4 wound cx pending 4/11 MRSA PCR: (-) 5/5 Biofire returned GPC x1 aerobic. Staph spp. MecA(+). Already on vancomycin. No new recommendation.  5/4 UA: (-) 5/4 CXR: no consolidation  Thank you for allowing pharmacy to be a part of this patient's care.  Angeles Zehner S 10/22/2015 6:56 AM

## 2015-10-22 NOTE — Progress Notes (Signed)
Looks well today.  Afebrile.  Wounds redressed.  Less erythema than yesterday.  Minimal drainage around tubes.  Afebrile and WBC down.  On Meripenam and Vancomycin.  Cultures growing MRSA.  Would continue antibiotics and local wound care.  Will follow.

## 2015-10-22 NOTE — Progress Notes (Signed)
BS 403 Prime doc notified Dr.Mody placing new orders.

## 2015-10-22 NOTE — Progress Notes (Signed)
Dressing change to chest tubes. Minimal yellow drainage noted on old dressing. Skin red with swelling. Chest tubes dry and intact.

## 2015-10-22 NOTE — Progress Notes (Signed)
Patient ID: Sean [name], male   DOB: Oct 23, 1965, 50 y.o.   MRN: [id number] [employer] [facility] PROGRESS NOTE  Sean [name] DOB: Oct 23, 1965 DOA: 10/20/2015 PCP: Emogene MorganAYCOCK, NGWE A, MD  HPI/Subjective: Patient feeling better denies any shortness of breath Limited review of systems due to his chronic mental status  Objective: Filed Vitals:   10/22/15 0359 10/22/15 0746  BP: 136/73 135/72  Pulse: 110 113  Temp: 98.2 F (36.8 C) 98.5 F (36.9 C)  Resp: 16     Filed Weights   10/21/15 0008  Weight: 58.968 kg (130 lb)    ROS: Review of Systems  Unable to perform ROS Patient is autistic Exam: Physical Exam  HENT:  Nose: No mucosal edema.  Mouth/Throat: No oropharyngeal exudate or posterior oropharyngeal edema.  Eyes: Conjunctivae, EOM and lids are normal. Pupils are equal, round, and reactive to light.  Neck: No JVD present. Carotid bruit is not present. No edema present. No thyroid mass and no thyromegaly present.  Cardiovascular: S1 normal and S2 normal.  Exam reveals no gallop.   No murmur heard. Pulses:      Dorsalis pedis pulses are 2+ on the right side, and 2+ on the left side.  Respiratory: No respiratory distress. He has no wheezes. He has no rhonchi. He has no rales.  GI: Soft. Bowel sounds are normal. There is no tenderness.  Musculoskeletal:       Right ankle: He exhibits no swelling.       Left ankle: He exhibits no swelling.  Lymphadenopathy:    He has no cervical adenopathy.  Neurological: He is alert.  Skin: Skin is warm. No rash noted. Nails show no clubbing.  Psychiatric: He has a normal mood and affect.      Data Reviewed: Basic Metabolic Panel:  Recent Labs Lab 10/21/15 0016 10/22/15 0323  NA 128* 133*  K 5.1 4.2  CL 93* 96*  CO2 26 27  GLUCOSE 254* 150*  BUN 25* 15  CREATININE 0.75 0.64  CALCIUM 9.0 8.8*   Liver Function Tests:  Recent Labs Lab 10/21/15 0016  AST 18  ALT 19  ALKPHOS 80  BILITOT 0.1*  PROT 8.1  ALBUMIN 2.8*    CBC:  Recent Labs Lab 10/21/15 0016 10/22/15 0323  WBC 16.7* 10.8*  NEUTROABS 14.5*  --   HGB 9.4* 10.4*  HCT 28.8* 31.2*  MCV 82.9 84.1  PLT 549* 504*    CBG:  Recent Labs Lab 10/21/15 1601 10/21/15 2103 10/21/15 2107 10/22/15 0747 10/22/15 1125  GLUCAP 186* 411* 406* 138* 264*    Recent Results (from the past 240 hour(s))  Blood Culture (routine x 2)     Status: None (Preliminary result)   Collection Time: 10/21/15 12:17 AM  Result Value Ref Range Status   Specimen Description BLOOD LEFT ANTECUBITAL  Final   Special Requests BOTTLES DRAWN AEROBIC AND ANAEROBIC 5ML  Final   Culture NO GROWTH 1 DAY  Final   Report Status PENDING  Incomplete  Blood Culture (routine x 2)     Status: Abnormal (Preliminary result)   Collection Time: 10/21/15 12:50 AM  Result Value Ref Range Status   Specimen Description BLOOD LEFT WRIST  Final   Special Requests BOTTLES DRAWN AEROBIC AND ANAEROBIC 5ML  Final   Culture  Setup Time   Final    GRAM POSITIVE COCCI AEROBIC BOTTLE ONLY CRITICAL RESULT CALLED TO, READ BACK BY AND VERIFIED WITHFulton Reek: Matt Mcbane @ [phone] 10/22/15 by [facility]    Culture  STAPHYLOCOCCUS SPECIES AEROBIC BOTTLE ONLY (A)  Final   Report Status PENDING  Incomplete  Blood Culture ID Panel (Reflexed)     Status: Abnormal   Collection Time: 10/21/15 12:50 AM  Result Value Ref Range Status   Enterococcus species NOT DETECTED NOT DETECTED Final   Vancomycin resistance NOT DETECTED NOT DETECTED Final   Listeria monocytogenes NOT DETECTED NOT DETECTED Final   Staphylococcus species DETECTED (A) NOT DETECTED Final    Comment: CRITICAL RESULT CALLED TO, READ BACK BY AND VERIFIED WITH: Matt Mcbane @ [phone] 10/22/15 by [facility]    Staphylococcus aureus NOT DETECTED NOT DETECTED Final   Methicillin resistance DETECTED (A) NOT DETECTED Final    Comment: CRITICAL RESULT CALLED TO, READ BACK BY AND VERIFIED WITH: Fulton Reek @ [phone] 10/22/15 by [facility]    Streptococcus species NOT DETECTED NOT  DETECTED Final   Streptococcus agalactiae NOT DETECTED NOT DETECTED Final   Streptococcus pneumoniae NOT DETECTED NOT DETECTED Final   Streptococcus pyogenes NOT DETECTED NOT DETECTED Final   Acinetobacter baumannii NOT DETECTED NOT DETECTED Final   Enterobacteriaceae species NOT DETECTED NOT DETECTED Final   Enterobacter cloacae complex NOT DETECTED NOT DETECTED Final   Escherichia coli NOT DETECTED NOT DETECTED Final   Klebsiella oxytoca NOT DETECTED NOT DETECTED Final   Klebsiella pneumoniae NOT DETECTED NOT DETECTED Final   Proteus species NOT DETECTED NOT DETECTED Final   Serratia marcescens NOT DETECTED NOT DETECTED Final   Carbapenem resistance NOT DETECTED NOT DETECTED Final   Haemophilus influenzae NOT DETECTED NOT DETECTED Final   Neisseria meningitidis NOT DETECTED NOT DETECTED Final   Pseudomonas aeruginosa NOT DETECTED NOT DETECTED Final   Candida albicans NOT DETECTED NOT DETECTED Final   Candida glabrata NOT DETECTED NOT DETECTED Final   Candida krusei NOT DETECTED NOT DETECTED Final   Candida parapsilosis NOT DETECTED NOT DETECTED Final   Candida tropicalis NOT DETECTED NOT DETECTED Final  Urine culture     Status: None   Collection Time: 10/21/15  2:40 AM  Result Value Ref Range Status   Specimen Description URINE, RANDOM  Final   Special Requests NONE  Final   Culture NO GROWTH 1 DAY  Final   Report Status 10/22/2015 FINAL  Final  Wound culture     Status: None (Preliminary result)   Collection Time: 10/21/15  1:51 PM  Result Value Ref Range Status   Specimen Description ABSCESS  Final   Special Requests Normal  Final   Gram Stain PENDING  Incomplete   Culture   Final    MODERATE GROWTH STAPHYLOCOCCUS AUREUS SUSCEPTIBILITIES TO FOLLOW    Report Status PENDING  Incomplete     Studies: Dg Chest 2 View  10/21/2015  CLINICAL DATA:  Febrile.  Chest pain. EXAM: CHEST  2 VIEW COMPARISON:  10/14/2015 FINDINGS: Three left chest tubes appear unchanged in position. No  significant pneumothorax. There may be a very small pleural air collection, manifested by relative hyperlucency adjacent to the aortic knob. No large effusion. Mild atelectatic appearing linear opacities in the left base. The right lung is clear. IMPRESSION: Three chest tubes appear unremarkable, with no significant pneumothorax. No confluent consolidation or large effusion. Electronically Signed   By: Ellery Plunk M.D.   On: 10/21/2015 00:37    Scheduled Meds: . atorvastatin  20 mg Oral QPM  . chlorproMAZINE  10 mg Oral Once  . enoxaparin (LOVENOX) injection  40 mg Subcutaneous Q24H  . insulin aspart  0-9 Units Subcutaneous TID AC &  HS  . lisinopril  10 mg Oral Daily  . meropenem (MERREM) IV  1 g Intravenous Q8H  . metoprolol tartrate  12.5 mg Oral BID  . sodium chloride  250 mL Intravenous Once  . sodium chloride flush  3 mL Intravenous Q12H  . vancomycin  1,000 mg Intravenous Q12H   Continuous Infusions:    Assessment/Plan:  1. Clinical sepsis,his is due to cellulitis around the chest tube site appreciate Dr. Inez Catalina input. Patient also has blood cx mrsa- CONTINUE VANCOMYCIN AND MEROPENEM FOR NOW. DR. Sampson Goon HAS SEEN THE PATIENT IN THE PAST i WILL ASK HIM TO COME BY AND SEE THE PATIENT FOR FURTHER INPUT. 2. Hiccups Resolved 3. Essential hypertension and tachycardia.I will discontinue his lisinopril and this increase his metoprolol due to persistent elevated heart ratel 4. Type 2 diabetes mellitus. Continue sliding scaleWill resume his metformin as taking at home 5. History of autism  I have discussed the case with patient's brother who is at bed side who is aware of his condition And plan a care Code Status:     Code Status Orders        Start     Ordered   10/21/15 0424  Full code   Continuous     10/21/15 0423    Code Status History    Date Active Date Inactive Code Status Order ID Comments User Context   09/24/2015  3:14 PM 10/09/2015  7:40 PM Full Code [id number]   Sylvan Cheese, RN Inpatient     Family Communication:  Family at bedside Disposition Plan: social worker looking into Medicaid beds at [facility]. If no beds then family may take home  Consultants:  Cardiothoracic surgery  Antibiotics:  Vancomycin   meropenem  Time spent: 35 minutes  Maliah Pyles, [facility]  [employer]

## 2015-10-22 NOTE — Progress Notes (Signed)
Pharmacy Antibiotic Note  Sean [name] is a 50 y.o. male admitted on 10/20/2015 with sepsis.  Pharmacy has been consulted for vancomycin and meropenem dosing.  Plan: DW 57.2kg  Vd 40L kei 0.082 hr-1  T1/2 8 hours. Vancomycin 1 gram q 12 hours ordered with stacked dosing. Level before 5th dose. Goal trough 15-20.  Meropenem 1 gram q 8 hours ordered.  5/5 :   Vanc trough @ 19:15 = 12 mcg/mL            Will increase dose to Vanc 1 gm IV Q8H to start 5/5 @ 21:00.            Will draw next Vanc trough before 3rd new dose on 5/6 @ 20:30.   Height: 5\' 5"  (165.1 cm) Weight: 130 lb (58.968 kg) IBW/kg (Calculated) : 61.5  Temp (24hrs), Avg:98.3 F (36.8 C), Min:97.5 F (36.4 C), Max:99 F (37.2 C)   Recent Labs Lab 10/21/15 0016 10/21/15 0017 10/21/15 0335 10/22/15 0323 10/22/15 1915  WBC 16.7*  --   --  10.8*  --   CREATININE 0.75  --   --  0.64  --   LATICACIDVEN  --  2.2* 1.8  --   --   VANCOTROUGH  --   --   --   --  12    Estimated Creatinine Clearance: 93.2 mL/min (by C-G formula based on Cr of 0.64).    Allergies  Allergen Reactions  . Penicillins Other (See Comments)    Reaction:  Unknown     Antimicrobials this admission: vancomycin  >>  merpenem  >>   Dose adjustments this admission:   Microbiology results: 5/3 BCx: pending 5/3  UCx: pending  5/4 wound cx pending 4/11 MRSA PCR: (-) 5/5 Biofire returned GPC x1 aerobic. Staph spp. MecA(+). Already on vancomycin. No new recommendation.  5/4 UA: (-) 5/4 CXR: no consolidation  Thank you for allowing pharmacy to be a part of this patient's care.  Abelina Ketron D 10/22/2015 8:05 PM

## 2015-10-23 LAB — GLUCOSE, CAPILLARY
GLUCOSE-CAPILLARY: 256 mg/dL — AB (ref 65–99)
Glucose-Capillary: 285 mg/dL — ABNORMAL HIGH (ref 65–99)
Glucose-Capillary: 320 mg/dL — ABNORMAL HIGH (ref 65–99)
Glucose-Capillary: 273 mg/dL — ABNORMAL HIGH (ref 65–99)

## 2015-10-23 LAB — VANCOMYCIN, TROUGH: VANCOMYCIN TR: 27 ug/mL — AB (ref 10–20)

## 2015-10-23 LAB — WOUND CULTURE: SPECIAL REQUESTS: NORMAL

## 2015-10-23 MED ORDER — INSULIN GLARGINE 100 UNIT/ML ~~LOC~~ SOLN
25.0000 [IU] | Freq: Every day | SUBCUTANEOUS | Status: DC
  Administered 2015-10-24: 25 [IU] via SUBCUTANEOUS
  Filled 2015-10-23 (×2): qty 0.25

## 2015-10-23 MED ORDER — VANCOMYCIN HCL IN DEXTROSE 1-5 GM/200ML-% IV SOLN
1000.0000 mg | Freq: Two times a day (BID) | INTRAVENOUS | Status: DC
  Filled 2015-10-23 (×2): qty 200

## 2015-10-23 NOTE — Progress Notes (Signed)
TC to S. Allena KatzPatel, MD regarding results of blood culture on 10/22/15, orders to be on contact precautions for MRSA in blood. Will continue to monitor.

## 2015-10-23 NOTE — Progress Notes (Signed)
Pharmacy Antibiotic Note  Sean [name] is a 50 y.o. male admitted on 10/20/2015 with sepsis.  Pharmacy has been consulted for vancomycin dosing.  Vancomycin trough of 27 mcg/mL is supratherapeutic on vancomycin 1 g IV q8h. Will decrease to 1 g IV q12h starting tomorrow morning. Goal vanc trough 15-20 mcg/mL Vancomycin trough ordered for 5/8  Plan:   Height: 5\' 5"  (165.1 cm) Weight: 130 lb (58.968 kg) IBW/kg (Calculated) : 61.5  Temp (24hrs), Avg:98.4 F (36.9 C), Min:97.6 F (36.4 C), Max:99.2 F (37.3 C)   Recent Labs Lab 10/21/15 0016 10/21/15 0017 10/21/15 0335 10/22/15 0323 10/22/15 1915 10/23/15 2038  WBC 16.7*  --   --  10.8*  --   --   CREATININE 0.75  --   --  0.64  --   --   LATICACIDVEN  --  2.2* 1.8  --   --   --   VANCOTROUGH  --   --   --   --  12 27*    Estimated Creatinine Clearance: 93.2 mL/min (by C-G formula based on Cr of 0.64).    Allergies  Allergen Reactions  . Penicillins Other (See Comments)    Reaction:  Unknown     Thank you for allowing pharmacy to be a part of this patient's care.  Cindi CarbonMary M Khila Papp, PharmD 10/23/2015 9:50 PM

## 2015-10-23 NOTE — Progress Notes (Signed)
Looks well today.  Afebrile.  Dressing intact, minimal erythema.  Minimal drainage around tubes.  Afebrile and WBC down.  On Meripenam and Vancomycin.  Cultures growing MRSA.  Would continue antibiotics and local wound care.  Will follow.

## 2015-10-23 NOTE — Progress Notes (Signed)
Patient ID: Sean [name], male   DOB: May 20, 1966, 50 y.o.   MRN: [id number] [facility] PROGRESS NOTE  Niki Daley NWG:[id number] DOB: Sep 10, 1965 DOA: 10/20/2015 PCP: Emogene Morgan, MD  HPI/Subjective: He wants to go home denies any complaints   Objective: Filed Vitals:   10/23/15 0336 10/23/15 0725  BP: 117/70 128/74  Pulse: 110 107  Temp: 98.6 F (37 C) 97.9 F (36.6 C)  Resp:  18    Filed Weights   10/21/15 0008  Weight: 58.968 kg (130 lb)    ROS: Review of Systems  Unable to perform ROS Patient is autistic Exam: Physical Exam  HENT:  Nose: No mucosal edema.  Mouth/Throat: No oropharyngeal exudate or posterior oropharyngeal edema.  Eyes: Conjunctivae, EOM and lids are normal. Pupils are equal, round, and reactive to light.  Neck: No JVD present. Carotid bruit is not present. No edema present. No thyroid mass and no thyromegaly present.  Cardiovascular: S1 normal and S2 normal.  Exam reveals no gallop.   No murmur heard. Pulses:      Dorsalis pedis pulses are 2+ on the right side, and 2+ on the left side.  Respiratory: No respiratory distress. He has no wheezes. He has no rhonchi. He has no rales.  GI: Soft. Bowel sounds are normal. There is no tenderness.  Musculoskeletal:       Right ankle: He exhibits no swelling.       Left ankle: He exhibits no swelling.  Lymphadenopathy:    He has no cervical adenopathy.  Neurological: He is alert.  Skin: Skin is warm. No rash noted. Nails show no clubbing.  Psychiatric: He has a normal mood and affect.      Data Reviewed: Basic Metabolic Panel:  Recent Labs Lab 10/21/15 0016 10/22/15 0323  NA 128* 133*  K 5.1 4.2  CL 93* 96*  CO2 26 27  GLUCOSE 254* 150*  BUN 25* 15  CREATININE 0.75 0.64  CALCIUM 9.0 8.8*   Liver Function Tests:  Recent Labs Lab 10/21/15 0016  AST 18  ALT 19  ALKPHOS 80  BILITOT 0.1*  PROT 8.1  ALBUMIN 2.8*   CBC:  Recent Labs Lab 10/21/15 0016 10/22/15 0323  WBC 16.7*  10.8*  NEUTROABS 14.5*  --   HGB 9.4* 10.4*  HCT 28.8* 31.2*  MCV 82.9 84.1  PLT 549* 504*    CBG:  Recent Labs Lab 10/22/15 1125 10/22/15 1615 10/22/15 2138 10/23/15 0730 10/23/15 1116  GLUCAP 264* 357* 403* 256* 285*    Recent Results (from the past 240 hour(s))  Blood Culture (routine x 2)     Status: None (Preliminary result)   Collection Time: 10/21/15 12:17 AM  Result Value Ref Range Status   Specimen Description BLOOD LEFT ANTECUBITAL  Final   Special Requests BOTTLES DRAWN AEROBIC AND ANAEROBIC  Final   Culture NO GROWTH 1 DAY  Final   Report Status PENDING  Incomplete  Blood Culture (routine x 2)     Status: None (Preliminary result)   Collection Time: 10/21/15 12:50 AM  Result Value Ref Range Status   Specimen Description BLOOD LEFT WRIST  Final   Special Requests BOTTLES DRAWN AEROBIC AND ANAEROBIC  Final   Culture  Setup Time   Final    GRAM POSITIVE COCCI AEROBIC BOTTLE ONLY CRITICAL RESULT CALLED TO, READ BACK BY AND VERIFIED WITHFulton Reek @ [phone] 10/22/15 by [facility]    Culture   Final    COAGULASE NEGATIVE STAPHYLOCOCCUS  Results consistent with contamination.    Report Status PENDING  Incomplete  Blood Culture ID Panel (Reflexed)     Status: Abnormal   Collection Time: 10/21/15 12:50 AM  Result Value Ref Range Status   Enterococcus species NOT DETECTED NOT DETECTED Final   Vancomycin resistance NOT DETECTED NOT DETECTED Final   Listeria monocytogenes NOT DETECTED NOT DETECTED Final   Staphylococcus species DETECTED (A) NOT DETECTED Final    Comment: CRITICAL RESULT CALLED TO, READ BACK BY AND VERIFIED WITH: Matt Mcbane @ [phone] 10/22/15 by [facility]    Staphylococcus aureus NOT DETECTED NOT DETECTED Final   Methicillin resistance DETECTED (A) NOT DETECTED Final    Comment: CRITICAL RESULT CALLED TO, READ BACK BY AND VERIFIED WITH: Matt Mcbane @ [phone] 10/22/15 by [facility]    Streptococcus species NOT DETECTED NOT DETECTED Final   Streptococcus agalactiae  NOT DETECTED NOT DETECTED Final   Streptococcus pneumoniae NOT DETECTED NOT DETECTED Final   Streptococcus pyogenes NOT DETECTED NOT DETECTED Final   Acinetobacter baumannii NOT DETECTED NOT DETECTED Final   Enterobacteriaceae species NOT DETECTED NOT DETECTED Final   Enterobacter cloacae complex NOT DETECTED NOT DETECTED Final   Escherichia coli NOT DETECTED NOT DETECTED Final   Klebsiella oxytoca NOT DETECTED NOT DETECTED Final   Klebsiella pneumoniae NOT DETECTED NOT DETECTED Final   Proteus species NOT DETECTED NOT DETECTED Final   Serratia marcescens NOT DETECTED NOT DETECTED Final   Carbapenem resistance NOT DETECTED NOT DETECTED Final   Haemophilus influenzae NOT DETECTED NOT DETECTED Final   Neisseria meningitidis NOT DETECTED NOT DETECTED Final   Pseudomonas aeruginosa NOT DETECTED NOT DETECTED Final   Candida albicans NOT DETECTED NOT DETECTED Final   Candida glabrata NOT DETECTED NOT DETECTED Final   Candida krusei NOT DETECTED NOT DETECTED Final   Candida parapsilosis NOT DETECTED NOT DETECTED Final   Candida tropicalis NOT DETECTED NOT DETECTED Final  Urine culture     Status: None   Collection Time: 10/21/15  2:40 AM  Result Value Ref Range Status   Specimen Description URINE, RANDOM  Final   Special Requests NONE  Final   Culture NO GROWTH 1 DAY  Final   Report Status 10/22/2015 FINAL  Final  Wound culture     Status: None   Collection Time: 10/21/15  1:51 PM  Result Value Ref Range Status   Specimen Description ABSCESS  Final   Special Requests Normal  Final   Gram Stain RARE WBC SEEN FEW GRAM POSITIVE COCCI   Final   Culture   Final    MODERATE GROWTH METHICILLIN RESISTANT STAPHYLOCOCCUS AUREUS   Report Status 10/23/2015 FINAL  Final   Organism ID, Bacteria METHICILLIN RESISTANT STAPHYLOCOCCUS AUREUS  Final      Susceptibility   Methicillin resistant staphylococcus aureus - MIC*    CIPROFLOXACIN >=8 RESISTANT Resistant     ERYTHROMYCIN <=0.25 SENSITIVE  Sensitive     GENTAMICIN 8 INTERMEDIATE Intermediate     OXACILLIN >=4 RESISTANT Resistant     TETRACYCLINE <=1 SENSITIVE Sensitive     VANCOMYCIN 1 SENSITIVE Sensitive     TRIMETH/SULFA <=10 SENSITIVE Sensitive     CLINDAMYCIN <=0.25 SENSITIVE Sensitive     RIFAMPIN <=0.5 SENSITIVE Sensitive     Inducible Clindamycin NEGATIVE Sensitive     * MODERATE GROWTH METHICILLIN RESISTANT STAPHYLOCOCCUS AUREUS     Studies: No results found.  Scheduled Meds: . atorvastatin  20 mg Oral QPM  . enoxaparin (LOVENOX) injection  40 mg Subcutaneous Q24H  .  insulin aspart  0-9 Units Subcutaneous TID AC & HS  . [START ON 10/24/2015] insulin glargine  25 Units Subcutaneous Daily  . metoprolol tartrate  25 mg Oral BID  . sodium chloride  250 mL Intravenous Once  . sodium chloride flush  3 mL Intravenous Q12H  . vancomycin  1,000 mg Intravenous Q8H   Continuous Infusions:    Assessment/Plan:  1. Clinical sepsis,This is related to cellulitis with MRSA around the catheter tip. His blood cultures are growing coag negative staph consistent with contamination appreciate infectious disease input. Once I have the ID of the bacteria then we can change him to oral antibiotics. 2. Recent empyema with chest tube continue chest tube care 3. Hiccups Resolved 4. Essential hypertension and tachycardia.continue higher dose of metoprolol  5. Type 2 diabetes mellitus. Continue sliding scaleWill resume his metformin as taking at home 6. History of autism   Code Status:     Code Status Orders        Start     Ordered   10/21/15 0424  Full code   Continuous     10/21/15 0423    Code Status History    Date Active Date Inactive Code Status Order ID Comments User Context   09/24/2015  3:14 PM 10/09/2015  7:40 PM Full Code [id number]  Sylvan CheeseMyrna L Borun, RN Inpatient     Family Communication:  I will discuss with his brother Disposition Plan[place] [facility] [place] [facility] If no beds then  family may take home  Consultants:  Cardiothoracic surgery Infectious disease  Antibiotics:  Vancomycin    Time spent: 32 minutes  Chesley Veasey, [facility]REYANG  [employer]

## 2015-10-24 LAB — CBC
HEMATOCRIT: 29.7 % — AB (ref 40.0–52.0)
Hemoglobin: 9.8 g/dL — ABNORMAL LOW (ref 13.0–18.0)
MCH: 27.1 pg (ref 26.0–34.0)
MCHC: 32.9 g/dL (ref 32.0–36.0)
MCV: 82.3 fL (ref 80.0–100.0)
PLATELETS: 542 10*3/uL — AB (ref 150–440)
RBC: 3.61 MIL/uL — AB (ref 4.40–5.90)
RDW: 15.7 % — ABNORMAL HIGH (ref 11.5–14.5)
WBC: 7.5 10*3/uL (ref 3.8–10.6)

## 2015-10-24 LAB — BASIC METABOLIC PANEL
Anion gap: 10 (ref 5–15)
BUN: 20 mg/dL (ref 6–20)
CALCIUM: 8.8 mg/dL — AB (ref 8.9–10.3)
CHLORIDE: 96 mmol/L — AB (ref 101–111)
CO2: 27 mmol/L (ref 22–32)
Creatinine, Ser: 0.62 mg/dL (ref 0.61–1.24)
GFR calc Af Amer: >60 mL/min (ref >60)
GLUCOSE: 194 mg/dL — AB (ref 65–99)
POTASSIUM: 4.3 mmol/L (ref 3.5–5.1)
SODIUM: 133 mmol/L — AB (ref 135–145)

## 2015-10-24 LAB — GLUCOSE, CAPILLARY
GLUCOSE-CAPILLARY: 148 mg/dL — AB (ref 65–99)
GLUCOSE-CAPILLARY: 177 mg/dL — AB (ref 65–99)
Glucose-Capillary: 294 mg/dL — ABNORMAL HIGH (ref 65–99)
Glucose-Capillary: 183 mg/dL — ABNORMAL HIGH (ref 65–99)

## 2015-10-24 MED ORDER — METFORMIN HCL 500 MG PO TABS
1000.0000 mg | ORAL_TABLET | Freq: Two times a day (BID) | ORAL | Status: DC
  Administered 2015-10-24 – 2015-10-25 (×2): 1000 mg via ORAL
  Filled 2015-10-24 (×2): qty 2

## 2015-10-24 MED ORDER — DOXYCYCLINE HYCLATE 100 MG PO TABS
100.0000 mg | ORAL_TABLET | Freq: Two times a day (BID) | ORAL | Status: DC
  Administered 2015-10-24 – 2015-10-25 (×3): 100 mg via ORAL
  Filled 2015-10-24 (×3): qty 1

## 2015-10-24 NOTE — Progress Notes (Signed)
Patient ID: Sean [name], male   DOB: Aug 15, 1965, 50 y.o.   MRN: [id number] [employer] [facility] PROGRESS NOTE  Matt Sarita Haver WUJ:[id number] DOB: [birth year] DOA: 10/20/2015 PCP: Emogene Morgan, MD  HPI/Subjective: He continues to pull his IVs out. Denies any complaints  Objective: Filed Vitals:   10/24/15 0511 10/24/15 0800  BP: 128/70 123/75  Pulse: 98 88  Temp: 98.4 F (36.9 C) 97.9 F (36.6 C)  Resp: 18 20    Filed Weights   10/21/15 0008  Weight: 58.968 kg (130 lb)    ROS: Review of Systems  Unable to perform ROS Patient is autistic Exam: Physical Exam  HENT:  Nose: No mucosal edema.  Mouth/Throat: No oropharyngeal exudate or posterior oropharyngeal edema.  Eyes: Conjunctivae, EOM and lids are normal. Pupils are equal, round, and reactive to light.  Neck: No JVD present. Carotid bruit is not present. No edema present. No thyroid mass and no thyromegaly present.  Cardiovascular: S1 normal and S2 normal.  Exam reveals no gallop.   No murmur heard. Pulses:      Dorsalis pedis pulses are 2+ on the right side, and 2+ on the left side.  Respiratory: No respiratory distress. He has no wheezes. He has no rhonchi. He has no rales.  GI: Soft. Bowel sounds are normal. There is no tenderness.  Musculoskeletal:       Right ankle: He exhibits no swelling.       Left ankle: He exhibits no swelling.  Lymphadenopathy:    He has no cervical adenopathy.  Neurological: He is alert.  Skin: Skin is warm. No rash noted. Nails show no clubbing.  Psychiatric: He has a normal mood and affect.      Data Reviewed: Basic Metabolic Panel:  Recent Labs Lab 10/21/15 0016 10/22/15 0323 10/24/15 0337  NA 128* 133* 133*  K 5.1 4.2 4.3  CL 93* 96* 96*  CO2 26 27 27   GLUCOSE 254* 150* 194*  BUN 25* 15 20  CREATININE 0.75 0.64 0.62  CALCIUM 9.0 8.8* 8.8*   Liver Function Tests:  Recent Labs Lab 10/21/15 0016  AST 18  ALT 19  ALKPHOS 80  BILITOT 0.1*  PROT 8.1  ALBUMIN 2.8*    CBC:  Recent Labs Lab 10/21/15 0016 10/22/15 0323 10/24/15 0337  WBC 16.7* 10.8* 7.5  NEUTROABS 14.5*  --   --   HGB 9.4* 10.4* 9.8*  HCT 28.8* 31.2* 29.7*  MCV 82.9 84.1 82.3  PLT 549* 504* 542*    CBG:  Recent Labs Lab 10/23/15 0730 10/23/15 1116 10/23/15 1530 10/23/15 2137 10/24/15 0733  GLUCAP 256* 285* 320* 273* 148*    Recent Results (from the past 240 hour(s))  Blood Culture (routine x 2)     Status: None (Preliminary result)   Collection Time: 10/21/15 12:17 AM  Result Value Ref Range Status   Specimen Description BLOOD LEFT ANTECUBITAL  Final   Special Requests BOTTLES DRAWN AEROBIC AND ANAEROBIC  Final   Culture NO GROWTH 2 DAYS  Final   Report Status PENDING  Incomplete  Blood Culture (routine x 2)     Status: None (Preliminary result)   Collection Time: 10/21/15 12:50 AM  Result Value Ref Range Status   Specimen Description BLOOD LEFT WRIST  Final   Special Requests BOTTLES DRAWN AEROBIC AND ANAEROBIC  Final   Culture  Setup Time   Final    GRAM POSITIVE COCCI AEROBIC BOTTLE ONLY CRITICAL RESULT CALLED TO, READ BACK BY AND VERIFIED  WITHFulton Reek @ [phone] 10/22/15 by [facility]    Culture   Final    COAGULASE NEGATIVE STAPHYLOCOCCUS Results consistent with contamination.    Report Status PENDING  Incomplete  Blood Culture ID Panel (Reflexed)     Status: Abnormal   Collection Time: 10/21/15 12:50 AM  Result Value Ref Range Status   Enterococcus species NOT DETECTED NOT DETECTED Final   Vancomycin resistance NOT DETECTED NOT DETECTED Final   Listeria monocytogenes NOT DETECTED NOT DETECTED Final   Staphylococcus species DETECTED (A) NOT DETECTED Final    Comment: CRITICAL RESULT CALLED TO, READ BACK BY AND VERIFIED WITH: Matt Mcbane @ [phone] 10/22/15 by [facility]    Staphylococcus aureus NOT DETECTED NOT DETECTED Final   Methicillin resistance DETECTED (A) NOT DETECTED Final    Comment: CRITICAL RESULT CALLED TO, READ BACK BY AND VERIFIED WITH: Matt  Mcbane @ [phone] 10/22/15 by [facility]    Streptococcus species NOT DETECTED NOT DETECTED Final   Streptococcus agalactiae NOT DETECTED NOT DETECTED Final   Streptococcus pneumoniae NOT DETECTED NOT DETECTED Final   Streptococcus pyogenes NOT DETECTED NOT DETECTED Final   Acinetobacter baumannii NOT DETECTED NOT DETECTED Final   Enterobacteriaceae species NOT DETECTED NOT DETECTED Final   Enterobacter cloacae complex NOT DETECTED NOT DETECTED Final   Escherichia coli NOT DETECTED NOT DETECTED Final   Klebsiella oxytoca NOT DETECTED NOT DETECTED Final   Klebsiella pneumoniae NOT DETECTED NOT DETECTED Final   Proteus species NOT DETECTED NOT DETECTED Final   Serratia marcescens NOT DETECTED NOT DETECTED Final   Carbapenem resistance NOT DETECTED NOT DETECTED Final   Haemophilus influenzae NOT DETECTED NOT DETECTED Final   Neisseria meningitidis NOT DETECTED NOT DETECTED Final   Pseudomonas aeruginosa NOT DETECTED NOT DETECTED Final   Candida albicans NOT DETECTED NOT DETECTED Final   Candida glabrata NOT DETECTED NOT DETECTED Final   Candida krusei NOT DETECTED NOT DETECTED Final   Candida parapsilosis NOT DETECTED NOT DETECTED Final   Candida tropicalis NOT DETECTED NOT DETECTED Final  Urine culture     Status: None   Collection Time: 10/21/15  2:40 AM  Result Value Ref Range Status   Specimen Description URINE, RANDOM  Final   Special Requests NONE  Final   Culture NO GROWTH 1 DAY  Final   Report Status 10/22/2015 FINAL  Final  Wound culture     Status: None   Collection Time: 10/21/15  1:51 PM  Result Value Ref Range Status   Specimen Description ABSCESS  Final   Special Requests Normal  Final   Gram Stain RARE WBC SEEN FEW GRAM POSITIVE COCCI   Final   Culture   Final    MODERATE GROWTH METHICILLIN RESISTANT STAPHYLOCOCCUS AUREUS   Report Status 10/23/2015 FINAL  Final   Organism ID, Bacteria METHICILLIN RESISTANT STAPHYLOCOCCUS AUREUS  Final      Susceptibility   Methicillin  resistant staphylococcus aureus - MIC*    CIPROFLOXACIN >=8 RESISTANT Resistant     ERYTHROMYCIN <=0.25 SENSITIVE Sensitive     GENTAMICIN 8 INTERMEDIATE Intermediate     OXACILLIN >=4 RESISTANT Resistant     TETRACYCLINE <=1 SENSITIVE Sensitive     VANCOMYCIN 1 SENSITIVE Sensitive     TRIMETH/SULFA <=10 SENSITIVE Sensitive     CLINDAMYCIN <=0.25 SENSITIVE Sensitive     RIFAMPIN <=0.5 SENSITIVE Sensitive     Inducible Clindamycin NEGATIVE Sensitive     * MODERATE GROWTH METHICILLIN RESISTANT STAPHYLOCOCCUS AUREUS     Studies: No results found.  Scheduled Meds: . atorvastatin  20 mg Oral QPM  . doxycycline  100 mg Oral Q12H  . enoxaparin (LOVENOX) injection  40 mg Subcutaneous Q24H  . insulin aspart  0-9 Units Subcutaneous TID AC & HS  . insulin glargine  25 Units Subcutaneous Daily  . metoprolol tartrate  25 mg Oral BID  . sodium chloride  250 mL Intravenous Once  . sodium chloride flush  3 mL Intravenous Q12H   Continuous Infusions:    Assessment/Plan:  1. Clinical sepsis,This is related to cellulitis with MRSA around the catheter tip. His blood cultures are growing coag negative staph consistent with contamination  Since the sensitivities are back as per ID direction I will switch patient to doxycycline. Repeat CBC in the morning. We'll likely be able to discharge home.  2. Recent empyema with chest tube continue chest tube care no evidence of recurrent empyema per CT surgery 3. Hiccups Resolved 4. Essential hypertension and tachycardia.continue metoprolol heart rate stable 5. Type 2 diabetes mellitus. Patient currently on Lantus blood sugars labile restart metformin today 6. History of autism   Code Status:     Code Status Orders        Start     Ordered   10/21/15 0424  Full code   Continuous     10/21/15 0423    Code Status History    Date Active Date Inactive Code Status Order ID Comments User Context   09/24/2015  3:14 PM 10/09/2015  7:40 PM Full Code  [id number]  Sylvan CheeseMyrna L Borun, RN Inpatient     Family Communication:  I will discuss with his brother Disposition Plan[facility] If no beds then family may take home  Consultants:  Cardiothoracic surgery Infectious disease  Antibiotics:  Vancomycin    Time spent: 25 minutes  Raynard Mapps, [facility]  [employer]

## 2015-10-24 NOTE — Progress Notes (Signed)
CC: No current complaints Subjective: Patient with cognitive impairment admitted with clinical sepsis. History of left-sided empyema with empyema drain still in place. He has no complaints to me this morning. Difficult to communicate.  Objective: Vital signs in last 24 hours: Temp:  [97.9 F (36.6 C)-99.2 F (37.3 C)] 97.9 F (36.6 C) (05/07 0800) Pulse Rate:  [88-111] 88 (05/07 0800) Resp:  [18-20] 20 (05/07 0800) BP: (122-128)/(62-76) 123/75 mmHg (05/07 0800) SpO2:  [97 %-100 %] 97 % (05/07 0511) Last BM Date: 10/23/15  Intake/Output from previous day: 05/06 0701 - 05/07 0700 In: 36 [I.V.:36] Out: 0  Intake/Output this shift:    Physical exam:  Gen.: No acute distress Chest: Clear to auscultation, empyema drain and placed the left chest with minimal hyperemia. Serous drainage on the bandage. Well-healed thoracotomy incision site. Heart: Regular rate and rhythm Abdomen: Soft and nontender  Lab Results: CBC   Recent Labs  10/22/15 0323 10/24/15 0337  WBC 10.8* 7.5  HGB 10.4* 9.8*  HCT 31.2* 29.7*  PLT 504* 542*   BMET  Recent Labs  10/22/15 0323 10/24/15 0337  NA 133* 133*  K 4.2 4.3  CL 96* 96*  CO2 27 27  GLUCOSE 150* 194*  BUN 15 20  CREATININE 0.64 0.62  CALCIUM 8.8* 8.8*   PT/INR No results for input(s): LABPROT, INR in the last 72 hours. ABG No results for input(s): PHART, HCO3 in the last 72 hours.  Invalid input(s): PCO2, PO2  Studies/Results: No results found.  Anti-infectives: Anti-infectives    Start     Dose/Rate Route Frequency Ordered Stop   10/24/15 1000  doxycycline (VIBRA-TABS) tablet 100 mg     100 mg Oral Every 12 hours 10/24/15 0859     10/24/15 0800  vancomycin (VANCOCIN) IVPB 1000 mg/200 mL premix  Status:  Discontinued     1,000 mg 200 mL/hr over 60 Minutes Intravenous Every 12 hours 10/23/15 2148 10/24/15 0859   10/22/15 2100  vancomycin (VANCOCIN) IVPB 1000 mg/200 mL premix  Status:  Discontinued     1,000 mg 200  mL/hr over 60 Minutes Intravenous Every 8 hours 10/22/15 2003 10/23/15 2115   10/21/15 0800  vancomycin (VANCOCIN) IVPB 1000 mg/200 mL premix  Status:  Discontinued     1,000 mg 200 mL/hr over 60 Minutes Intravenous Every 12 hours 10/21/15 0505 10/22/15 2003   10/21/15 0600  meropenem (MERREM) 1 g in sodium chloride 0.9 % 100 mL IVPB  Status:  Discontinued     1 g 200 mL/hr over 30 Minutes Intravenous Every 8 hours 10/21/15 0459 10/22/15 1548   10/21/15 0015  vancomycin (VANCOCIN) IVPB 1000 mg/200 mL premix     1,000 mg 200 mL/hr over 60 Minutes Intravenous  Once 10/21/15 0002 10/21/15 0202      Assessment/Plan:  50 year old male admitted with clinical sepsis with a history of recent empyema treated with thoracotomy and decortication. Empyema drain remains in place. Agree with current antibiotic management per the primary team. Patient was seen by Dr. Thelma Bargeaks tomorrow for continued guidance on empyema tube. This will continue to be a long process. Likely okay for discharge to facility or home the next 24-48 hours.  Nadean Montanaro T. Tonita CongWoodham, MD, FACS  10/24/2015

## 2015-10-25 ENCOUNTER — Inpatient Hospital Stay: Payer: Medicaid Other

## 2015-10-25 LAB — GLUCOSE, CAPILLARY
GLUCOSE-CAPILLARY: 323 mg/dL — AB (ref 65–99)
Glucose-Capillary: 192 mg/dL — ABNORMAL HIGH (ref 65–99)

## 2015-10-25 MED ORDER — OXYCODONE-ACETAMINOPHEN 5-325 MG PO TABS: 1.0000 | ORAL_TABLET | Freq: Four times a day (QID) | ORAL | Status: AC | PRN

## 2015-10-25 MED ORDER — INSULIN GLARGINE 100 UNIT/ML ~~LOC~~ SOLN
30.0000 [IU] | Freq: Every day | SUBCUTANEOUS | Status: DC
  Administered 2015-10-25: 30 [IU] via SUBCUTANEOUS
  Filled 2015-10-25: qty 0.3

## 2015-10-25 MED ORDER — DOXYCYCLINE HYCLATE 100 MG PO TABS: 100.0000 mg | ORAL_TABLET | Freq: Two times a day (BID) | ORAL | Status: AC

## 2015-10-25 MED ORDER — INSULIN GLARGINE 100 UNIT/ML ~~LOC~~ SOLN: 35.0000 [IU] | Freq: Every day | SUBCUTANEOUS | Status: DC

## 2015-10-25 NOTE — Care Management (Addendum)
Spoke with patient's brother Burna SisSaif [phone]; discharge plan discussed with Saif/Dr. Allena KatzPatel. Saif needs patient to discharge home by EMS before 1PM. EMS packet completed. Jason with [facility] notified that patient needs to be seen tomorrow after 1PM. No further RNCM needs.

## 2015-10-25 NOTE — Care Management (Signed)
[name] has arrived and received antibiotic and pain meds/RX in his hands to take to pharmacy himself. He has declined EMS transport and wishes to take by private vehicle. I have called EMS and cancelled transport.

## 2015-10-25 NOTE — Discharge Summary (Signed)
Sean [name], 50 y.o., DOB 1965/10/25, MRN [id number]. Admission date: 10/20/2015 Discharge Date 10/25/2015 Primary MD Emogene MorganAYCOCK, NGWE A, MD Admitting Physician Oralia Manisavid Willis, MD  Admission Diagnosis  Sepsis, due to unspecified organism [facility](HCC) [A41.9]  Discharge Diagnosis   Principal Problem:   Sepsis (HCC) related to MRSA cellulitis at the site of the chest tube    Empyema [facility](HCC)   Intellectual disability   HTN (hypertension)   HLD (hyperlipidemia)   Type 2 diabetes mellitus [facility](HCC)          Hospital Course patient is a 50 year old [place] male who was recently hospitalized with empyema and had a chest tube placed. Patient had been followed up as outpatient for chest care with Dr. Inez Catalinaakes. Patient was brought back to the emergency room with fever and left-sided chest wall pain and some pus draining from around the chest tube site. He was admitted for further evaluation and treatment. He was evaluated by Dr. Thelma Bargeaks who felt that the infection was related to external cellulitis around the chest tube site. Cultures grew out MRSA. He was also seen in consultation by Dr. Sampson GoonFitzgerald infectious disease. Who recommended doxycycline for 3 weeks and outpatient follow-up. With him. Patient will continue to have care of his chest tube. He will also have home health nurse arrange for further therapy.            Consults   Dr. Thelma Bargeaks, and ID Significant Tests:  See full reports for all details      Dg Chest 1 View  10/01/2015  CLINICAL DATA:  Evaluate post thoracotomy EXAM: CHEST 1 VIEW COMPARISON:  09/29/2015 FINDINGS: Left chest tubes remain in place. No pneumothorax. Very low lung volumes with right basilar atelectasis and patchy opacities throughout the left lung, stable since prior study. Heart is borderline in size. IMPRESSION: Very low lung volumes with diffuse left lung and right basilar airspace opacities, likely atelectasis. Left chest tubes without pneumothorax. Note real change since prior study.  Electronically Signed   By: Charlett NoseKevin  Dover M.D.   On: 10/01/2015 08:55   X-ray Chest Pa Or Ap  09/28/2015  CLINICAL DATA:  Status post bronchoscopy and thoracotomy EXAM: CHEST 1 VIEW COMPARISON:  09/27/2015 FINDINGS: Cardiac shadow is stable. Left-sided chest tubes are now seen. Lateral left pneumothorax is noted inferiorly. Some left basilar atelectasis is seen. A surgical drain is noted in the lateral chest wall. The right lung remains clear. The bony structures are within normal limits. IMPRESSION: Status post left thoracotomy with inferior lateral pneumothorax noted. Left basilar atelectasis is seen as well. Electronically Signed   By: Alcide CleverMark  Lukens M.D.   On: 09/28/2015 18:23   Dg Chest 1 View  09/27/2015  CLINICAL DATA:  Pleural effusion. EXAM: CHEST 1 VIEW COMPARISON:  09/26/2015. FINDINGS: Left chest drainage catheter in stable position. Mediastinum and hilar structures are normal. Persistent left lower lobe infiltrate left pleural effusion again noted. Persistent pleural-based density noted over the left upper chest most likely loculated pleural effusion. No interim change. Low lung volumes. No pneumothorax. IMPRESSION: 1. Left chest drainage catheter in stable position. Persistent small left pleural effusion. Persistent pleural-based density left upper chest consistent with small loculated effusion. 2.  Persistent left lower lobe infiltrate. Electronically Signed   By: Maisie Fushomas  Register   On: 09/27/2015 07:25   Dg Chest 1 View  09/26/2015  CLINICAL DATA:  50 year old with cavitary left lower lobe pneumonia and left parapneumonic effusion/empyema of with indwelling drainage catheter. EXAM: Portable CHEST 1 VIEW COMPARISON:  09/24/2015 and earlier. FINDINGS: Interval repositioning of the pleural drainage catheter so stenosis now at the base of the left hemithorax. Persistent loculated left pleural effusion in the upper lateral hemithorax, axillary region, unchanged. Dense left lower lobe consolidation,  unchanged. Suboptimal inspiration with atelectasis in the right lower lobe, unchanged. No new pulmonary parenchymal abnormalities. Cardiac silhouette upper normal in size for technique and degree of inspiration. IMPRESSION: 1. Stable dense left lower lobe pneumonia. 2. Stable loculated effusion in the upper lateral left hemithorax. 3. Stable right lower lobe atelectasis related to suboptimal inspiration. 4. No new abnormalities. Electronically Signed   By: Hulan Saas M.D.   On: 09/26/2015 08:05   Dg Chest 2 View  10/25/2015  CLINICAL DATA:  Postop EXAM: CHEST  2 VIEW COMPARISON:  10/21/2015 FINDINGS: Cardiomediastinal silhouette is stable. Three left side chest tubes are unchanged in position. Mild atelectasis left lung base again noted. No significant pneumothorax. No pulmonary edema. IMPRESSION: Stable left chest tubes position without significant pneumothorax. Mild left basilar atelectasis. No pulmonary edema. Electronically Signed   By: Natasha Mead M.D.   On: 10/25/2015 08:31   Dg Chest 2 View  10/21/2015  CLINICAL DATA:  Febrile.  Chest pain. EXAM: CHEST  2 VIEW COMPARISON:  10/14/2015 FINDINGS: Three left chest tubes appear unchanged in position. No significant pneumothorax. There may be a very small pleural air collection, manifested by relative hyperlucency adjacent to the aortic knob. No large effusion. Mild atelectatic appearing linear opacities in the left base. The right lung is clear. IMPRESSION: Three chest tubes appear unremarkable, with no significant pneumothorax. No confluent consolidation or large effusion. Electronically Signed   By: Ellery Plunk M.D.   On: 10/21/2015 00:37   Dg Chest 2 View  10/14/2015  CLINICAL DATA:  Status post thoracoscopy and left thoracotomy and decortication on April 17th ; possible malpositioning of chest tubes. EXAM: CHEST  2 VIEW COMPARISON:  Chest x-ray of October 08, 2015 FINDINGS: There remain 3 chest tubes on the left. Positioning appears stable. The  upper chest tube tip projects over the posterior medial aspect of the third rib. The middle chest tube tip projects over the medial interspace between the eighth and ninth ribs. The lower chest tube tip projects in the lateral interspace between the tenth and eleventh ribs. There is a trace of pleural fluid present. No pneumothorax is observed. The right lung is clear. There is no shift of the mediastinum. The bony structures are unremarkable. IMPRESSION: Stable positioning of the 3 left-sided chest tubes. No pneumothorax. Only a small amount of pleural fluid remains. Electronically Signed   By: David  [place]and M.D.   On: 10/14/2015 08:41   Dg Chest 2 View  10/08/2015  CLINICAL DATA:  Postop common left chest tubes EXAM: CHEST  2 VIEW COMPARISON:  10/05/2015 FINDINGS: Left chest tubes remain in place. No pneumothorax. Left base atelectasis. Right lung is clear. Heart is normal size. IMPRESSION: Left chest tubes remain in place without visible pneumothorax. Left base atelectasis. Electronically Signed   By: Charlett Nose M.D.   On: 10/08/2015 10:50   Dg Chest 2 View  10/05/2015  CLINICAL DATA:  Empyema. EXAM: CHEST  2 VIEW COMPARISON:  10/02/2015 FINDINGS: AP and lateral views of the chest show persistence of the 3 left thoracotomy tubes, stable in position. Soft tissue drain in the left lateral chest wall remains in place. No evidence for pneumothorax. There is left base collapse/consolidation, but this is improved since prior study. Right lung remains  clear. Cardiopericardial silhouette is at upper limits of normal for size. IMPRESSION: Stable position of left-sided chest tubes with improving aeration at the left lung base. No evidence for pneumothorax. Electronically Signed   By: Kennith Center M.D.   On: 10/05/2015 14:16   Dg Chest 2 View  10/02/2015  CLINICAL DATA:  F/u post op day 4 left thoracotomy, left chest tube EXAM: CHEST  2 VIEW COMPARISON:  10/01/2015 FINDINGS: Motion and position degraded lateral  view. 3 left-sided chest tubes are similar. Midline trachea. Normal heart size. Small volume left-sided pleural fluid, improved. No pneumothorax. Improved bibasilar airspace disease, worse on the left. IMPRESSION: Left-sided chest tubes remaining in place with decreased pleural fluid and no pneumothorax. Improved bibasilar Airspace disease, likely atelectasis. Electronically Signed   By: Jeronimo Greaves M.D.   On: 10/02/2015 16:04   Dg Chest Port 1 View  09/29/2015  CLINICAL DATA:  Postop.  Recent left lower lobe pneumonia. EXAM: PORTABLE CHEST 1 VIEW COMPARISON:  09/28/2015; 09/27/2015; 09/21/2015; chest CT -09/21/2015 FINDINGS: Grossly unchanged cardiac silhouette and mediastinal contours. Stable positioning of support apparatus. No pneumothorax. Small loculated left-sided effusion with associated left mid and lower lung heterogeneous/consolidative opacities are unchanged. No new focal airspace opacities. Unchanged bones. IMPRESSION: 1.  Stable positioning of support apparatus.  No pneumothorax. 2. Unchanged small loculated left-sided effusion with associated left mid and lower lung heterogeneous/consolidative opacities, atelectasis versus infiltrate. Electronically Signed   By: Simonne Come M.D.   On: 09/29/2015 09:03       Today   Subjective:   Sean [name]  Feels ok denies any complaints  Objective:   Blood pressure 108/66, pulse 88, temperature 97.6 F (36.4 C), temperature source Oral, resp. rate 16, height 5\' 5"  (1.651 m), weight 58.968 kg (130 lb), SpO2 100 %.  .  Intake/Output Summary (Last 24 hours) at 10/25/15 1342 Last data filed at 10/25/15 0900  Gross per 24 hour  Intake    603 ml  Output    250 ml  Net    353 ml    Exam VITAL SIGNS: Blood pressure 108/66, pulse 88, temperature 97.6 F (36.4 C), temperature source Oral, resp. rate 16, height 5\' 5"  (1.651 m), weight 58.968 kg (130 lb), SpO2 100 %.  GENERAL:  50 y.o.-year-old patient lying in the bed with no acute distress.   EYES: Pupils equal, round, reactive to light and accommodation. No scleral icterus. Extraocular muscles intact.  HEENT: Head atraumatic, normocephalic. Oropharynx and nasopharynx clear.  NECK:  Supple, no jugular venous distention. No thyroid enlargement, no tenderness.  LUNGS: Normal breath sounds bilaterally, no wheezing, rales,rhonchi or crepitation. No use of accessory muscles of respiration.  CARDIOVASCULAR: S1, S2 normal. No murmurs, rubs, or gallops.  ABDOMEN: Soft, nontender, nondistended. Bowel sounds present. No organomegaly or mass.  EXTREMITIES: No pedal edema, cyanosis, or clubbing.  NEUROLOGIC: Cranial nerves II through XII are intact. Muscle strength 5/5 in all extremities. Sensation intact. Gait not checked.  PSYCHIATRIC: The patient is alert and oriented x 3.  SKIN: No obvious rash, lesion, or ulcer.   Data Review     CBC w Diff: Lab Results  Component Value Date   WBC 7.5 10/24/2015   HGB 9.8* 10/24/2015   HCT 29.7* 10/24/2015   PLT 542* 10/24/2015   LYMPHOPCT 8% 10/21/2015   MONOPCT 5% 10/21/2015   EOSPCT 0% 10/21/2015   BASOPCT 1% 10/21/2015   CMP: Lab Results  Component Value Date   NA 133* 10/24/2015   K  4.3 10/24/2015   CL 96* 10/24/2015   CO2 27 10/24/2015   BUN 20 10/24/2015   CREATININE 0.62 10/24/2015   PROT 8.1 10/21/2015   ALBUMIN 2.8* 10/21/2015   BILITOT 0.1* 10/21/2015   ALKPHOS 80 10/21/2015   AST 18 10/21/2015   ALT 19 10/21/2015  .  Micro Results Recent Results (from the past 240 hour(s))  Blood Culture (routine x 2)     Status: None (Preliminary result)   Collection Time: 10/21/15 12:17 AM  Result Value Ref Range Status   Specimen Description BLOOD LEFT ANTECUBITAL  Final   Special Requests BOTTLES DRAWN AEROBIC AND ANAEROBIC  Final   Culture NO GROWTH 4 DAYS  Final   Report Status PENDING  Incomplete  Blood Culture (routine x 2)     Status: None (Preliminary result)   Collection Time: 10/21/15 12:50 AM  Result Value Ref  Range Status   Specimen Description BLOOD LEFT WRIST  Final   Special Requests BOTTLES DRAWN AEROBIC AND ANAEROBIC  Final   Culture  Setup Time   Final    GRAM POSITIVE COCCI AEROBIC BOTTLE ONLY CRITICAL RESULT CALLED TO, READ BACK BY AND VERIFIED WITHFulton Reek @ [phone] 10/22/15 by [facility]    Culture   Final    COAGULASE NEGATIVE STAPHYLOCOCCUS Results consistent with contamination.    Report Status PENDING  Incomplete  Blood Culture ID Panel (Reflexed)     Status: Abnormal   Collection Time: 10/21/15 12:50 AM  Result Value Ref Range Status   Enterococcus species NOT DETECTED NOT DETECTED Final   Vancomycin resistance NOT DETECTED NOT DETECTED Final   Listeria monocytogenes NOT DETECTED NOT DETECTED Final   Staphylococcus species DETECTED (A) NOT DETECTED Final    Comment: CRITICAL RESULT CALLED TO, READ BACK BY AND VERIFIED WITH: Matt Mcbane @ [phone] 10/22/15 by [facility]    Staphylococcus aureus NOT DETECTED NOT DETECTED Final   Methicillin resistance DETECTED (A) NOT DETECTED Final    Comment: CRITICAL RESULT CALLED TO, READ BACK BY AND VERIFIED WITH: Fulton Reek @ [phone] 10/22/15 by [facility]    Streptococcus species NOT DETECTED NOT DETECTED Final   Streptococcus agalactiae NOT DETECTED NOT DETECTED Final   Streptococcus pneumoniae NOT DETECTED NOT DETECTED Final   Streptococcus pyogenes NOT DETECTED NOT DETECTED Final   Acinetobacter baumannii NOT DETECTED NOT DETECTED Final   Enterobacteriaceae species NOT DETECTED NOT DETECTED Final   Enterobacter cloacae complex NOT DETECTED NOT DETECTED Final   Escherichia coli NOT DETECTED NOT DETECTED Final   Klebsiella oxytoca NOT DETECTED NOT DETECTED Final   Klebsiella pneumoniae NOT DETECTED NOT DETECTED Final   Proteus species NOT DETECTED NOT DETECTED Final   Serratia marcescens NOT DETECTED NOT DETECTED Final   Carbapenem resistance NOT DETECTED NOT DETECTED Final   Haemophilus influenzae NOT DETECTED NOT DETECTED Final   Neisseria  meningitidis NOT DETECTED NOT DETECTED Final   Pseudomonas aeruginosa NOT DETECTED NOT DETECTED Final   Candida albicans NOT DETECTED NOT DETECTED Final   Candida glabrata NOT DETECTED NOT DETECTED Final   Candida krusei NOT DETECTED NOT DETECTED Final   Candida parapsilosis NOT DETECTED NOT DETECTED Final   Candida tropicalis NOT DETECTED NOT DETECTED Final  Urine culture     Status: None   Collection Time: 10/21/15  2:40 AM  Result Value Ref Range Status   Specimen Description URINE, RANDOM  Final   Special Requests NONE  Final   Culture NO GROWTH 1 DAY  Final  Report Status 10/22/2015 FINAL  Final  Wound culture     Status: None   Collection Time: 10/21/15  1:51 PM  Result Value Ref Range Status   Specimen Description ABSCESS  Final   Special Requests Normal  Final   Gram Stain RARE WBC SEEN FEW GRAM POSITIVE COCCI   Final   Culture   Final    MODERATE GROWTH METHICILLIN RESISTANT STAPHYLOCOCCUS AUREUS   Report Status 10/23/2015 FINAL  Final   Organism ID, Bacteria METHICILLIN RESISTANT STAPHYLOCOCCUS AUREUS  Final      Susceptibility   Methicillin resistant staphylococcus aureus - MIC*    CIPROFLOXACIN >=8 RESISTANT Resistant     ERYTHROMYCIN <=0.25 SENSITIVE Sensitive     GENTAMICIN 8 INTERMEDIATE Intermediate     OXACILLIN >=4 RESISTANT Resistant     TETRACYCLINE <=1 SENSITIVE Sensitive     VANCOMYCIN 1 SENSITIVE Sensitive     TRIMETH/SULFA <=10 SENSITIVE Sensitive     CLINDAMYCIN <=0.25 SENSITIVE Sensitive     RIFAMPIN <=0.5 SENSITIVE Sensitive     Inducible Clindamycin NEGATIVE Sensitive     * MODERATE GROWTH METHICILLIN RESISTANT STAPHYLOCOCCUS AUREUS        Code Status Orders        Start     Ordered   10/21/15 0424  Full code   Continuous     10/21/15 0423    Code Status History    Date Active Date Inactive Code Status Order ID Comments User Context   09/24/2015  3:14 PM 10/09/2015  7:40 PM Full Code [id number]  Sylvan Cheese, RN Inpatient           Follow-up Information    Follow up with Hulda Marin, MD On 10/29/2015.   Specialty:  Cardiothoracic Surgery   Why:  945 in [place] office. YOU WILL NEED TO GO TO [facility] FOR XRAY/CHEST AT 845 BEFORE APPOINTMENT   Contact information:   [address] [place] [address] [phone]       Follow up with FITZGERALD, DAVID P, MD In 2 weeks.   Specialty:  Infectious Diseases   Contact information:   [address] [place] [address] [phone]       Discharge Medications     Medication List    TAKE these medications        atorvastatin 20 MG tablet  Commonly known as:  LIPITOR  Take 20 mg by mouth every evening.     doxycycline 100 MG tablet  Commonly known as:  VIBRA-TABS  Take 1 tablet (100 mg total) by mouth 2 (two) times daily.     insulin glargine 100 UNIT/ML injection  Commonly known as:  LANTUS  Inject 36 Units into the skin at bedtime.     lisinopril 10 MG tablet  Commonly known as:  PRINIVIL,ZESTRIL  Take 10 mg by mouth daily.     metFORMIN 1000 MG tablet  Commonly known as:  GLUCOPHAGE  Take 1,000 mg by mouth 2 (two) times daily with a meal.     ondansetron 4 MG tablet  Commonly known as:  ZOFRAN  Take 1 tablet (4 mg total) by mouth every 8 (eight) hours as needed for nausea or vomiting.     oxyCODONE-acetaminophen 5-325 MG tablet  Commonly known as:  PERCOCET/ROXICET  Take 1 tablet by mouth every 6 (six) hours as needed for moderate pain.           Total Time in preparing paper work, data evaluation and todays exam -  35 minutes  Auburn Bilberry M.D on 10/25/2015 at 1:42 PM  [facility]   Office  [phone]

## 2015-10-25 NOTE — Discharge Instructions (Signed)
°  DIET:  Diabetic diet  DISCHARGE CONDITION:  Good  ACTIVITY:  Activity as tolerated  OXYGEN:  Home Oxygen: Yes.     Oxygen Delivery: room air  DISCHARGE LOCATION:  home    ADDITIONAL DISCHARGE INSTRUCTION: Cleanse around chest tube sites daily with Hibiclens or other equivalent wound cleanser and cover with 4 X 4 gauze.  Change prn and daily.   If you experience worsening of your admission symptoms, develop shortness of breath, life threatening emergency, suicidal or homicidal thoughts you must seek medical attention immediately by calling 911 or calling your MD immediately  if symptoms less severe.  You Must read complete instructions/literature along with all the possible adverse reactions/side effects for all the Medicines you take and that have been prescribed to you. Take any new Medicines after you have completely understood and accpet all the possible adverse reactions/side effects.   Please note  You were cared for by a hospitalist during your hospital stay. If you have any questions about your discharge medications or the care you received while you were in the hospital after you are discharged, you can call the unit and asked to speak with the hospitalist on call if the hospitalist that took care of you is not available. Once you are discharged, your primary care physician will handle any further medical issues. Please note that NO REFILLS for any discharge medications will be authorized once you are discharged, as it is imperative that you return to your primary care physician (or establish a relationship with a primary care physician if you do not have one) for your aftercare needs so that they can reassess your need for medications and monitor your lab values.

## 2015-10-26 LAB — CULTURE, BLOOD (ROUTINE X 2): Culture: NO GROWTH

## 2015-10-28 ENCOUNTER — Ambulatory Visit
Admission: RE | Admit: 2015-10-28 | Discharge: 2015-10-28 | Disposition: A | Discharge status: Home / Self Care | Payer: Medicaid Other | Source: Ambulatory Visit | Attending: Cardiothoracic Surgery | Admitting: Cardiothoracic Surgery

## 2015-10-28 ENCOUNTER — Telehealth: Payer: Self-pay | Admitting: Cardiothoracic Surgery

## 2015-10-28 DIAGNOSIS — Z9889 Other specified postprocedural states: Secondary | ICD-10-CM

## 2015-10-28 NOTE — Telephone Encounter (Signed)
Returned phone call to patient's brother at this time explain that Mr. Sean [name] will indeed need another chest x-ray prior to appointment tomorrow. I also explained why chest x-ray is needed each visit. Patient's brother did ask that he could bring the patient the day prior as he has difficulty getting him there in the morning before. I explained that this would be ok.   We will see patient in office tomorrow.

## 2015-10-28 NOTE — Telephone Encounter (Signed)
Please call patients brother, Burna SisSaif. He has some questions about the x-ray that Dr Thelma Bargeaks has ordered. He would like to know if he can have the x-ray done today? Also, he has some questions about why an x-ray is ordered every week? You may reach him on his cell phone at [phone], I have also confirmed with him his brothers appointment with Dr Thelma Bargeaks tomorrow, 10/29/15 at 9:45am.

## 2015-10-29 ENCOUNTER — Encounter: Payer: Self-pay | Admitting: Cardiothoracic Surgery

## 2015-10-29 ENCOUNTER — Ambulatory Visit (INDEPENDENT_AMBULATORY_CARE_PROVIDER_SITE_OTHER): Payer: Medicaid Other | Admitting: Cardiothoracic Surgery

## 2015-10-29 VITALS — BP 114/71 | HR 105 | Temp 98.0°F | Ht 65.0 in | Wt 130.0 lb

## 2015-10-29 DIAGNOSIS — J869 Pyothorax without fistula: Secondary | ICD-10-CM

## 2015-10-29 NOTE — Progress Notes (Signed)
He returns today in follow-up. He did start his doxycycline yesterday. There is a delay in institution of his oral antibiotics. He denied any fevers. He does have some discomfort. His brother states that his blood sugars have been in the 300-400 range on occasion.  Today his wounds look fine. His thoracotomy wound is well healed. The 3 chest tube sites are all free of any significant purulent drainage or erythema. I did advance the chest tube some. This was approximately 4-5 cm. His chest x-ray prior to doing so looked fine. There is no pleural effusion or pneumothorax.  I will see him back again next Tuesday.

## 2015-11-01 ENCOUNTER — Ambulatory Visit
Admission: RE | Admit: 2015-11-01 | Discharge: 2015-11-01 | Disposition: A | Discharge status: Home / Self Care | Payer: Medicaid Other | Source: Ambulatory Visit | Attending: Cardiothoracic Surgery | Admitting: Cardiothoracic Surgery

## 2015-11-01 DIAGNOSIS — J869 Pyothorax without fistula: Secondary | ICD-10-CM | POA: Insufficient documentation

## 2015-11-01 DIAGNOSIS — R918 Other nonspecific abnormal finding of lung field: Secondary | ICD-10-CM | POA: Insufficient documentation

## 2015-11-02 ENCOUNTER — Inpatient Hospital Stay: Payer: Self-pay | Admitting: Cardiothoracic Surgery

## 2015-11-02 ENCOUNTER — Ambulatory Visit (INDEPENDENT_AMBULATORY_CARE_PROVIDER_SITE_OTHER): Payer: Medicaid Other | Admitting: Cardiothoracic Surgery

## 2015-11-02 ENCOUNTER — Encounter: Payer: Self-pay | Admitting: Cardiothoracic Surgery

## 2015-11-02 ENCOUNTER — Other Ambulatory Visit: Payer: Self-pay | Admitting: Cardiothoracic Surgery

## 2015-11-02 VITALS — BP 112/64 | HR 68 | Temp 97.5°F | Ht 65.0 in | Wt 130.0 lb

## 2015-11-02 DIAGNOSIS — J869 Pyothorax without fistula: Secondary | ICD-10-CM

## 2015-11-02 NOTE — Progress Notes (Signed)
He is accompanied today by his brother tells me that he's had some itching around the chest tubes. He had a chest x-ray yesterday that looked quite good. No fevers or chills. We withdrew the chest tubes another 4-5 cm. The most anterior tube was removed today and the wound was packed with quarter inch iodoform gauze. I instructed the brother on how to manage this. We gave him the gauze as well as a disposable suture removal kit and explained to him in great detail how to manage the wounds. We then resecured the other 2 chest tubes. I will see him back again in one week.

## 2015-11-02 NOTE — Telephone Encounter (Signed)
Please call patients brother, Burna SisSaif. He had to cancel todays appointment [facility] took longer than anticipated. I have rescheduled him for Tuesday May 23rd at 11am with Dr Thelma Bargeaks, as Dr Thelma Bargeaks in not in clinic this Friday May 19th. Burna SisSaif is not entirely happy about having to wait that long for his brother to be seen. He states his brother needs medication refill, is in pain, has a lot of itching, has some drainage and also needs an antibiotic ointment for his dressing. He continues to ask if he can come in to be seen this week. I told him I would have the nurse to call him to discuss the symptoms his brother is having and we'll go from there. (maybe a nurse visit)  He also called back and said his brother had his xray yesterday and now that he has had to move his appointment to May 23rd is there going to be another xray ordered for next week and when and what time should he arrive? Please call and advise.

## 2015-11-02 NOTE — Telephone Encounter (Signed)
Patient coming in today for appointment 

## 2015-11-02 NOTE — Patient Instructions (Addendum)
Please remove packing material and clean area, repack with Lidofoam and apply clean dressing daily. Please follow-up in the office as scheduled below.

## 2015-11-03 ENCOUNTER — Telehealth: Payer: Self-pay

## 2015-11-03 NOTE — Telephone Encounter (Signed)
ERROR

## 2015-11-03 NOTE — Telephone Encounter (Signed)
I spoke with Sean [name] and she wanted a verbal order to decrease her visits from 3 times a week to 2 times a week. Verbal order was given.   I discussed the above with [name].

## 2015-11-09 ENCOUNTER — Ambulatory Visit: Payer: Self-pay | Admitting: Cardiothoracic Surgery

## 2015-11-09 ENCOUNTER — Encounter: Payer: Self-pay | Admitting: Cardiothoracic Surgery

## 2015-11-09 ENCOUNTER — Encounter (INDEPENDENT_AMBULATORY_CARE_PROVIDER_SITE_OTHER): Payer: Self-pay

## 2015-11-09 ENCOUNTER — Ambulatory Visit (INDEPENDENT_AMBULATORY_CARE_PROVIDER_SITE_OTHER): Payer: Medicaid Other | Admitting: Cardiothoracic Surgery

## 2015-11-09 VITALS — BP 111/75 | HR 91 | Temp 97.6°F | Ht 65.0 in | Wt 130.0 lb

## 2015-11-09 DIAGNOSIS — E871 Hypo-osmolality and hyponatremia: Secondary | ICD-10-CM | POA: Insufficient documentation

## 2015-11-09 DIAGNOSIS — Z8619 Personal history of other infectious and parasitic diseases: Secondary | ICD-10-CM | POA: Insufficient documentation

## 2015-11-09 DIAGNOSIS — J189 Pneumonia, unspecified organism: Secondary | ICD-10-CM | POA: Insufficient documentation

## 2015-11-09 DIAGNOSIS — J869 Pyothorax without fistula: Secondary | ICD-10-CM | POA: Insufficient documentation

## 2015-11-09 NOTE — Progress Notes (Signed)
This patient presents today in follow-up. His brother brings him in without any complaints. He states he's been doing very well. He did see Dr. Sampson GoonFitzgerald earlier today place the patient on doxycycline twice per day however the patient's brothers only been administering it once per day. He denies any fevers. His appetites been good. The most anterior chest tube site is now almost completely closed. I did remove the other 2 chest tubes today. They're only in a short distance. We again instructed the patient's brother on how to pack the wounds and we'll see him back again in 2 weeks with chest x-ray. Hopefully that will be his final visit.

## 2015-11-09 NOTE — Patient Instructions (Signed)
We will schedule your chest xray and you will need to have this done prior to your appointment. We have scheduled your 2 week follow up appointment with Dr. Thelma Bargeaks. Please see below.

## 2015-11-22 ENCOUNTER — Ambulatory Visit
Admission: RE | Admit: 2015-11-22 | Discharge: 2015-11-22 | Disposition: A | Discharge status: Home / Self Care | Payer: Medicaid Other | Source: Ambulatory Visit | Attending: Cardiothoracic Surgery | Admitting: Cardiothoracic Surgery

## 2015-11-22 DIAGNOSIS — J869 Pyothorax without fistula: Secondary | ICD-10-CM | POA: Diagnosis present

## 2015-11-22 DIAGNOSIS — R918 Other nonspecific abnormal finding of lung field: Secondary | ICD-10-CM | POA: Diagnosis not present

## 2015-11-23 ENCOUNTER — Ambulatory Visit (INDEPENDENT_AMBULATORY_CARE_PROVIDER_SITE_OTHER): Payer: Medicaid Other | Admitting: Cardiothoracic Surgery

## 2015-11-23 ENCOUNTER — Encounter: Payer: Self-pay | Admitting: Cardiothoracic Surgery

## 2015-11-23 VITALS — BP 112/80 | HR 102 | Temp 98.4°F | Ht 65.0 in | Wt 127.4 lb

## 2015-11-23 DIAGNOSIS — J869 Pyothorax without fistula: Secondary | ICD-10-CM

## 2015-11-23 NOTE — Progress Notes (Signed)
He returns today with his brother. His brother states he's been packing his wounds once a day. They now will barely admit the tip of a Q-tip. He denies any fever or chills. He's asked to gain some weight. Today on exam his wounds are all healing as expected. The posterior 2 chest tube sites can barely admit the tip of a Q-tip. He had a chest x-ray made yesterday which looks good. I told the patient's brother that he could use a Band-Aid on the wounds and that no further follow-up was needed.

## 2015-11-23 NOTE — Patient Instructions (Signed)
Please call our office if you have any questions or concerns.  

## 2016-12-16 IMAGING — CR DG CHEST 2V
3 series · 3 of 3 positions shown · non-contrast
Comparison: 10/05/2015

CLINICAL DATA: Postop common left chest tubes

EXAM:
CHEST  2 VIEW

[chest lat (1 of 2)]
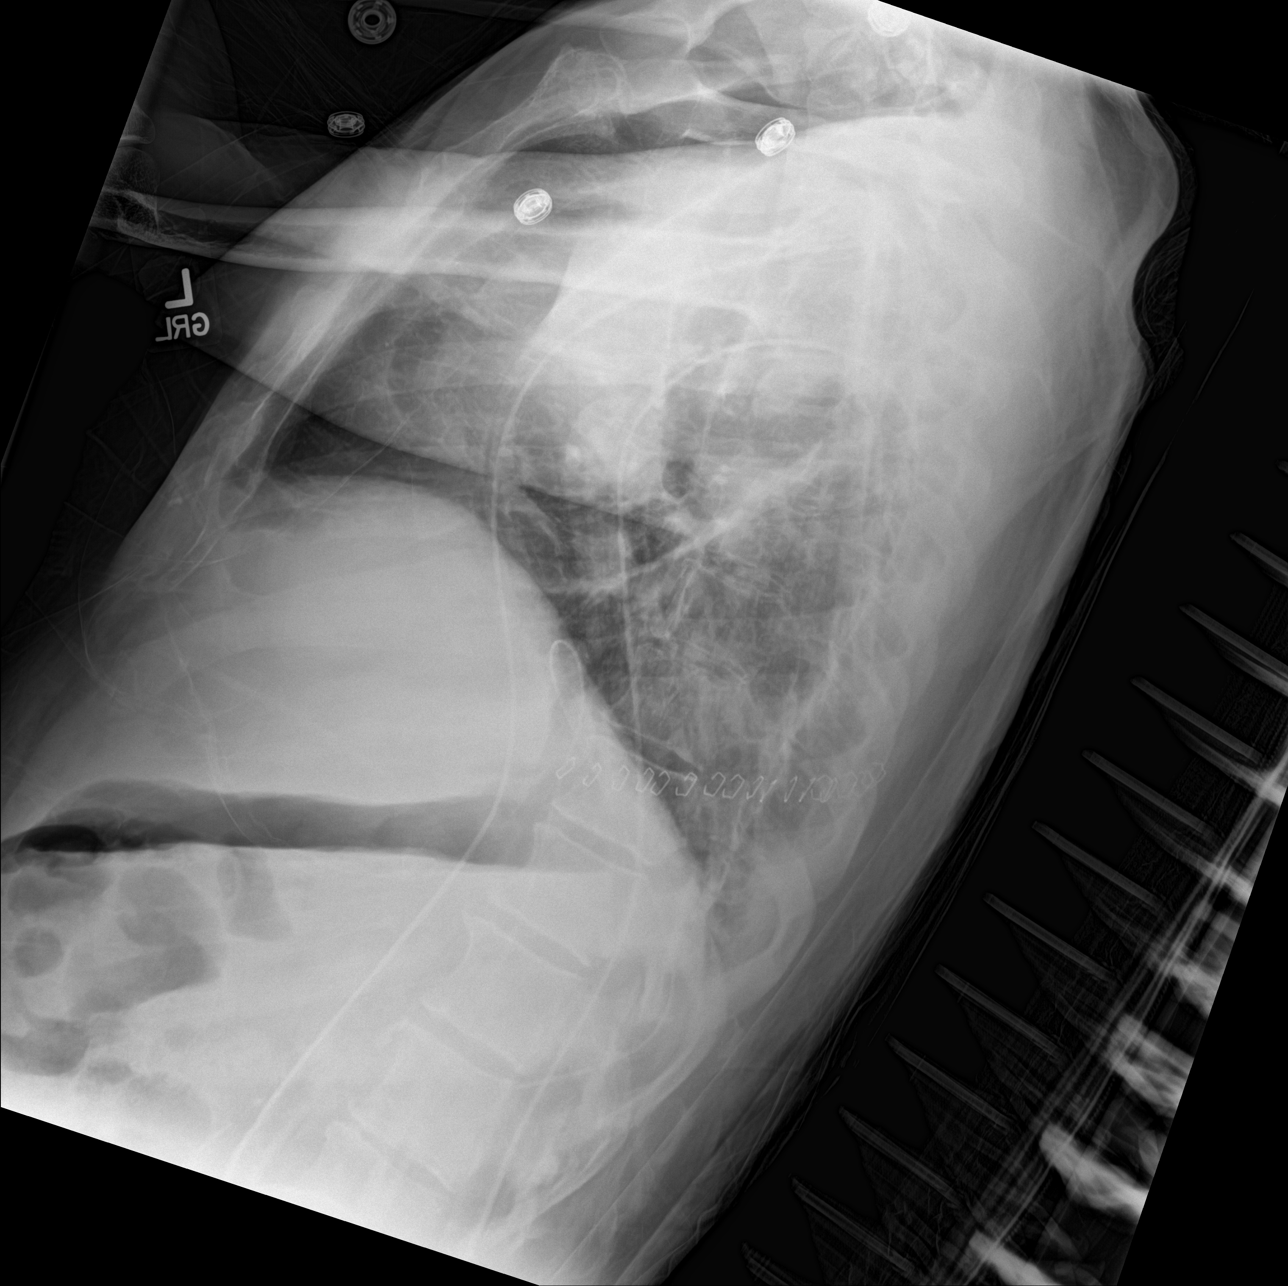

[chest ap]
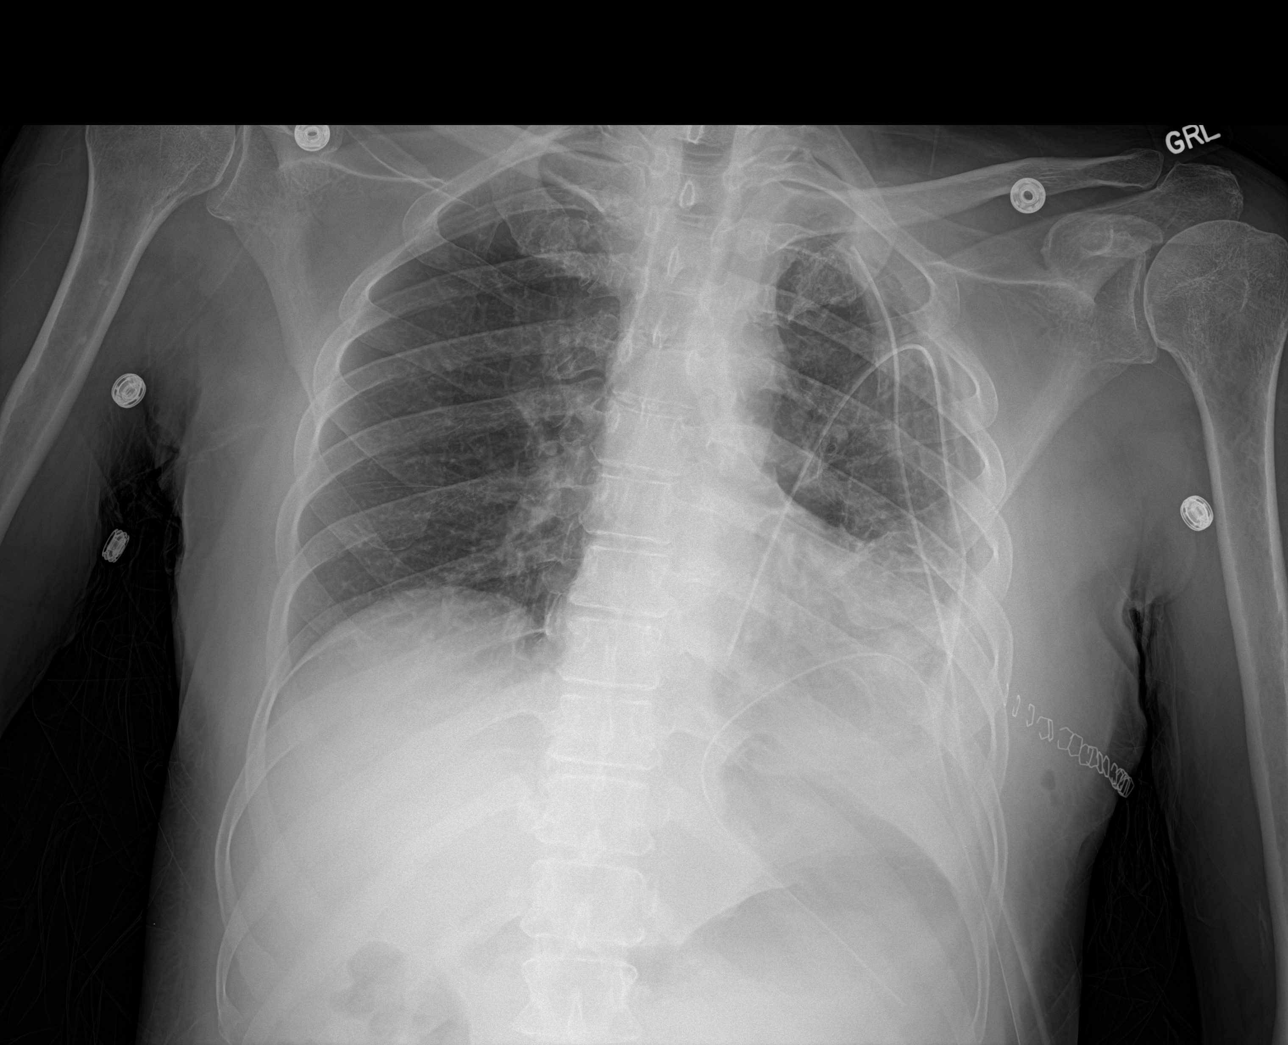

[chest lat (2 of 2)]
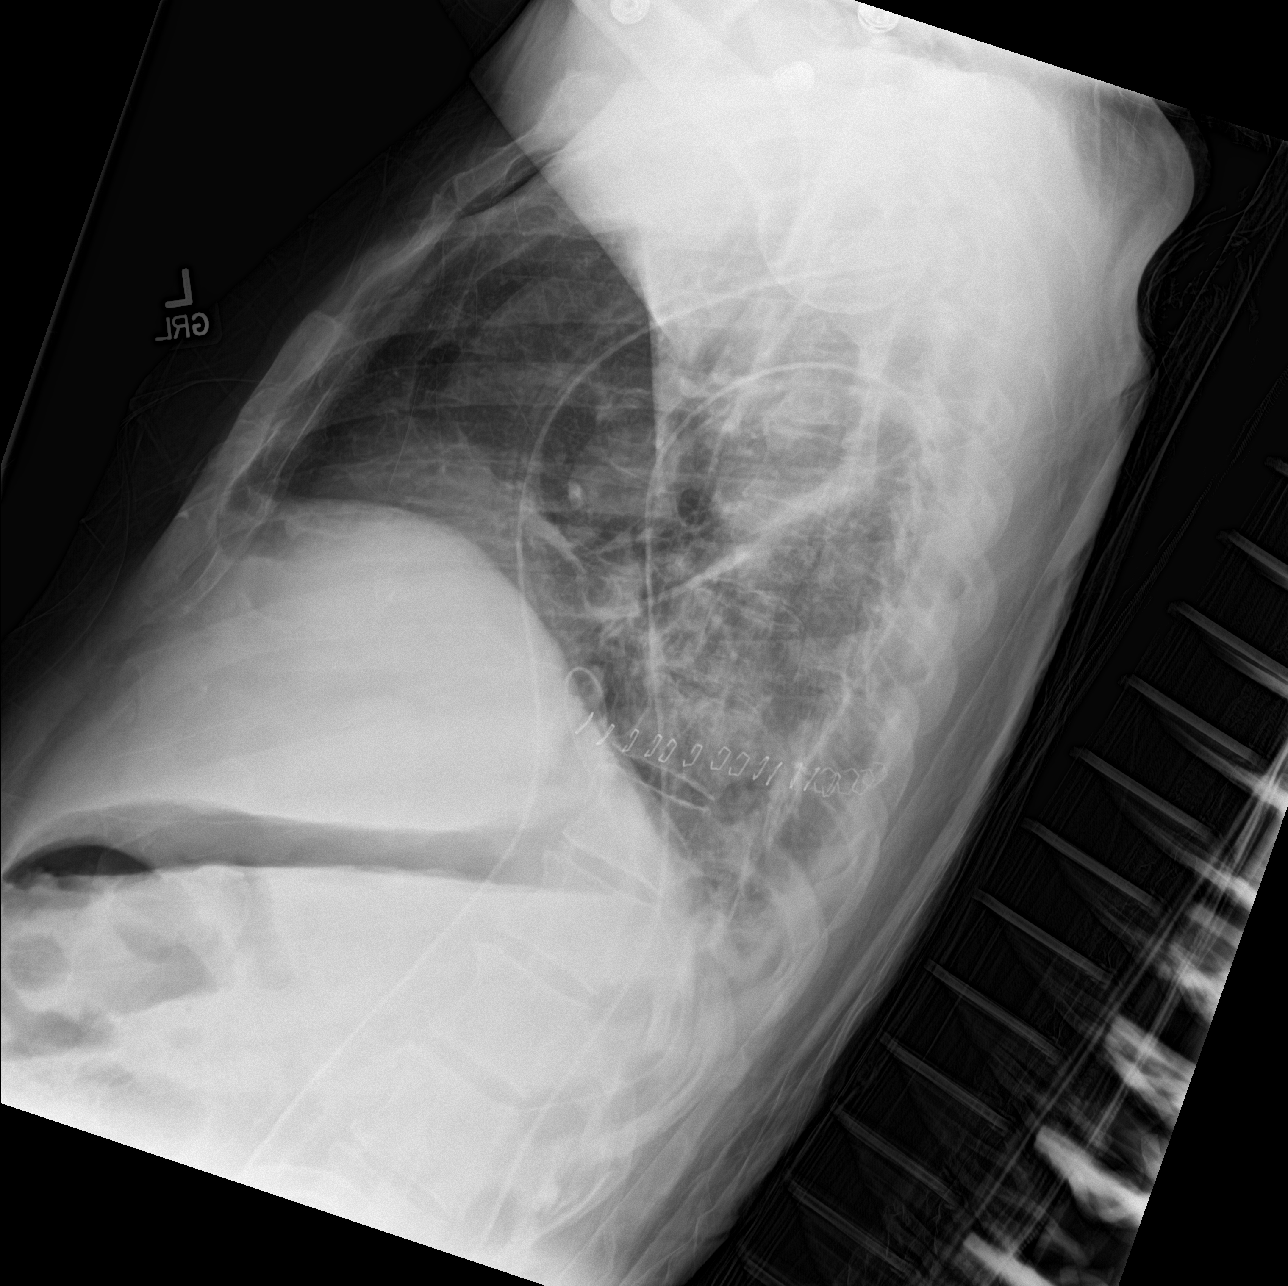

[3 of 3 positions shown; findings below may reference images not displayed]

FINDINGS: Left chest tubes remain in place. No pneumothorax. Left base
atelectasis. Right lung is clear. Heart is normal size.
IMPRESSION: Left chest tubes remain in place without visible pneumothorax. Left
base atelectasis.

## 2016-12-22 IMAGING — CR DG CHEST 2V
2 series · 2 of 2 positions shown · non-contrast
Comparison: Chest x-ray of [DATE]

CLINICAL DATA: Status post thoracoscopy and left thoracotomy and
decortication on [REDACTED] ; possible malpositioning of chest
tubes.

EXAM:
CHEST  2 VIEW

[chest pa]
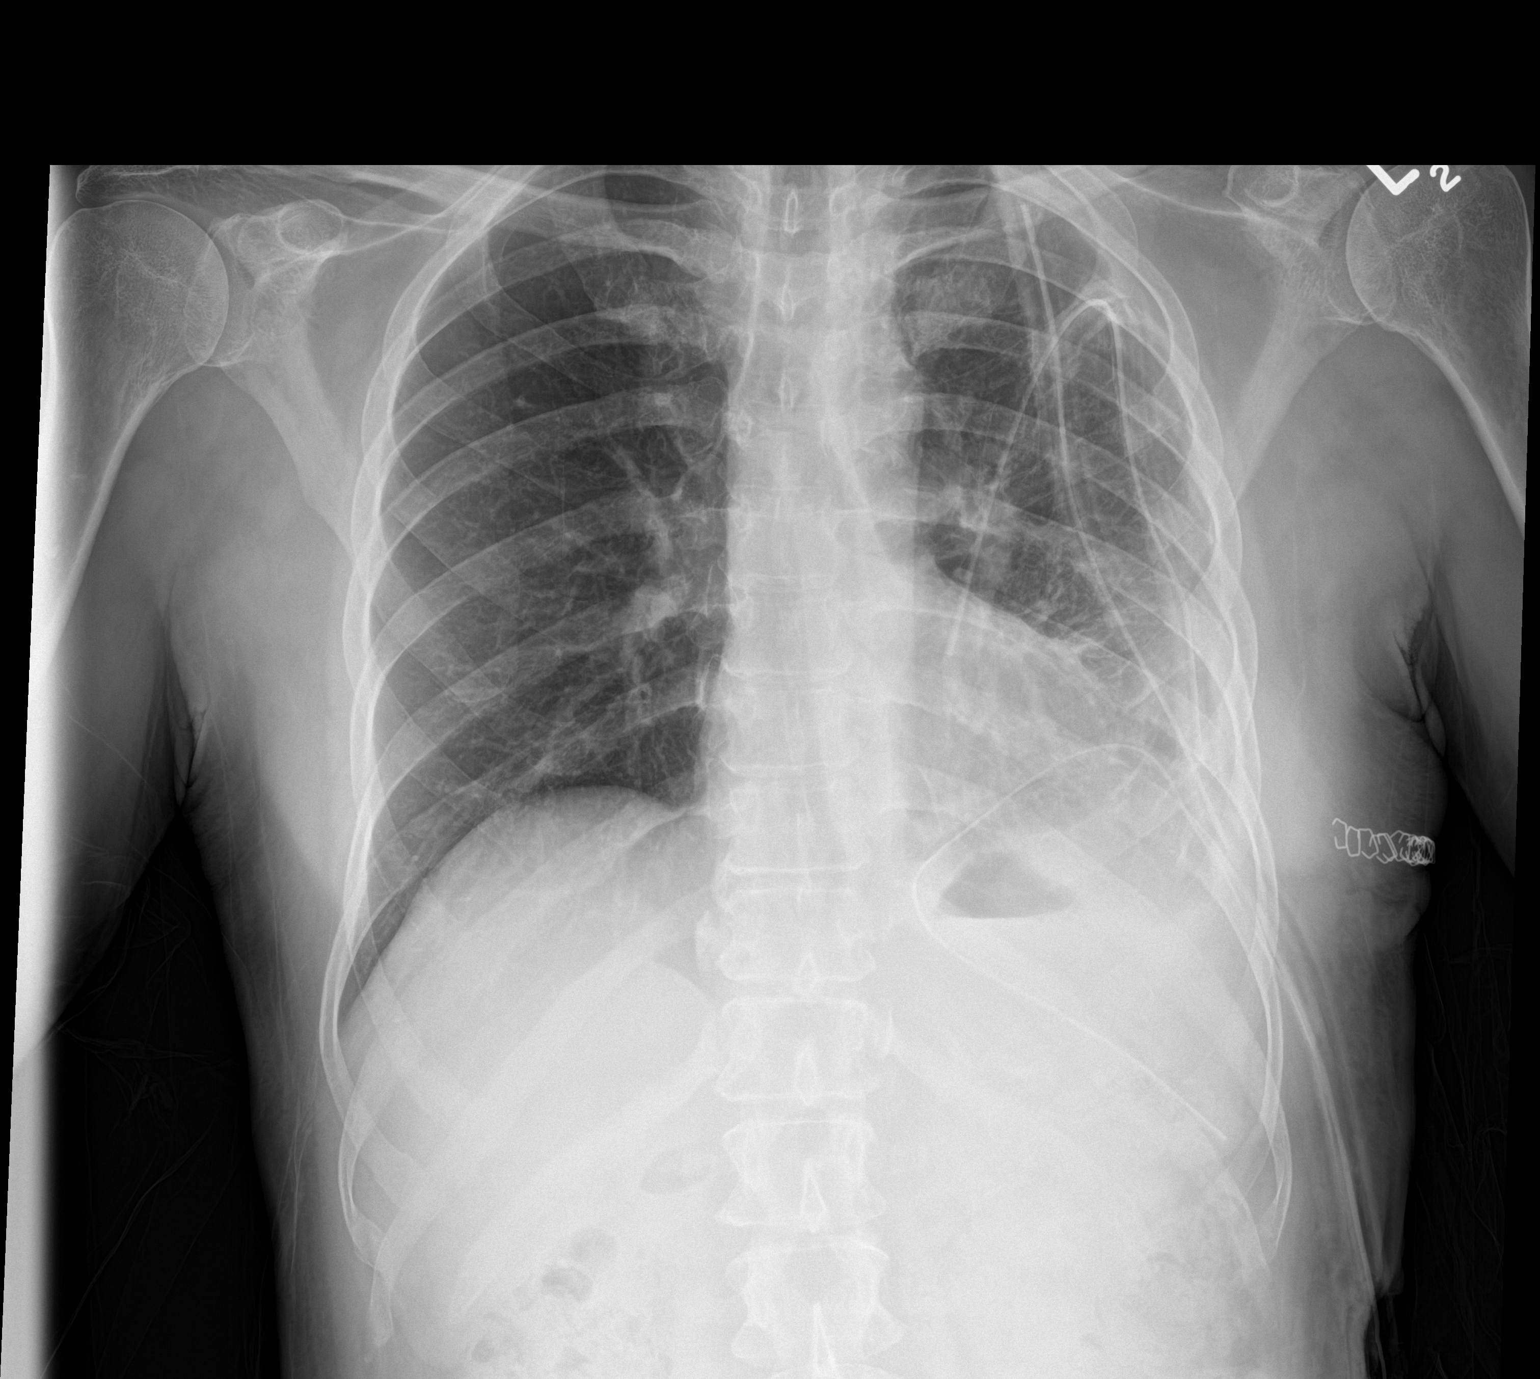

[chest lat]
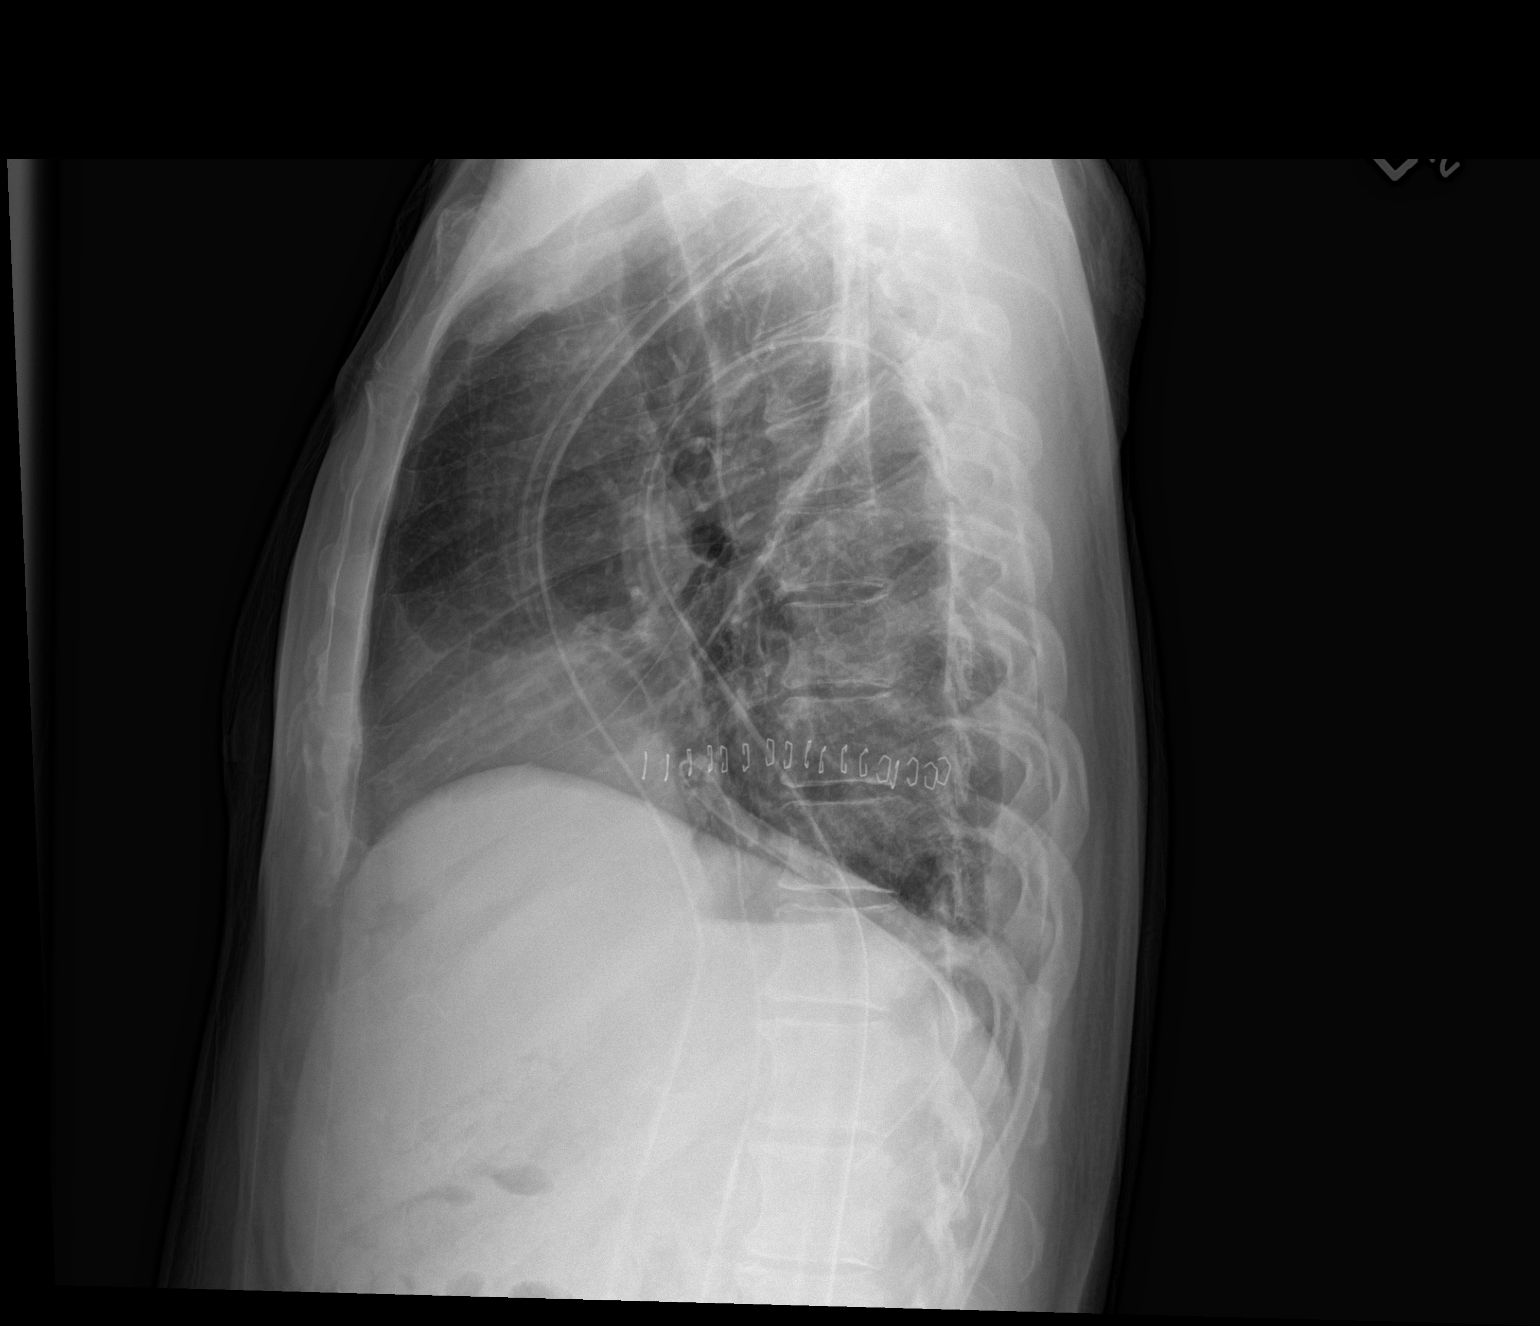

[2 of 2 positions shown; findings below may reference images not displayed]

FINDINGS: There remain 3 chest tubes on the left. Positioning appears stable.
The upper chest tube tip projects over the posterior medial aspect
of the third rib. The middle chest tube tip projects over the medial
interspace between the eighth and ninth ribs. The lower chest tube
tip projects in the lateral interspace between the tenth and
eleventh ribs. There is a trace of pleural fluid present. No
pneumothorax is observed. The right lung is clear. There is no shift
of the mediastinum. The bony structures are unremarkable.
IMPRESSION: Stable positioning of the 3 left-sided chest tubes. No pneumothorax.
Only a small amount of pleural fluid remains.

## 2019-09-26 ENCOUNTER — Ambulatory Visit: Payer: Medicaid Other | Attending: Internal Medicine

## 2019-09-26 DIAGNOSIS — Z23 Encounter for immunization: Secondary | ICD-10-CM

## 2019-09-26 NOTE — Progress Notes (Signed)
   Covid-19 Vaccination Clinic  Name:  Sean [name]    MRN: [id number] DOB: 09-Sep-1965  09/26/2019  Mr. Sean [name] was observed post Covid-19 immunization for 15 minutes without incident. He was provided with Vaccine Information Sheet and instruction to access the V-Safe system.   Mr. Sean [name] was instructed to call 911 with any severe reactions post vaccine: Marland Kitchen Difficulty breathing  . Swelling of face and throat  . A fast heartbeat  . A bad rash all over body  . Dizziness and weakness   Immunizations Administered    Name Date Dose VIS Date Route   Pfizer COVID-19 Vaccine 09/26/2019  5:42 PM 0.3 mL 05/30/2019 Intramuscular   Manufacturer: [employer], [employer]   Lot: [id number]   NDC: [address]-1

## 2019-09-27 ENCOUNTER — Ambulatory Visit: Payer: Self-pay

## 2019-10-22 ENCOUNTER — Ambulatory Visit: Payer: Medicaid Other | Attending: Internal Medicine

## 2019-10-22 DIAGNOSIS — Z23 Encounter for immunization: Secondary | ICD-10-CM

## 2019-10-22 NOTE — Progress Notes (Signed)
   Covid-19 Vaccination Clinic  Name:  Shamon Cothran    MRN: [id number] DOB: 1965-12-27  10/22/2019  Mr. Mcmanamon was observed post Covid-19 immunization for 15 minutes without incident. He was provided with Vaccine Information Sheet and instruction to access the V-Safe system.   Mr. Almond was instructed to call 911 with any severe reactions post vaccine: Marland Kitchen Difficulty breathing  . Swelling of face and throat  . A fast heartbeat  . A bad rash all over body  . Dizziness and weakness   Immunizations Administered    Name Date Dose VIS Date Route   Pfizer COVID-19 Vaccine 10/22/2019  3:42 PM 0.3 mL 08/13/2018 Intramuscular   Manufacturer: [employer], [employer]   Lot: [id number]   NDC: [address]-2

## 2019-10-28 ENCOUNTER — Ambulatory Visit: Payer: Medicaid Other

## 2019-10-29 ENCOUNTER — Ambulatory Visit: Payer: Medicaid Other

## 2021-03-25 ENCOUNTER — Other Ambulatory Visit: Payer: Self-pay

## 2021-04-20 ENCOUNTER — Encounter: Payer: Self-pay | Admitting: Ophthalmology

## 2021-04-21 NOTE — Anesthesia Preprocedure Evaluation (Addendum)
Anesthesia Evaluation  Patient identified by MRN, date of birth, ID band Patient awake    Reviewed: NPO status   History of Anesthesia Complications Negative for: history of anesthetic complications  Airway Mallampati: II  TM Distance: >3 FB Neck ROM: full    Dental no notable dental hx.    Pulmonary  Hx empyema drainage 2017   Pulmonary exam normal        Cardiovascular Exercise Tolerance: Good hypertension, Normal cardiovascular exam     Neuro/Psych  Intellectual disability;  Cognitive impairment. Translated Urdu by pt's brother. negative psych ROS   GI/Hepatic negative GI ROS, Neg liver ROS,   Endo/Other  diabetes (A1C 10.5 on 11/04/20)  Renal/GU negative Renal ROS  negative genitourinary   Musculoskeletal   Abdominal   Peds  Hematology negative hematology ROS (+)   Anesthesia Other Findings Last PCP (Dr. Clide Deutscher) visit 02/02/21 ([place]). Paper copy of note on physical chart. Issues addressed: - Poorly controlled DM - HTN (147/65, 126/65) - HLD;  Mentally disabled; Rozell Searing Urdu by pt's brother. Hx from pt's brother.  Reproductive/Obstetrics                           Anesthesia Physical Anesthesia Plan  ASA: 3  Anesthesia Plan: MAC   Post-op Pain Management:    Induction:   PONV Risk Score and Plan: 1 and TIVA  Airway Management Planned:   Additional Equipment:   Intra-op Plan:   Post-operative Plan:   Informed Consent: I have reviewed the patients History and Physical, chart, labs and discussed the procedure including the risks, benefits and alternatives for the proposed anesthesia with the patient or authorized representative who has indicated his/her understanding and acceptance.       Plan Discussed with: CRNA  Anesthesia Plan Comments:        Anesthesia Quick Evaluation

## 2021-04-25 NOTE — Discharge Instructions (Signed)

## 2021-05-02 ENCOUNTER — Ambulatory Visit: Payer: Medicaid Other | Admitting: Anesthesiology

## 2021-05-02 ENCOUNTER — Other Ambulatory Visit: Payer: Self-pay

## 2021-05-02 ENCOUNTER — Ambulatory Visit
Admission: RE | Admit: 2021-05-02 | Discharge: 2021-05-02 | Disposition: A | Discharge status: Home / Self Care | Payer: Medicaid Other | Attending: Ophthalmology | Admitting: Ophthalmology

## 2021-05-02 ENCOUNTER — Encounter: Payer: Self-pay | Admitting: Ophthalmology

## 2021-05-02 ENCOUNTER — Encounter
Admission: RE | Disposition: A | Discharge status: Home / Self Care | Payer: Self-pay | Source: Home / Self Care | Attending: Ophthalmology

## 2021-05-02 DIAGNOSIS — E1136 Type 2 diabetes mellitus with diabetic cataract: Secondary | ICD-10-CM | POA: Diagnosis not present

## 2021-05-02 DIAGNOSIS — H2181 Floppy iris syndrome: Secondary | ICD-10-CM | POA: Insufficient documentation

## 2021-05-02 DIAGNOSIS — E113391 Type 2 diabetes mellitus with moderate nonproliferative diabetic retinopathy without macular edema, right eye: Secondary | ICD-10-CM | POA: Diagnosis not present

## 2021-05-02 HISTORY — PX: CATARACT EXTRACTION W/PHACO: SHX586

## 2021-05-02 LAB — GLUCOSE, CAPILLARY
Glucose-Capillary: 67 mg/dL — ABNORMAL LOW (ref 70–99)
Glucose-Capillary: 127 mg/dL — ABNORMAL HIGH (ref 70–99)
Glucose-Capillary: 103 mg/dL — ABNORMAL HIGH (ref 70–99)

## 2021-05-02 SURGERY — PHACOEMULSIFICATION, CATARACT, WITH IOL INSERTION
Anesthesia: Monitor Anesthesia Care | Site: Eye | Laterality: Right

## 2021-05-02 MED ORDER — DEXTROSE 50 % IV SOLN: 12.5000 mL | Freq: Once | INTRAVENOUS | Status: DC

## 2021-05-02 MED ORDER — TRIAMCINOLONE ACETONIDE 40 MG/ML IJ SUSP
INTRAMUSCULAR | Status: DC | PRN
  Administered 2021-05-02: .1 mL

## 2021-05-02 MED ORDER — MOXIFLOXACIN HCL 0.5 % OP SOLN
OPHTHALMIC | Status: DC | PRN
  Administered 2021-05-02: 0.2 mL via OPHTHALMIC

## 2021-05-02 MED ORDER — DEXTROSE 50 % IV SOLN
25.0000 mL | Freq: Once | INTRAVENOUS | Status: AC
  Administered 2021-05-02: 25 mL via INTRAVENOUS

## 2021-05-02 MED ORDER — DEXMEDETOMIDINE (PRECEDEX) IN NS 20 MCG/5ML (4 MCG/ML) IV SYRINGE
PREFILLED_SYRINGE | INTRAVENOUS | Status: DC | PRN
  Administered 2021-05-02 (×2): 10 ug via INTRAVENOUS

## 2021-05-02 MED ORDER — ARMC OPHTHALMIC DILATING DROPS
1.0000 "application " | OPHTHALMIC | Status: DC | PRN
  Administered 2021-05-02 (×3): 1 via OPHTHALMIC

## 2021-05-02 MED ORDER — SIGHTPATH DOSE#1 SODIUM HYALURONATE 23 MG/ML IO SOLUTION
PREFILLED_SYRINGE | INTRAOCULAR | Status: DC | PRN
  Administered 2021-05-02: 0.6 mL via INTRAOCULAR

## 2021-05-02 MED ORDER — LACTATED RINGERS IV SOLN: INTRAVENOUS | Status: DC

## 2021-05-02 MED ORDER — OXYCODONE HCL 5 MG/5ML PO SOLN: 5.0000 mg | Freq: Once | ORAL | Status: DC | PRN

## 2021-05-02 MED ORDER — PROPOFOL 10 MG/ML IV BOLUS
INTRAVENOUS | Status: DC | PRN
  Administered 2021-05-02: 20 mg via INTRAVENOUS

## 2021-05-02 MED ORDER — SIGHTPATH DOSE#1 BSS IO SOLN
INTRAOCULAR | Status: DC | PRN
  Administered 2021-05-02: 68 mL via OPHTHALMIC

## 2021-05-02 MED ORDER — LIDOCAINE HCL (PF) 2 % IJ SOLN
INTRAOCULAR | Status: DC | PRN
  Administered 2021-05-02: 1 mL via INTRAOCULAR

## 2021-05-02 MED ORDER — TETRACAINE HCL 0.5 % OP SOLN
1.0000 [drp] | OPHTHALMIC | Status: DC | PRN
  Administered 2021-05-02 (×3): 1 [drp] via OPHTHALMIC

## 2021-05-02 MED ORDER — SIGHTPATH DOSE#1 SODIUM HYALURONATE 10 MG/ML IO SOLUTION
PREFILLED_SYRINGE | INTRAOCULAR | Status: DC | PRN
  Administered 2021-05-02: 0.85 mL via INTRAOCULAR

## 2021-05-02 MED ORDER — OXYCODONE HCL 5 MG PO TABS: 5.0000 mg | ORAL_TABLET | Freq: Once | ORAL | Status: DC | PRN

## 2021-05-02 MED ORDER — NEOMYCIN-POLYMYXIN-DEXAMETH 3.5-10000-0.1 OP OINT
TOPICAL_OINTMENT | OPHTHALMIC | Status: DC | PRN
  Administered 2021-05-02: 1 via OPHTHALMIC

## 2021-05-02 MED ORDER — MIDAZOLAM HCL 2 MG/2ML IJ SOLN
INTRAMUSCULAR | Status: DC | PRN
  Administered 2021-05-02: 1 mg via INTRAVENOUS

## 2021-05-02 MED ORDER — FENTANYL CITRATE (PF) 100 MCG/2ML IJ SOLN
INTRAMUSCULAR | Status: DC | PRN
  Administered 2021-05-02: 50 ug via INTRAVENOUS

## 2021-05-02 MED ORDER — SIGHTPATH DOSE#1 BSS IO SOLN
INTRAOCULAR | Status: DC | PRN
  Administered 2021-05-02: 15 mL

## 2021-05-02 SURGICAL SUPPLY — 14 items
CANNULA ANT/CHMB 27GA (MISCELLANEOUS) ×2 IMPLANT
DISSECTOR HYDRO NUCLEUS 50X22 (MISCELLANEOUS) ×2 IMPLANT
GLOVE SURG GAMMEX PI TX LF 7.5 (GLOVE) ×2 IMPLANT
GLOVE SURG SYN 8.5  E (GLOVE) ×1
GLOVE SURG SYN 8.5 E (GLOVE) ×1 IMPLANT
GOWN STRL REUS W/ TWL LRG LVL3 (GOWN DISPOSABLE) ×2 IMPLANT
GOWN STRL REUS W/TWL LRG LVL3 (GOWN DISPOSABLE) ×4
LENS IOL TECNIS EYHANCE 23.0 (Intraocular Lens) ×2 IMPLANT
PACK EYE AFTER SURG (MISCELLANEOUS) ×2 IMPLANT
RING MALYGIN (MISCELLANEOUS) ×2 IMPLANT
SYR 3ML LL SCALE MARK (SYRINGE) ×2 IMPLANT
SYR TB 1ML LUER SLIP (SYRINGE) ×2 IMPLANT
WATER STERILE IRR 250ML POUR (IV SOLUTION) ×2 IMPLANT
WIPE NON LINTING 3.25X3.25 (MISCELLANEOUS) ×2 IMPLANT

## 2021-05-02 NOTE — Transfer of Care (Signed)
Immediate Anesthesia Transfer of Care Note  Patient: Sean [name]  Procedure(s) Performed: CATARACT EXTRACTION PHACO AND INTRAOCULAR LENS PLACEMENT (IOC) RIGHT INTRAVITREAL KENALOG INJ DIABETIC (Right: Eye)  Patient Location: PACU  Anesthesia Type: MAC  Level of Consciousness: awake, alert  and patient cooperative  Airway and Oxygen Therapy: Patient Spontanous Breathing and Patient connected to supplemental oxygen  Post-op Assessment: Post-op Vital signs reviewed, Patient's Cardiovascular Status Stable, Respiratory Function Stable, Patent Airway and No signs of Nausea or vomiting  Post-op Vital Signs: Reviewed and stable  Complications: No notable events documented.

## 2021-05-02 NOTE — Anesthesia Postprocedure Evaluation (Signed)
Anesthesia Post Note  Patient: Sean [name]  Procedure(s) Performed: CATARACT EXTRACTION PHACO AND INTRAOCULAR LENS PLACEMENT (IOC) RIGHT INTRAVITREAL KENALOG INJ DIABETIC (Right: Eye)     Patient location during evaluation: PACU Anesthesia Type: MAC Level of consciousness: awake and alert Pain management: pain level controlled Vital Signs Assessment: post-procedure vital signs reviewed and stable Respiratory status: spontaneous breathing, nonlabored ventilation, respiratory function stable and patient connected to nasal cannula oxygen Cardiovascular status: stable and blood pressure returned to baseline Postop Assessment: no apparent nausea or vomiting Anesthetic complications: no   No notable events documented.  Fidel Levy

## 2021-05-02 NOTE — Op Note (Signed)
OPERATIVE NOTE  Sean [name] [id number] 05/02/2021   PREOPERATIVE DIAGNOSIS:   1.   Nuclear sclerotic cataract right eye.  H25.11 2.  Moderate Nonproliferative diabetic retinopathy, with macular edema, right eye. L97.6734  POSTOPERATIVE DIAGNOSIS:   1.  Nuclear sclerotic cataract right eye.  H25.11 2. Moderate Nonproliferative diabetic retinopathy, with macular edema, right eye. L93.7902 3.  Intraoperative floppy iris syndrome.    PROCEDURE:    CPT [phone] Complex Phacoemusification with posterior chamber intraocular lens placement of the right eye requiring use of Malyguin ring for expansion of the pupil. CPT [id number] intravitreal Injection of kenalog, right eye.  LENS:   Implant Name Type Inv. Item Serial No. Manufacturer Lot No. LRB No. Used Action  LENS IOL TECNIS EYHANCE 23.0 - [id number] Intraocular Lens LENS IOL TECNIS EYHANCE 23.0 [id number]   Right 1 Implanted       Procedure(s) with comments: CATARACT EXTRACTION PHACO AND INTRAOCULAR LENS PLACEMENT (IOC) RIGHT INTRAVITREAL KENALOG INJ DIABETIC (Right) - Diabetic needs to be last 2.65 00:25.0  DIB00 +23.0   SURGEON:  Willey Blade, MD, MPH  ANESTHESIOLOGIST: Anesthesiologist: Orrin Brigham, MD CRNA: Maree Krabbe, CRNA   ANESTHESIA:  Topical with tetracaine drops augmented with 1% preservative-free intracameral lidocaine.  ESTIMATED BLOOD LOSS: less than 1 mL.   COMPLICATIONS:  None.   DESCRIPTION OF PROCEDURE:  The patient was identified in the holding room and transported to the operating room and placed in the supine position under the operating microscope.  The right eye was identified as the operative eye and it was prepped and draped in the usual sterile ophthalmic fashion.   A 1.0 millimeter clear-corneal paracentesis was made at the 10:30 position. 0.5 ml of preservative-free 1% lidocaine with epinephrine was injected into the anterior chamber.  The anterior chamber was filled with Healon 5  viscoelastic.  A 2.4 millimeter keratome was used to make a near-clear corneal incision at the 8:00 position.   The pupil was small and floppy so a malyugin ring was placed.  A curvilinear capsulorrhexis was made with a cystotome and capsulorrhexis forceps.  Balanced salt solution was used to hydrodissect and hydrodelineate the nucleus.   Phacoemulsification was then used in stop and chop fashion to remove the lens nucleus and epinucleus.  The remaining cortex was then removed using the irrigation and aspiration handpiece. Healon was then placed into the capsular bag to distend it for lens placement.  A lens was then injected into the capsular bag.    The malyugin ring was removed. The remaining viscoelastic was aspirated.   Intracameral vigamox 0.1 mL undiluted was injected into the eye and a drop placed onto the ocular surface. Wounds were hydrated with balanced salt solution.  The anterior chamber was inflated to a physiologic pressure with balanced salt solution.   Calipers were used to mark 3.5 mm posterior to the limbus in the inferotemporal quadrant.  0.1 mL of Kenalog 40 mg/mL was injected into the vitreous cavity.  No wound leaks were noted.  The patient was taken to the recovery room in stable condition without complications of anesthesia or surgery  Willey Blade 05/02/2021, 12:27 PM

## 2021-05-02 NOTE — Anesthesia Procedure Notes (Signed)
Procedure Name: MAC Date/Time: 05/02/2021 11:52 AM Performed by: Cameron Ali, CRNA Pre-anesthesia Checklist: Patient identified, Emergency Drugs available, Suction available, Timeout performed and Patient being monitored Patient Re-evaluated:Patient Re-evaluated prior to induction Oxygen Delivery Method: Nasal cannula Placement Confirmation: positive ETCO2

## 2021-05-02 NOTE — H&P (Signed)
[facility]   Primary Care Physician:  Donnie Coffin, MD Ophthalmologist: Dr. Benay Pillow  Pre-Procedure History & Physical: HPI:  Sean [name] is a 55 y.o. male here for cataract surgery.   Past Medical History:  Diagnosis Date   Diabetes mellitus without complication (HCC)    HLD (hyperlipidemia)    Hypertension    Mentally disabled     Past Surgical History:  Procedure Laterality Date   THORACOTOMY Left 09/28/2015   Procedure: THORACOTOMY MAJOR WITH DECORTICATION;  Surgeon: Nestor Lewandowsky, MD;  Location: [facility];  Service: Thoracic;  Laterality: Left;   VIDEO BRONCHOSCOPY N/A 09/28/2015   Procedure: VIDEO BRONCHOSCOPY;  Surgeon: Nestor Lewandowsky, MD;  Location: [facility];  Service: Thoracic;  Laterality: N/A;    Prior to Admission medications   Medication Sig Start Date End Date Taking? Authorizing Provider  atorvastatin (LIPITOR) 20 MG tablet Take 20 mg by mouth every evening.   Yes [provider]  glucose monitoring kit (FREESTYLE) monitoring kit 1 each by Does not apply route as needed for other.   Yes [provider]  insulin glargine (LANTUS) 100 UNIT/ML injection Inject 36 Units into the skin at bedtime.   Yes [provider]  liraglutide (VICTOZA) 18 MG/3ML SOPN Inject 1.8 mg into the skin daily.   Yes [provider]  lisinopril (PRINIVIL,ZESTRIL) 10 MG tablet Take 10 mg by mouth daily.   Yes [provider]  metFORMIN (GLUCOPHAGE) 1000 MG tablet Take 1,000 mg by mouth 2 (two) times daily with a meal.   Yes [provider]  multivitamin-iron-minerals-folic acid (CENTRUM) chewable tablet Chew 1 tablet by mouth daily.   Yes [provider]  insulin lispro (HUMALOG) 100 UNIT/ML injection Inject into the skin daily. Patient not taking: Reported on 05/02/2021    [provider]  oxyCODONE-acetaminophen (PERCOCET/ROXICET) 5-325 MG tablet Take 1 tablet by mouth every 6 (six) hours as needed for moderate  pain. Patient not taking: Reported on 04/20/2021 10/25/15   Dustin Flock, MD    Allergies as of 03/28/2021 - Review Complete 11/23/2015  Allergen Reaction Noted   Penicillins Other (See Comments) 09/21/2015    Family History  Problem Relation Age of Onset   Hypertension Mother    Diabetes Mother    Hypertension Father    Diabetes Father     Social History   Socioeconomic History   Marital status: Single    Spouse name: Not on file   Number of children: Not on file   Years of education: Not on file   Highest education level: Not on file  Occupational History   Not on file  Tobacco Use   Smoking status: Never   Smokeless tobacco: Never   Tobacco comments:    no passive smoke in home  Vaping Use   Vaping Use: Never used  Substance and Sexual Activity   Alcohol use: No    Alcohol/week: 0.0 standard drinks   Drug use: No   Sexual activity: Not on file  Other Topics Concern   Not on file  Social History Narrative   Not on file   Social Determinants of Health   Financial Resource Strain: Not on file  Food Insecurity: Not on file  Transportation Needs: Not on file  Physical Activity: Not on file  Stress: Not on file  Social Connections: Not on file  Intimate Partner Violence: Not on file    Review of Systems: See HPI, otherwise negative ROS  Physical Exam: Ht 5'  6" (1.676 m)   Wt 70.3 kg   BMI 25.02 kg/m  General:   Alert, cooperative in NAD Head:  Normocephalic and atraumatic. Respiratory:  Normal work of breathing. Cardiovascular:  RRR  Impression/Plan: Sean [name] is here for cataract surgery.  Risks, benefits, limitations, and alternatives regarding cataract surgery have been reviewed with the patient.  Questions have been answered.  All parties agreeable.   Benay Pillow, MD  05/02/2021, 11:07 AM

## 2021-05-03 ENCOUNTER — Encounter: Payer: Self-pay | Admitting: Ophthalmology

## 2021-05-09 ENCOUNTER — Encounter: Payer: Self-pay | Admitting: Ophthalmology

## 2021-05-11 NOTE — Discharge Instructions (Signed)

## 2021-05-16 ENCOUNTER — Ambulatory Visit: Payer: Medicaid Other | Admitting: Anesthesiology

## 2021-05-16 ENCOUNTER — Ambulatory Visit
Admission: RE | Admit: 2021-05-16 | Discharge: 2021-05-16 | Disposition: A | Discharge status: Home / Self Care | Payer: Medicaid Other | Attending: Ophthalmology | Admitting: Ophthalmology

## 2021-05-16 ENCOUNTER — Encounter
Admission: RE | Disposition: A | Discharge status: Home / Self Care | Payer: Self-pay | Source: Home / Self Care | Attending: Ophthalmology

## 2021-05-16 ENCOUNTER — Other Ambulatory Visit: Payer: Self-pay

## 2021-05-16 ENCOUNTER — Encounter: Payer: Self-pay | Admitting: Ophthalmology

## 2021-05-16 DIAGNOSIS — E113212 Type 2 diabetes mellitus with mild nonproliferative diabetic retinopathy with macular edema, left eye: Secondary | ICD-10-CM | POA: Insufficient documentation

## 2021-05-16 DIAGNOSIS — F79 Unspecified intellectual disabilities: Secondary | ICD-10-CM | POA: Diagnosis not present

## 2021-05-16 DIAGNOSIS — I10 Essential (primary) hypertension: Secondary | ICD-10-CM | POA: Diagnosis not present

## 2021-05-16 DIAGNOSIS — E785 Hyperlipidemia, unspecified: Secondary | ICD-10-CM | POA: Insufficient documentation

## 2021-05-16 DIAGNOSIS — E1136 Type 2 diabetes mellitus with diabetic cataract: Secondary | ICD-10-CM | POA: Diagnosis present

## 2021-05-16 DIAGNOSIS — H2512 Age-related nuclear cataract, left eye: Secondary | ICD-10-CM | POA: Diagnosis not present

## 2021-05-16 HISTORY — PX: CATARACT EXTRACTION W/PHACO: SHX586

## 2021-05-16 LAB — GLUCOSE, CAPILLARY
Glucose-Capillary: 101 mg/dL — ABNORMAL HIGH (ref 70–99)
Glucose-Capillary: 86 mg/dL (ref 70–99)

## 2021-05-16 SURGERY — PHACOEMULSIFICATION, CATARACT, WITH IOL INSERTION
Anesthesia: Monitor Anesthesia Care | Site: Eye | Laterality: Left

## 2021-05-16 MED ORDER — ACETAMINOPHEN 325 MG PO TABS: 325.0000 mg | ORAL_TABLET | ORAL | Status: DC | PRN

## 2021-05-16 MED ORDER — LACTATED RINGERS IV SOLN: INTRAVENOUS | Status: DC

## 2021-05-16 MED ORDER — SIGHTPATH DOSE#1 SODIUM HYALURONATE 10 MG/ML IO SOLUTION
PREFILLED_SYRINGE | INTRAOCULAR | Status: DC | PRN
  Administered 2021-05-16: 0.85 mL via INTRAOCULAR

## 2021-05-16 MED ORDER — ARMC OPHTHALMIC DILATING DROPS
1.0000 "application " | OPHTHALMIC | Status: DC | PRN
  Administered 2021-05-16 (×3): 1 via OPHTHALMIC

## 2021-05-16 MED ORDER — ONDANSETRON HCL 4 MG/2ML IJ SOLN: 4.0000 mg | Freq: Once | INTRAMUSCULAR | Status: DC | PRN

## 2021-05-16 MED ORDER — ACETAMINOPHEN 160 MG/5ML PO SOLN: 325.0000 mg | ORAL | Status: DC | PRN

## 2021-05-16 MED ORDER — MOXIFLOXACIN HCL 0.5 % OP SOLN
OPHTHALMIC | Status: DC | PRN
  Administered 2021-05-16: 0.2 mL via OPHTHALMIC

## 2021-05-16 MED ORDER — GLYCOPYRROLATE 0.2 MG/ML IJ SOLN
INTRAMUSCULAR | Status: DC | PRN
  Administered 2021-05-16: .1 mg via INTRAVENOUS

## 2021-05-16 MED ORDER — SIGHTPATH DOSE#1 SODIUM HYALURONATE 23 MG/ML IO SOLUTION
PREFILLED_SYRINGE | INTRAOCULAR | Status: DC | PRN
  Administered 2021-05-16: 0.6 mL via INTRAOCULAR

## 2021-05-16 MED ORDER — EPHEDRINE SULFATE 50 MG/ML IJ SOLN
INTRAMUSCULAR | Status: DC | PRN
  Administered 2021-05-16 (×2): 10 mg via INTRAVENOUS
  Administered 2021-05-16: 5 mg via INTRAVENOUS
  Administered 2021-05-16: 10 mg via INTRAVENOUS
  Administered 2021-05-16: 5 mg via INTRAVENOUS

## 2021-05-16 MED ORDER — SIGHTPATH DOSE#1 BSS IO SOLN
INTRAOCULAR | Status: DC | PRN
  Administered 2021-05-16: 15 mL

## 2021-05-16 MED ORDER — LIDOCAINE HCL (PF) 2 % IJ SOLN
INTRAOCULAR | Status: DC | PRN
  Administered 2021-05-16: 12:00:00 1 mL via INTRAOCULAR

## 2021-05-16 MED ORDER — MIDAZOLAM HCL 5 MG/5ML IJ SOLN
INTRAMUSCULAR | Status: DC | PRN
  Administered 2021-05-16: 1 mg via INTRAVENOUS

## 2021-05-16 MED ORDER — DEXAMETHASONE SODIUM PHOSPHATE 4 MG/ML IJ SOLN
INTRAMUSCULAR | Status: DC | PRN
  Administered 2021-05-16: 4 mg via INTRAVENOUS

## 2021-05-16 MED ORDER — ONDANSETRON HCL 4 MG/2ML IJ SOLN
INTRAMUSCULAR | Status: DC | PRN
  Administered 2021-05-16: 4 mg via INTRAVENOUS

## 2021-05-16 MED ORDER — TRIAMCINOLONE ACETONIDE 40 MG/ML IJ SUSP
INTRAMUSCULAR | Status: DC | PRN
  Administered 2021-05-16: .1 mL

## 2021-05-16 MED ORDER — PROPOFOL 10 MG/ML IV BOLUS
INTRAVENOUS | Status: DC | PRN
  Administered 2021-05-16: 120 mg via INTRAVENOUS

## 2021-05-16 MED ORDER — SIGHTPATH DOSE#1 BSS IO SOLN
INTRAOCULAR | Status: DC | PRN
  Administered 2021-05-16: 12:00:00 70 mL via OPHTHALMIC

## 2021-05-16 MED ORDER — TETRACAINE HCL 0.5 % OP SOLN
1.0000 [drp] | OPHTHALMIC | Status: DC | PRN
  Administered 2021-05-16 (×3): 1 [drp] via OPHTHALMIC

## 2021-05-16 SURGICAL SUPPLY — 14 items
CANNULA ANT/CHMB 27GA (MISCELLANEOUS) ×2 IMPLANT
DISSECTOR HYDRO NUCLEUS 50X22 (MISCELLANEOUS) ×2 IMPLANT
GLOVE SURG GAMMEX PI TX LF 7.5 (GLOVE) ×2 IMPLANT
GLOVE SURG SYN 8.5  E (GLOVE) ×1
GLOVE SURG SYN 8.5 E (GLOVE) ×1 IMPLANT
GOWN STRL REUS W/ TWL LRG LVL3 (GOWN DISPOSABLE) ×2 IMPLANT
GOWN STRL REUS W/TWL LRG LVL3 (GOWN DISPOSABLE) ×4
LENS IOL TECNIS EYHANCE 22.5 (Intraocular Lens) ×2 IMPLANT
PACK EYE AFTER SURG (MISCELLANEOUS) ×2 IMPLANT
RING MALYGIN (MISCELLANEOUS) ×2 IMPLANT
SYR 3ML LL SCALE MARK (SYRINGE) ×2 IMPLANT
SYR TB 1ML LUER SLIP (SYRINGE) ×2 IMPLANT
WATER STERILE IRR 250ML POUR (IV SOLUTION) ×2 IMPLANT
WIPE NON LINTING 3.25X3.25 (MISCELLANEOUS) ×2 IMPLANT

## 2021-05-16 NOTE — Anesthesia Preprocedure Evaluation (Addendum)
Anesthesia Evaluation  Patient identified by MRN, date of birth, ID band Patient awake    Reviewed: NPO status   History of Anesthesia Complications Negative for: history of anesthetic complications  Airway Mallampati: II  TM Distance: >3 FB Neck ROM: full    Dental no notable dental hx.    Pulmonary  Hx empyema drainage 2017   Pulmonary exam normal        Cardiovascular Exercise Tolerance: Good hypertension, Normal cardiovascular exam     Neuro/Psych  Intellectual disability;  Cognitive impairment. Translated Urdu by pt's brother.    GI/Hepatic   Endo/Other  diabetes  Renal/GU      Musculoskeletal   Abdominal   Peds  Hematology   Anesthesia Other Findings Last PCP (Dr. Letta Pate) visit 02/02/21 [facility]). Paper copy of note on physical chart. Issues addressed: - Poorly controlled DM - HTN (147/65, 126/65) - HLD;  Mentally disabled; Freddie Breech Urdu by pt's brother. Hx from pt's brother.  Reproductive/Obstetrics                             Anesthesia Physical  Anesthesia Plan  ASA: 3  Anesthesia Plan: General   Post-op Pain Management:    Induction:   PONV Risk Score and Plan: 2 and Dexamethasone and Ondansetron  Airway Management Planned: LMA  Additional Equipment:   Intra-op Plan:   Post-operative Plan:   Informed Consent: I have reviewed the patients History and Physical, chart, labs and discussed the procedure including the risks, benefits and alternatives for the proposed anesthesia with the patient or authorized representative who has indicated his/her understanding and acceptance.       Plan Discussed with: CRNA  Anesthesia Plan Comments: (Planned GA with LMA. It was difficult to sedate last time - cooperation was difficult due to language barrier and mental disability.)       Anesthesia Quick Evaluation

## 2021-05-16 NOTE — H&P (Signed)
[facility]   Primary Care Physician:  Donnie Coffin, MD Ophthalmologist: Dr. Benay Pillow  Pre-Procedure History & Physical: HPI:  Sean [name] is a 55 y.o. male here for cataract surgery.   Past Medical History:  Diagnosis Date   Diabetes mellitus without complication ([place])    HLD (hyperlipidemia)    Hypertension    Mentally disabled     Past Surgical History:  Procedure Laterality Date   CATARACT EXTRACTION W/PHACO Right 05/02/2021   Procedure: CATARACT EXTRACTION PHACO AND INTRAOCULAR LENS PLACEMENT ([place]) RIGHT INTRAVITREAL KENALOG INJ DIABETIC;  Surgeon: Eulogio Bear, MD;  Location: [place];  Service: Ophthalmology;  Laterality: Right;  Diabetic needs to be last 2.65 00:25.0   THORACOTOMY Left 09/28/2015   Procedure: THORACOTOMY MAJOR WITH DECORTICATION;  Surgeon: Nestor Lewandowsky, MD;  Location: [facility];  Service: Thoracic;  Laterality: Left;   VIDEO BRONCHOSCOPY N/A 09/28/2015   Procedure: VIDEO BRONCHOSCOPY;  Surgeon: Nestor Lewandowsky, MD;  Location: [facility];  Service: Thoracic;  Laterality: N/A;    Prior to Admission medications   Medication Sig Start Date End Date Taking? Authorizing Provider  atorvastatin (LIPITOR) 20 MG tablet Take 20 mg by mouth every evening.   Yes [provider]  insulin glargine (LANTUS) 100 UNIT/ML injection Inject 36 Units into the skin at bedtime.   Yes [provider]  insulin lispro (HUMALOG) 100 UNIT/ML injection Inject into the skin daily.   Yes [provider]  liraglutide (VICTOZA) 18 MG/3ML SOPN Inject 1.8 mg into the skin daily.   Yes [provider]  lisinopril (PRINIVIL,ZESTRIL) 10 MG tablet Take 10 mg by mouth daily.   Yes [provider]  metFORMIN (GLUCOPHAGE) 1000 MG tablet Take 1,000 mg by mouth 2 (two) times daily with a meal.   Yes [provider]  multivitamin-iron-minerals-folic acid (CENTRUM) chewable tablet Chew 1 tablet by mouth daily.   Yes [provider]  glucose monitoring kit (FREESTYLE) monitoring kit 1 each by Does not apply route as needed for other.    [provider]  oxyCODONE-acetaminophen (PERCOCET/ROXICET) 5-325 MG tablet Take 1 tablet by mouth every 6 (six) hours as needed for moderate pain. Patient not taking: Reported on 04/20/2021 10/25/15   Dustin Flock, MD    Allergies as of 03/28/2021 - Review Complete 11/23/2015  Allergen Reaction Noted   Penicillins Other (See Comments) 09/21/2015    Family History  Problem Relation Age of Onset   Hypertension Mother    Diabetes Mother    Hypertension Father    Diabetes Father     Social History   Socioeconomic History   Marital status: Single    Spouse name: Not on file   Number of children: Not on file   Years of education: Not on file   Highest education level: Not on file  Occupational History   Not on file  Tobacco Use   Smoking status: Never   Smokeless tobacco: Never   Tobacco comments:    no passive smoke in home  Vaping Use   Vaping Use: Never used  Substance and Sexual Activity   Alcohol use: No    Alcohol/week: 0.0 standard drinks   Drug use: No   Sexual activity: Not on file  Other Topics Concern   Not on file  Social History Narrative   Not on file   Social Determinants of Health   Financial Resource Strain: Not on file  Food Insecurity: Not on file  Transportation Needs: Not  on file  Physical Activity: Not on file  Stress: Not on file  Social Connections: Not on file  Intimate Partner Violence: Not on file    Review of Systems: See HPI, otherwise negative ROS  Physical Exam: BP (!) 169/82   Pulse 87   Temp 97.9 F (36.6 C) (Temporal)   Resp 18   Ht _0  (1.676 m)   Wt 70.3 kg   SpO2 100%   BMI 25.01 kg/m  General:   Alert, cooperative in NAD Head:  Normocephalic and atraumatic. Respiratory:  Normal work of breathing. Cardiovascular:  RRR  Impression/Plan: Sean [name] is here for cataract  surgery.  Risks, benefits, limitations, and alternatives regarding cataract surgery have been reviewed with the patient.  Questions have been answered.  All parties agreeable.   Benay Pillow, MD  05/16/2021, 11:13 AM

## 2021-05-16 NOTE — Anesthesia Postprocedure Evaluation (Signed)
Anesthesia Post Note  Patient: Sean [name]  Procedure(s) Performed: CATARACT EXTRACTION PHACO AND INTRAOCULAR LENS PLACEMENT (IOC) LEFT INTRAVITREAL KENALOG INJECTION DIABETIC (Left: Eye)     Patient location during evaluation: PACU Anesthesia Type: MAC Level of consciousness: awake Pain management: pain level controlled Vital Signs Assessment: post-procedure vital signs reviewed and stable Respiratory status: respiratory function stable Cardiovascular status: stable Postop Assessment: no signs of nausea or vomiting Anesthetic complications: no   No notable events documented.  Veda Canning

## 2021-05-16 NOTE — Transfer of Care (Signed)
Immediate Anesthesia Transfer of Care Note  Patient: Sean [name]  Procedure(s) Performed: CATARACT EXTRACTION PHACO AND INTRAOCULAR LENS PLACEMENT (IOC) LEFT INTRAVITREAL KENALOG INJECTION DIABETIC (Left: Eye)  Patient Location: PACU  Anesthesia Type: MAC  Level of Consciousness: awake, alert  and patient cooperative  Airway and Oxygen Therapy: Patient Spontanous Breathing and Patient connected to supplemental oxygen  Post-op Assessment: Post-op Vital signs reviewed, Patient's Cardiovascular Status Stable, Respiratory Function Stable, Patent Airway and No signs of Nausea or vomiting  Post-op Vital Signs: Reviewed and stable  Complications: No notable events documented.

## 2021-05-16 NOTE — Anesthesia Procedure Notes (Signed)
Procedure Name: LMA Insertion Date/Time: 05/16/2021 11:26 AM Performed by: Jimmy Picket, CRNA Pre-anesthesia Checklist: Patient identified, Emergency Drugs available, Suction available, Timeout performed and Patient being monitored Patient Re-evaluated:Patient Re-evaluated prior to induction Oxygen Delivery Method: Circle system utilized Preoxygenation: Pre-oxygenation with 100% oxygen Induction Type: IV induction LMA: LMA inserted LMA Size: 4.0 Number of attempts: 1 Placement Confirmation: positive ETCO2 and breath sounds checked- equal and bilateral Tube secured with: Tape

## 2021-05-16 NOTE — Op Note (Signed)
OPERATIVE NOTE  Sean [name] [id number] 05/16/2021   PREOPERATIVE DIAGNOSIS:   Nuclear sclerotic cataract left eye.  H25.12 A35.5732 Mild nonproliferative diabetic retinopathy with macular edema, left eye.   POSTOPERATIVE DIAGNOSIS:    same.    PROCEDURE:    CPT [phone] Complex Phacoemusification with posterior chamber intraocular lens placement of the left eye, requiring malyugin ring for dilation of the pupil. CPT [id number] Intravitreal injection of kenalog, left eye.  LENS:   Implant Name Type Inv. Item Serial No. Manufacturer Lot No. LRB No. Used Action  LENS IOL TECNIS EYHANCE 22.5 - [id number] Intraocular Lens LENS IOL TECNIS EYHANCE 22.5 [id number] JOHNSON   Left 1 Implanted      Procedure(s) with comments: CATARACT EXTRACTION PHACO AND INTRAOCULAR LENS PLACEMENT (IOC) LEFT INTRAVITREAL KENALOG INJECTION DIABETIC (Left) - Diabetic needs to be last 3.31 00:35.2  DIB00 +22.5  SURGEON:  Willey Blade, MD, MPH   ANESTHESIA:  LMA, general, with tetracaine drops augmented with 1% preservative-free intracameral lidocaine.  ESTIMATED BLOOD LOSS: <1 mL   COMPLICATIONS:  None.   DESCRIPTION OF PROCEDURE:  The patient was identified in the holding room and transported to the operating room and placed in the supine position under the operating microscope.  The left eye was identified as the operative eye and it was prepped and draped in the usual sterile ophthalmic fashion.   A 1.0 millimeter clear-corneal paracentesis was made at the 5:00 position. 0.5 ml of preservative-free 1% lidocaine with epinephrine was injected into the anterior chamber.  The anterior chamber was filled with Healon 5 viscoelastic.  A 2.4 millimeter keratome was used to make a near-clear corneal incision at the 2:00 position.    The pupil was small so a malyugin ring, 6.25 was placed without difficulty.  A curvilinear capsulorrhexis was made with a cystotome and capsulorrhexis forceps.  Balanced salt solution  was used to hydrodissect and hydrodelineate the nucleus.   Phacoemulsification was then used in stop and chop fashion to remove the lens nucleus and epinucleus.  The remaining cortex was then removed using the irrigation and aspiration handpiece. Healon was then placed into the capsular bag to distend it for lens placement.  A lens was then injected into the capsular bag.  The malyugin ring was removed.  The remaining viscoelastic was aspirated.   Wounds were hydrated with balanced salt solution.  The anterior chamber was inflated to a physiologic pressure with balanced salt solution.  3.5 mm posterior to the limbus in the inferotemporal quadrant.  0.1 mL of Kenalog 40 mg/mL was injected into the vitreous cavity.  Some kenalog was observed   Intracameral vigamox 0.1 mL undiltued was injected into the eye and a drop placed onto the ocular surface.  No wound leaks were noted.  The patient was taken to the recovery room in stable condition without complications of anesthesia or surgery  Willey Blade 05/16/2021, 11:50 AM

## 2021-05-17 ENCOUNTER — Encounter: Payer: Self-pay | Admitting: Ophthalmology

## 2021-06-23 ENCOUNTER — Other Ambulatory Visit: Payer: Self-pay | Admitting: Neurology

## 2021-06-23 ENCOUNTER — Other Ambulatory Visit (HOSPITAL_COMMUNITY): Payer: Self-pay | Admitting: Neurology

## 2021-06-23 DIAGNOSIS — R4182 Altered mental status, unspecified: Secondary | ICD-10-CM

## 2021-06-30 ENCOUNTER — Ambulatory Visit: Admission: RE | Admit: 2021-06-30 | Payer: Medicaid Other | Source: Ambulatory Visit

## 2021-07-04 ENCOUNTER — Other Ambulatory Visit: Payer: Medicaid Other

## 2021-07-20 ENCOUNTER — Ambulatory Visit
Admission: RE | Admit: 2021-07-20 | Discharge: 2021-07-20 | Disposition: A | Discharge status: Home / Self Care | Payer: Medicaid Other | Source: Ambulatory Visit | Attending: Neurology | Admitting: Neurology

## 2021-07-20 DIAGNOSIS — R4182 Altered mental status, unspecified: Secondary | ICD-10-CM | POA: Insufficient documentation

## 2024-04-05 ENCOUNTER — Emergency Department
Admission: EM | Admit: 2024-04-05 | Discharge: 2024-04-05 | Disposition: A | Discharge status: Home / Self Care | Payer: MEDICAID | Attending: Emergency Medicine | Admitting: Emergency Medicine

## 2024-04-05 ENCOUNTER — Other Ambulatory Visit: Payer: Self-pay

## 2024-04-05 ENCOUNTER — Emergency Department: Payer: MEDICAID

## 2024-04-05 DIAGNOSIS — R1013 Epigastric pain: Secondary | ICD-10-CM | POA: Diagnosis not present

## 2024-04-05 DIAGNOSIS — E119 Type 2 diabetes mellitus without complications: Secondary | ICD-10-CM | POA: Insufficient documentation

## 2024-04-05 DIAGNOSIS — R197 Diarrhea, unspecified: Secondary | ICD-10-CM | POA: Insufficient documentation

## 2024-04-05 DIAGNOSIS — R110 Nausea: Secondary | ICD-10-CM | POA: Diagnosis not present

## 2024-04-05 DIAGNOSIS — R109 Unspecified abdominal pain: Secondary | ICD-10-CM | POA: Diagnosis present

## 2024-04-05 LAB — URINALYSIS, ROUTINE W REFLEX MICROSCOPIC
Bacteria, UA: NONE SEEN
Bilirubin Urine: NEGATIVE
Glucose, UA: >=500 mg/dL — AB
Hgb urine dipstick: NEGATIVE
Ketones, ur: NEGATIVE mg/dL
Leukocytes,Ua: NEGATIVE
Nitrite: NEGATIVE
Protein, ur: NEGATIVE mg/dL
RBC / HPF: 0 RBC/hpf (ref 0–5)
Specific Gravity, Urine: 1.025 (ref 1.005–1.030)
Squamous Epithelial / HPF: 0 /HPF (ref 0–5)
WBC, UA: 0 WBC/hpf (ref 0–5)
pH: 5 (ref 5.0–8.0)

## 2024-04-05 LAB — CBC
HCT: 39.6 % (ref 39.0–52.0)
Hemoglobin: 12.8 g/dL — ABNORMAL LOW (ref 13.0–17.0)
MCH: 29.4 pg (ref 26.0–34.0)
MCHC: 32.3 g/dL (ref 30.0–36.0)
MCV: 91 fL (ref 80.0–100.0)
Platelets: 266 K/uL (ref 150–400)
RBC: 4.35 MIL/uL (ref 4.22–5.81)
RDW: 12.7 % (ref 11.5–15.5)
WBC: 5.9 K/uL (ref 4.0–10.5)
nRBC: 0 % (ref 0.0–0.2)

## 2024-04-05 LAB — COMPREHENSIVE METABOLIC PANEL WITH GFR
ALT: 42 U/L (ref 0–44)
AST: 51 U/L — ABNORMAL HIGH (ref 15–41)
Albumin: 4.4 g/dL (ref 3.5–5.0)
Alkaline Phosphatase: 64 U/L (ref 38–126)
Anion gap: 13 (ref 5–15)
BUN: 38 mg/dL — ABNORMAL HIGH (ref 6–20)
CO2: 19 mmol/L — ABNORMAL LOW (ref 22–32)
Calcium: 9.6 mg/dL (ref 8.9–10.3)
Chloride: 102 mmol/L (ref 98–111)
Creatinine, Ser: 1.24 mg/dL (ref 0.61–1.24)
GFR, Estimated: >60 mL/min (ref >60)
Glucose, Bld: 197 mg/dL — ABNORMAL HIGH (ref 70–99)
Potassium: 4.6 mmol/L (ref 3.5–5.1)
Sodium: 134 mmol/L — ABNORMAL LOW (ref 135–145)
Total Bilirubin: 0.5 mg/dL (ref 0.0–1.2)
Total Protein: 8 g/dL (ref 6.5–8.1)

## 2024-04-05 LAB — MAGNESIUM: Magnesium: 2.1 mg/dL (ref 1.7–2.4)

## 2024-04-05 LAB — CBG MONITORING, ED: Glucose-Capillary: 171 mg/dL — ABNORMAL HIGH (ref 70–99)

## 2024-04-05 LAB — LIPASE, BLOOD: Lipase: 70 U/L — ABNORMAL HIGH (ref 11–51)

## 2024-04-05 MED ORDER — METOCLOPRAMIDE HCL 5 MG/ML IJ SOLN
10.0000 mg | Freq: Once | INTRAMUSCULAR | Status: AC
  Administered 2024-04-05: 10 mg via INTRAVENOUS
  Filled 2024-04-05: qty 2

## 2024-04-05 MED ORDER — IOHEXOL 300 MG/ML  SOLN
100.0000 mL | Freq: Once | INTRAMUSCULAR | Status: AC | PRN
  Administered 2024-04-05: 100 mL via INTRAVENOUS

## 2024-04-05 MED ORDER — SODIUM CHLORIDE 0.9 % IV BOLUS
1000.0000 mL | Freq: Once | INTRAVENOUS | Status: AC
  Administered 2024-04-05: 1000 mL via INTRAVENOUS

## 2024-04-05 MED ORDER — MORPHINE SULFATE (PF) 4 MG/ML IV SOLN
4.0000 mg | Freq: Once | INTRAVENOUS | Status: AC
  Administered 2024-04-05: 4 mg via INTRAVENOUS
  Filled 2024-04-05: qty 1

## 2024-04-05 NOTE — ED Notes (Signed)
 Pt and family given DC instructions. Family verbalized  understanding of follow up care. Pt taken from ED in wheelchair by family.

## 2024-04-05 NOTE — ED Provider Notes (Signed)
 [facility] Provider Note    Event Date/Time   First MD Initiated Contact with Patient 04/05/24 1611     (approximate)   History   Abdominal Pain   HPI  Sean [name] is a 58 year old male with history of type 2 diabetes, intellectual disability presenting to the emergency department for evaluation of abdominal pain, vomiting, diarrhea.  Patient accompanied by family who provides history.  Interpreter declined as they report that patient speech is difficult to understand other than by family members.  Family member reports that patient was started on Mounjaro 2 weeks ago.  Since that time patient has had decreased appetite with poor p.o. intake and weight loss.  He has also had nausea without vomiting as well as diarrhea.  Has had labile blood sugars sometimes up to the 300s, but but lower in the mornings around 60.  He is scheduled to be switched from Mounjaro to Ozempic, but has not yet made the switch.  Has follow-up with his primary care doctor on the 22nd.     Physical Exam   Triage Vital Signs: ED Triage Vitals  Encounter Vitals Group     BP 04/05/24 1550 132/68     Girls Systolic BP Percentile --      Girls Diastolic BP Percentile --      Boys Systolic BP Percentile --      Boys Diastolic BP Percentile --      Pulse Rate 04/05/24 1550 (!) 104     Resp 04/05/24 1550 19     Temp 04/05/24 1550 98.4 F (36.9 C)     Temp src --      SpO2 04/05/24 1550 98 %     Weight 04/05/24 1551 154 lb 5.2 oz (70 kg)     Height 04/05/24 1551 5' 6 (1.676 m)     Head Circumference --      Peak Flow --      Pain Score 04/05/24 1551 10     Pain Loc --      Pain Education --      Exclude from Growth Chart --     Most recent vital signs: Vitals:   04/05/24 1550  BP: 132/68  Pulse: (!) 104  Resp: 19  Temp: 98.4 F (36.9 C)  SpO2: 98%     General: Awake, interactive  CV:  Good peripheral perfusion Resp:  Unlabored respirations, lungs clear to  auscultation Abd:  Nondistended, soft, mild generalized tenderness to palpation, most notable in epigastric region Neuro:  Symmetric facial movement, fluid speech   ED Results / Procedures / Treatments   Labs (all labs ordered are listed, but only abnormal results are displayed) Labs Reviewed  LIPASE, BLOOD - Abnormal; Notable for the following components:      Result Value   Lipase 70 (*)    All other components within normal limits  COMPREHENSIVE METABOLIC PANEL WITH GFR - Abnormal; Notable for the following components:   Sodium 134 (*)    CO2 19 (*)    Glucose, Bld 197 (*)    BUN 38 (*)    AST 51 (*)    All other components within normal limits  CBC - Abnormal; Notable for the following components:   Hemoglobin 12.8 (*)    All other components within normal limits  URINALYSIS, ROUTINE W REFLEX MICROSCOPIC - Abnormal; Notable for the following components:   Color, Urine YELLOW (*)    APPearance CLEAR (*)    Glucose, UA >=  500 (*)    All other components within normal limits  CBG MONITORING, ED - Abnormal; Notable for the following components:   Glucose-Capillary 171 (*)    All other components within normal limits  MAGNESIUM     EKG EKG independently reviewed and interpreted by myself demonstrates:  EKG demonstrates normal sinus rhythm at a rate of 99, PR 156, QRS 80, QTc 438, no acute ST changes  RADIOLOGY Imaging independently reviewed and interpreted by myself demonstrates:  CT without acute abnormality  Formal Radiology Read:  CT ABDOMEN PELVIS W CONTRAST Result Date: 04/05/2024 EXAM: CT ABDOMEN AND PELVIS WITH CONTRAST 04/05/2024 05:27:51 PM TECHNIQUE: CT of the abdomen and pelvis was performed with the administration of 100 mL of iohexol (OMNIPAQUE) 300 MG/ML solution. Multiplanar reformatted images are provided for review. Automated exposure control, iterative reconstruction, and/or weight-based adjustment of the mA/kV was utilized to reduce the radiation dose  to as low as reasonably achievable. COMPARISON: No images are available for comparison. There is a comparison report available from CT abdomen and pelvis dated 10/14/2023. CLINICAL HISTORY: Abdominal pain, acute, nonlocalized. Patient started Mounjaro 2 weeks ago. Patient c/o abdominal pain, N/V, diarrhea. Patient reports some hypoglycemia in the am. FINDINGS: LOWER CHEST: No acute abnormality. LIVER: The liver is unremarkable. GALLBLADDER AND BILE DUCTS: Gallbladder is unremarkable. No biliary ductal dilatation. SPLEEN: No acute abnormality. PANCREAS: No acute abnormality. ADRENAL GLANDS: No acute abnormality. KIDNEYS, URETERS AND BLADDER: No stones in the kidneys or ureters. No hydronephrosis. No perinephric or periureteral stranding. Urinary bladder is unremarkable. GI AND BOWEL: Stomach demonstrates no acute abnormality. There is no bowel obstruction. PERITONEUM AND RETROPERITONEUM: No ascites. No free air. VASCULATURE: Aorta is normal in caliber. Aortic atherosclerotic calcification. LYMPH NODES: No lymphadenopathy. REPRODUCTIVE ORGANS: No acute abnormality. BONES AND SOFT TISSUES: No acute osseous abnormality. No focal soft tissue abnormality. IMPRESSION: 1. No acute abnormality in the abdomen or pelvis. 2. Aortic atherosclerosis. Electronically signed by: Norman Gatlin MD 04/05/2024 05:40 PM EDT RP Workstation: [id number]    PROCEDURES:  Critical Care performed: No  Procedures   MEDICATIONS ORDERED IN ED: Medications  metoCLOPramide  (REGLAN ) injection 10 mg (10 mg Intravenous Given 04/05/24 1649)  morphine  (PF) 4 MG/ML injection 4 mg (4 mg Intravenous Given 04/05/24 1649)  sodium chloride  0.9 % bolus 1,000 mL (1,000 mLs Intravenous New Bag/Given 04/05/24 1648)  iohexol (OMNIPAQUE) 300 MG/ML solution 100 mL (100 mLs Intravenous Contrast Given 04/05/24 1720)     IMPRESSION / MDM / ASSESSMENT AND PLAN / ED COURSE  I reviewed the triage vital signs and the nursing notes.  Differential  diagnosis includes, but is not limited to, medication adverse effect, electrolyte abnormality, dehydration, viral illness, colitis, diverticulitis, otitis, other acute intra-abdominal process  Patient's presentation is most consistent with acute presentation with potential threat to life or bodily function.  58 year old male presenting with decreased p.o. intake with nausea and diarrhea in the setting of recent initiation of GLP-1 medication.  Mild tachycardia on presentation, improved after fluids.  Labs with reassuring CBC with mild anemia, not new for patient.  CMP with mild hyperglycemia, no evidence of DKA.  Urine without evidence of infection.  CT without acute findings.  Patient treated symptomatically here with improvement.  No vomiting.  Discussed results of workup.  Patient and family comfortable with discharge home.  I did discuss that I do not recommend taking any further GLP-1 medications until the patient has his follow-up with his primary care doctor on the 22nd.  Did instruct patient  to continue taking insulin  as previously directed.  Family expressed understanding.  Strict return precautions provided.  Patient discharged in stable condition.      FINAL CLINICAL IMPRESSION(S) / ED DIAGNOSES   Final diagnoses:  Nausea  Diarrhea, unspecified type  Epigastric pain     Rx / DC Orders   ED Discharge Orders     None        Note:  This document was prepared using Dragon voice recognition software and may include unintentional dictation errors.   Levander Slate, MD 04/05/24 [phone]

## 2024-04-05 NOTE — Discharge Instructions (Signed)
 Sean [name] was seen in the ER today for evaluation of his abdominal pain with nausea and diarrhea. His blood work was overall reassuring. His CT scan did not show any acute findings. I suspect his symptoms are likely related to starting Mounjaro.  I do not recommend taking this, Ozempic, or any other GLP-1 agonists until you are able to speak with your primary care doctor.  You can continue to take your insulin  as you have been previously prescribed.  Please let your PCP know the symptoms you have been having and discussed what they recommend for further diabetes management.  Return to the ER for any new or worsening symptoms.

## 2024-04-05 NOTE — ED Triage Notes (Signed)
 Patient started Mounjaro 2 weeks ago. Patient c/o abdominal pain, N/V, diarrhea. Patient reports some hypoglycemia in the am.
